# Patient Record
Sex: Female | Born: 1950
Health system: Southern US, Community
[De-identification: ages and names within clinical notes are randomized; demographics above are authoritative.]

## PROBLEM LIST (undated history)

## (undated) DIAGNOSIS — Z789 Other specified health status: Secondary | ICD-10-CM

## (undated) DIAGNOSIS — I639 Cerebral infarction, unspecified: Secondary | ICD-10-CM

## (undated) DIAGNOSIS — Z87442 Personal history of urinary calculi: Secondary | ICD-10-CM

## (undated) DIAGNOSIS — J342 Deviated nasal septum: Secondary | ICD-10-CM

## (undated) DIAGNOSIS — D649 Anemia, unspecified: Secondary | ICD-10-CM

## (undated) DIAGNOSIS — N200 Calculus of kidney: Secondary | ICD-10-CM

## (undated) DIAGNOSIS — E119 Type 2 diabetes mellitus without complications: Secondary | ICD-10-CM

## (undated) DIAGNOSIS — R040 Epistaxis: Secondary | ICD-10-CM

## (undated) HISTORY — DX: Cerebral infarction, unspecified: I63.9

## (undated) HISTORY — PX: OTHER SURGICAL HISTORY: SHX169

## (undated) HISTORY — PX: OVARIAN CYST SURGERY: SHX726

## (undated) HISTORY — DX: Deviated nasal septum: J34.2

## (undated) HISTORY — DX: Type 2 diabetes mellitus without complications: E11.9

---

## 2003-12-26 ENCOUNTER — Emergency Department (HOSPITAL_COMMUNITY): Admission: EM | Admit: 2003-12-26 | Discharge: 2003-12-27 | Payer: Self-pay | Admitting: Emergency Medicine

## 2003-12-28 ENCOUNTER — Other Ambulatory Visit: Admission: RE | Admit: 2003-12-28 | Discharge: 2003-12-28 | Payer: Self-pay | Admitting: Obstetrics and Gynecology

## 2004-02-08 ENCOUNTER — Encounter (INDEPENDENT_AMBULATORY_CARE_PROVIDER_SITE_OTHER): Payer: Self-pay | Admitting: Specialist

## 2004-02-08 ENCOUNTER — Inpatient Hospital Stay (HOSPITAL_COMMUNITY): Admission: RE | Admit: 2004-02-08 | Discharge: 2004-02-10 | Payer: Self-pay | Admitting: Obstetrics and Gynecology

## 2008-04-23 HISTORY — PX: ANKLE SURGERY: SHX546

## 2008-08-26 ENCOUNTER — Ambulatory Visit (HOSPITAL_COMMUNITY): Admission: RE | Admit: 2008-08-26 | Discharge: 2008-08-26 | Payer: Self-pay | Admitting: Internal Medicine

## 2008-11-18 ENCOUNTER — Inpatient Hospital Stay (HOSPITAL_COMMUNITY): Admission: EM | Admit: 2008-11-18 | Discharge: 2008-11-21 | Payer: Self-pay | Admitting: Emergency Medicine

## 2010-01-17 ENCOUNTER — Emergency Department (HOSPITAL_COMMUNITY): Admission: EM | Admit: 2010-01-17 | Discharge: 2010-01-17 | Payer: Self-pay | Admitting: Emergency Medicine

## 2010-07-30 LAB — CBC
HCT: 38.5 % (ref 36.0–46.0)
Hemoglobin: 13.1 g/dL (ref 12.0–15.0)
MCHC: 34 g/dL (ref 30.0–36.0)
MCV: 87.2 fL (ref 78.0–100.0)
Platelets: 229 10*3/uL (ref 150–400)
RBC: 4.41 MIL/uL (ref 3.87–5.11)
RDW: 15.3 % (ref 11.5–15.5)
WBC: 10 10*3/uL (ref 4.0–10.5)

## 2010-07-30 LAB — URINALYSIS, ROUTINE W REFLEX MICROSCOPIC
Bilirubin Urine: NEGATIVE
Glucose, UA: NEGATIVE mg/dL
Hgb urine dipstick: NEGATIVE
Ketones, ur: NEGATIVE mg/dL
Nitrite: NEGATIVE
Protein, ur: NEGATIVE mg/dL
Specific Gravity, Urine: 1.018 (ref 1.005–1.030)
Urobilinogen, UA: 0.2 mg/dL (ref 0.0–1.0)
pH: 6 (ref 5.0–8.0)

## 2010-07-30 LAB — BASIC METABOLIC PANEL
BUN: 12 mg/dL (ref 6–23)
CO2: 23 mEq/L (ref 19–32)
Calcium: 9.3 mg/dL (ref 8.4–10.5)
Chloride: 108 mEq/L (ref 96–112)
Creatinine, Ser: 0.69 mg/dL (ref 0.4–1.2)
GFR calc Af Amer: >60 mL/min (ref >60)
GFR calc non Af Amer: >60 mL/min (ref >60)
Glucose, Bld: 164 mg/dL — ABNORMAL HIGH (ref 70–99)
Potassium: 4 mEq/L (ref 3.5–5.1)
Sodium: 138 mEq/L (ref 135–145)

## 2010-07-30 LAB — DIFFERENTIAL
Basophils Absolute: 0.1 10*3/uL (ref 0.0–0.1)
Basophils Relative: 1 % (ref 0–1)
Eosinophils Absolute: 0.2 10*3/uL (ref 0.0–0.7)
Eosinophils Relative: 2 % (ref 0–5)
Lymphocytes Relative: 15 % (ref 12–46)
Lymphs Abs: 1.5 10*3/uL (ref 0.7–4.0)
Monocytes Absolute: 0.8 10*3/uL (ref 0.1–1.0)
Monocytes Relative: 8 % (ref 3–12)
Neutro Abs: 7.4 10*3/uL (ref 1.7–7.7)
Neutrophils Relative %: 75 % (ref 43–77)

## 2010-07-30 LAB — URINE CULTURE: Colony Count: 50000

## 2010-07-30 LAB — APTT: aPTT: 28 seconds (ref 24–37)

## 2010-07-30 LAB — PROTIME-INR
INR: 0.9 (ref 0.00–1.49)
Prothrombin Time: 12.7 seconds (ref 11.6–15.2)

## 2010-09-05 NOTE — H&P (Signed)
NAMEEZELLA, KELL             ACCOUNT NO.:  [id number]   MEDICAL RECORD NO.:  [id number]          PATIENT TYPE:  INP   LOCATION:  5010                         FACILITY:  [facility]   PHYSICIAN:  Dahlia Bailiff, MD    DATE OF BIRTH:  Sep 11, 1950   DATE OF ADMISSION:  11/18/2008  DATE OF DISCHARGE:                              HISTORY & PHYSICAL   ADMITTING DIAGNOSIS:  Left ankle fracture-dislocation, trimalleolar.   HISTORY:  This is a very pleasant 60 year old woman who was in her usual  state of good-to-excellent health with previous medical history of COPD.  She was at home when she got knocked over by her dog and twisted her  ankle.  She noted immediate pain, deformity, and inability to ambulate.  She was brought to the emergency room and x-rays were done.   X-rays demonstrated trimalleolar ankle fracture dislocation.  Under  local sedation, the ER staff performed a reduction.  Per their report,  there was no evidence of any open wound.   At this point in time, an ortho consultation was requested.   Past medical history includes COPD.  She sleeps with home O2.   She has had previous left wrist surgery.   She denies drug abuse.  She quit smoking within the last 2 months.  She  socially uses alcohol.   ALLERGIES:  She is allergic to PENICILLIN.   Only medication is home oxygen.   CLINICAL EXAM:  GENERAL:  The patient is alert and orientated, in bed  with a left lower extremity splint on.  EXTREMITIES:  She is moving her toes.  Capillary refill less than 2  seconds.  Sensation to light touch is intact.  The proximal calf is soft  and nontender.  She has no knee pain.  Proximal compartment is soft and  nontender.  She has no right lower extremity symptomatology.  LUNGS:  No shortness of breath or chest pain.  ABDOMEN:  Soft, nontender.  NEUROLOGIC:  Cranial nerves II through XII were tested, they are intact.   Post-reduction x-rays demonstrated satisfactory reduction of  the ankle.  There is a small residual posterior fragment as well as the medial and  lateral malleolar fragments.   PLAN:  At this point in time, I have reviewed the x-rays with the  patient and her family and discussed treatment options.  After  discussing both surgical and nonsurgical treatment plans, she has  elected to proceed with surgery.  Risks as I have explained them to her  and her husband and daughter include infection, bleeding, nerve damage,  death, stroke, paralysis, failure to heal, need for further surgery,  ongoing or worse pain, loss of fixation.   Plan is to admit her today for elevation and ice and take her to the  operating room tomorrow for an open reduction and internal fixation.      Dahlia Bailiff, MD  Electronically Signed     DDB/MEDQ  D:  11/18/2008  T:  11/19/2008  Job:  [id number]

## 2010-09-05 NOTE — Op Note (Signed)
Tamara Thompson, Tamara Thompson             ACCOUNT NO.:  [id number]   MEDICAL RECORD NO.:  [id number]          PATIENT TYPE:  INP   LOCATION:  5010                         FACILITY:  [facility]   PHYSICIAN:  Dahlia Bailiff, MD    DATE OF BIRTH:  04/14/1951   DATE OF PROCEDURE:  DATE OF DISCHARGE:                               OPERATIVE REPORT   PREOPERATIVE DIAGNOSIS:  Left trimalleolar ankle fracture and  dislocation.   POSTOPERATIVE DIAGNOSIS:  Left trimalleolar ankle fracture and  dislocation.   OPERATIVE PROCEDURE:  Open reduction and internal fixation of left  ankle.   COMPLICATIONS:  None.   FIRST ASSISTANT:  Karen Kays, PA   HISTORY:  This is a very pleasant 60 year old woman who was knocked down  yesterday evening by her dog and twisted her ankle.  X-rays confirmed a  trimalleolar ankle fracture and dislocation.  It was reduced, splinted,  and I admitted for definitive fracture fixation.  All appropriate risks,  benefits, and alternatives were discussed and consent was obtained.   OPERATIVE NOTE:  The patient was brought to the operating room, placed  supine on the operating table.  After successful induction of general  anesthesia and endotracheal intubation, TEDs and SCDs were applied and  the tourniquet was placed on the proximal left thigh.  Appropriate  preoperative time-out was done confirming patient, procedure, and  extremity that was fractured.  Once done, the left lower extremity was  prepped and draped in a standard fashion.  The leg was exsanguinated and  the tourniquet was inflated.  Total tourniquet time was 1 hour and 48  minutes.   A lateral incision was made along the inferior aspect of the fibula.  The patient was quite thin and I was concerned about an incision  directly lateral and irritation that having the plate irritate the  subcutaneous tissue.  I sharply dissected until I was able to palpate  the posterior margin of the fibula.  I then exposed the  fracture site  and removed all the debris from it.  I excised the enfolded periosteum.  At this point, I could easily reduce the fracture.  There was  significant comminution in bony loss, but I could reduce the two major  fragments.  I then put a posterior-to-anterior lag screw.  This is a 20-  mm length screw.  This is a 3.5 drill on the near cortex and 2.5 drill  on the far cortex.  Once I had the lag screw crossed, I was able to  remove the reduction forceps and maintain an anatomical reduction.  I  then obtained the DePuy small frag locking plate, contoured it, and  secured it to the lateral aspect of the fibula with unicortical screws  distally and four proximal locking screws.  At this point once the  lateral fixation was compete, I irrigated the wound copiously with  normal saline and took intraoperative AP, lateral, and mortise views.  I  had satisfactory reduction of not only the lateral side with hardware  placement, but the posterior fragment also reduced.  I then irrigated  this wound copiously  with normal saline, then closed in a layered  fashion with 0 Vicryl suture, 2-0 Vicryl suture, and a 3-0 Prolene  vertical running mattress.  With the lateral side addressed, I then  turned my attention to the medial side.  A reverse hockey stick incision  was made and sharp dissection was carried out down to the bone.  I  sharply dissected, so I could see the axilla, the anterior margin of the  joint space.  I then again removed significant comminuted portions of  bone and excised the enfolded deltoid ligament and periosteum.  At this  point, I could reduce the fracture fragment of the medial malleolus and  placed two guide pins across it.  I had satisfactory position in AP,  lateral, and mortise views, and so I drilled and placed two screws, more  anterior, it was 46 mm in length and in the posterior, it was 50 mm in  length.  I had excellent fixation.  Clinically, direct evaluation I  had  bone-to-bone apposition.   There were areas of lucency due to bone loss in comminution.  At this  point, I took final AP, lateral, and mortise views and I had  satisfactory reduction and hardware placement in all planes.   I then closed with interrupted 2-0 Vicryl sutures and interrupted simple  Prolene sutures 2-0.  At this point, I had an excellent closure and then  cleaned the skin.  I then placed a bulky dry dressing with a posterior  splint with side struts.  The patient was extubated and transferred to  the PACU without incident.  At the end of the case, all needles and  sponge counts were correct.  The patient tolerated the procedure well.      Dahlia Bailiff, MD  Electronically Signed     DDB/MEDQ  D:  11/19/2008  T:  11/20/2008  Job:  [id number]
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
## 2010-09-08 NOTE — Op Note (Signed)
[name], Tamara Thompson             ACCOUNT NO.:  [id number]   MEDICAL RECORD NO.:  [id number]          PATIENT TYPE:  INP   LOCATION:  5409                         FACILITY:  [facility]   PHYSICIAN:  Clarene Duke, M.D.DATE OF BIRTH:  [birth year]   DATE OF PROCEDURE:  02/08/2004  DATE OF DISCHARGE:                                 OPERATIVE REPORT   PREOPERATIVE DIAGNOSIS:  Pelvic mass.   POSTOPERATIVE DIAGNOSIS:  Pelvic mass which is a right ovarian mature  teratoma.   PROCEDURE:  Exploratory laparotomy and right salpingo-oophorectomy.   SURGEON:  Cheri Fowler, MD   ASSISTANT:  Newton Pigg, MD   ANESTHESIA:  General endotracheal tube.   ESTIMATED BLOOD LOSS:  50 mL.   FINDINGS:  She had a 10 cm right ovarian mass which was a benign mature  teratoma by frozen section.  She had some small, whitish nodules on her left  ovary which were also removed.  The remainder of her anatomy was normal with  normal uterus and upper abdomen.   PROCEDURE IN DETAIL:  The patient was taken to the operating room and placed  in the dorsal supine position.  General anesthesia was induced, and she was  placed in mobile stirrups.  Abdomen, perineum, and vagina were prepped and  draped in the usual sterile fashion and a Foley catheter inserted.  Abdomen  was then entered via a vertical skin incision from just above the pubic  symphysis to about two-thirds of the way up to the umbilicus.  This was  carried down through the fascia which was incised.  Peritoneum was entered  bluntly and extended superiorly and inferiorly.  Washings were obtained.  Palpation of the upper abdomen was normal.  Palpation of the pelvis just  revealed this 10 cm mass.  Very delicately the mass was delivered through  the incision.  This did require extending the incision approximately 2-3 cm.  Once the mass was elevated, it was found to be the right ovary.  A Zepplin  clamp was used to isolate the ovary and the distal right  fallopian tube  which had some whitish nodules on it.  This was then removed sharply and  sent for frozen section.  The pedicle was doubly ligated with #1 chromic  with adequate hemostasis.  Inspection revealed two whitish nodules on the  left ovary which were removed with electrocautery and will be sent as  permanent specimens.  The remainder of her pelvic anatomy was normal.  The  pedicle was again inspected and found to be hemostatic.  The fascia and  peritoneum were then closed in bulk with running 0 PDS, starting at both  ends and meeting in the middle.  Subcutaneous tissue was made hemostatic  with electrocautery and closed with running 2-0 plain gut suture.  Skin was  then closed with staples.  While closing the skin, the frozen section came  back consistent with a benign ovarian mature teratoma.  Sterile dressing was  then  placed.  The patient tolerated the procedure well, was extubated in the  operating room and taken to the recovery room in  stable condition.  Counts  were correct x 2.  She had PAS throughout the procedure and received Mefoxin  1 g prior to the procedure.     Todd   TDM/MEDQ  D:  02/08/2004  T:  02/08/2004  Job:  [id number]

## 2010-09-08 NOTE — Discharge Summary (Signed)
NAMETREASA, Thompson             ACCOUNT NO.:  [id number]   MEDICAL RECORD NO.:  [id number]          PATIENT TYPE:  INP   LOCATION:  2376                         [facility]ACILITY:  [facility]   PHYSICIAN:  Clarene Duke, M.D.DATE OF BIRTH:  09/27/1950   DATE OF ADMISSION:  02/08/2004  DATE OF DISCHARGE:  02/10/2004                                 DISCHARGE SUMMARY   ADMISSION DIAGNOSIS:  Pelvic mass.   DISCHARGE DIAGNOSIS:  Right ovarian mature teratoma.   PROCEDURE:  On February 08, 2004, she had an exploratory laparotomy with  right salpingo-oophorectomy.   HISTORY AND PHYSICAL:  Please see chart for full history and physical, but  briefly this is a 59 year old white female, para 2-0-0-2, who is referred  from the emergency department after presenting for abdominal pain and had a  CT scan that revealed a 10 x 8 cm right ovarian mass.  Physical exam  revealed a benign abdomen, with a well-healed transverse scar from a  previous cesarean section.  On pelvic exam, she had an 8-10 cm right adnexal  mass.   HOSPITAL COURSE:  The patient was admitted on the day of surgery and  underwent exploratory laparotomy with right salpingo-oophorectomy under  general anesthesia.  Estimated blood loss was 50 cc.  She did have a 10 cm  right ovarian mass that by frozen section was a benign mature teratoma.  Postoperatively, she had no significant complications.  Preoperative  hemoglobin 15.1, postoperative 12.2.  On postoperative day two, she was felt  to be stable enough for discharge home.  Her incision was healing well.   DISCHARGE INSTRUCTIONS:  Regular diet.  No strenuous activity.   FOLLOWUP:  Is in four days for staple removal.   MEDICATIONS:  1.  Percocet, #40, 1-2 p.o. q.4-6 h. p.r.n. pain.  2.  Over-the-counter ibuprofen as needed.     Todd   TDM/MEDQ  D:  02/10/2004  T:  02/10/2004  Job:  [id number]

## 2012-02-28 ENCOUNTER — Emergency Department (HOSPITAL_COMMUNITY)
Admission: EM | Admit: 2012-02-28 | Discharge: 2012-02-28 | Disposition: A | Discharge status: Home / Self Care | Payer: Self-pay | Attending: Emergency Medicine | Admitting: Emergency Medicine

## 2012-02-28 ENCOUNTER — Encounter (HOSPITAL_COMMUNITY): Payer: Self-pay

## 2012-02-28 DIAGNOSIS — Z87891 Personal history of nicotine dependence: Secondary | ICD-10-CM | POA: Insufficient documentation

## 2012-02-28 DIAGNOSIS — R040 Epistaxis: Secondary | ICD-10-CM | POA: Insufficient documentation

## 2012-02-28 HISTORY — DX: Epistaxis: R04.0

## 2012-02-28 MED ORDER — OXYMETAZOLINE HCL 0.05 % NA SOLN
NASAL | Status: AC
  Administered 2012-02-28: 1 via NASAL
  Filled 2012-02-28: qty 15

## 2012-02-28 MED ORDER — OXYMETAZOLINE HCL 0.05 % NA SOLN
1.0000 | Freq: Once | NASAL | Status: AC
  Administered 2012-02-28: 1 via NASAL

## 2012-02-28 NOTE — ED Notes (Signed)
Pt states nose bleed x90 minutes. At this time pt advised to hold pressure on nose. No active bleeding noted on my initial assessment. Pt states nose had been continuously bleeding up until I came into room. No other complaints, denies any blood thinning or antiplatelet medications.

## 2012-02-28 NOTE — ED Notes (Signed)
Nose started bleeding over an hour ago per pt. Bleeding from the right side of nose. History of nosebleeds, not on blood thinners per pt.

## 2012-02-28 NOTE — ED Provider Notes (Signed)
History  This chart was scribed for Veryl Speak, MD by Mikel Cella. This patient was seen in room APA19/APA19 and the patient's care was started at 2007.  CSN: [id number]  Arrival date & time 02/28/12  1955   First MD Initiated Contact with Patient 02/28/12 2007      Chief Complaint  Patient presents with  . Epistaxis     The history is provided by the patient. No language interpreter was used.    Tamara Thompson is a 61 y.o. female who presents to the Emergency Department complaining of epistaxis from her right nostral. She states that the bleeding began an hour and a half ago when she was watching TV and it stopped 15 minutes before she arrived at the ED. She denies any injury or trauma to the area. She denies taking blood thinners. She has a history of epistaxis, but states that none of her previous episodes have been this severe. She is a former smoker but denies alcohol use.    Past Medical History  Diagnosis Date  . Bleeding from the nose     Past Surgical History  Procedure Date  . Ovarian cyst surgery   . Ankle surgery     No family history on file.  History  Substance Use Topics  . Smoking status: Former Research scientist (life sciences)  . Smokeless tobacco: Not on file  . Alcohol Use: No   No OB history available.   Review of Systems  All other systems reviewed and are negative.  A complete 10 system review of systems was obtained and all systems are negative except as noted in the HPI and PMH.    Allergies  Penicillins  Home Medications  No current outpatient prescriptions on file.  Triage Vitals: BP 152/98  Pulse 131  Ht 5' 5"  (1.651 m)  Wt 175 lb (79.379 kg)  BMI 29.12 kg/m2  SpO2 100%  Physical Exam  Nursing note and vitals reviewed. Constitutional: She is oriented to person, place, and time. She appears well-developed and well-nourished. No distress.  HENT:  Head: Normocephalic and atraumatic.       Dried blood in right naris, no active bleed. Septum  midline, mucosa normal  Eyes: EOM are normal. Pupils are equal, round, and reactive to light.  Neck: Normal range of motion. Neck supple. No tracheal deviation present.  Cardiovascular: Normal rate.   Pulmonary/Chest: Effort normal. No respiratory distress.  Abdominal: Soft. She exhibits no distension.  Musculoskeletal: Normal range of motion. She exhibits no edema.  Neurological: She is alert and oriented to person, place, and time.  Skin: Skin is warm and dry.  Psychiatric: She has a normal mood and affect. Her behavior is normal.    ED Course  Procedures (including critical care time)  DIAGNOSTIC STUDIES: Oxygen Saturation is 100% on room air, normal by my interpretation.    COORDINATION OF CARE:  8:19 PM: Discussed treatment plan which includes a vasoconstrictor with pt at bedside and pt agreed to plan.    Labs Reviewed - No data to display No results found.   No diagnosis found.    MDM  Patient given oxymetazoline nasal spray and no further bleeding has occurred.  Will discharge to home, to return prn if worsens.      I personally performed the services described in this documentation, which was scribed in my presence. The recorded information has been reviewed and is accurate.     Veryl Speak, MD 02/28/12 2105

## 2013-04-12 ENCOUNTER — Encounter (HOSPITAL_COMMUNITY): Payer: Self-pay | Admitting: Emergency Medicine

## 2013-04-12 ENCOUNTER — Emergency Department (HOSPITAL_COMMUNITY)
Admission: EM | Admit: 2013-04-12 | Discharge: 2013-04-13 | Disposition: A | Discharge status: Home / Self Care | Payer: BC Managed Care – PPO | Attending: Emergency Medicine | Admitting: Emergency Medicine

## 2013-04-12 ENCOUNTER — Emergency Department (HOSPITAL_COMMUNITY): Payer: BC Managed Care – PPO

## 2013-04-12 DIAGNOSIS — Z87891 Personal history of nicotine dependence: Secondary | ICD-10-CM | POA: Insufficient documentation

## 2013-04-12 DIAGNOSIS — Z880 Allergy status to penicillin: Secondary | ICD-10-CM | POA: Insufficient documentation

## 2013-04-12 DIAGNOSIS — J209 Acute bronchitis, unspecified: Secondary | ICD-10-CM | POA: Insufficient documentation

## 2013-04-12 DIAGNOSIS — R0789 Other chest pain: Secondary | ICD-10-CM | POA: Insufficient documentation

## 2013-04-12 DIAGNOSIS — J0100 Acute maxillary sinusitis, unspecified: Secondary | ICD-10-CM

## 2013-04-12 DIAGNOSIS — J40 Bronchitis, not specified as acute or chronic: Secondary | ICD-10-CM

## 2013-04-12 MED ORDER — HYDROCOD POLST-CHLORPHEN POLST 10-8 MG/5ML PO LQCR
5.0000 mL | Freq: Once | ORAL | Status: AC
  Administered 2013-04-12: 5 mL via ORAL
  Filled 2013-04-12: qty 5

## 2013-04-12 MED ORDER — ALBUTEROL SULFATE (5 MG/ML) 0.5% IN NEBU
5.0000 mg | INHALATION_SOLUTION | Freq: Once | RESPIRATORY_TRACT | Status: AC
  Administered 2013-04-12: 5 mg via RESPIRATORY_TRACT
  Filled 2013-04-12: qty 1

## 2013-04-12 MED ORDER — IPRATROPIUM BROMIDE 0.02 % IN SOLN
0.5000 mg | Freq: Once | RESPIRATORY_TRACT | Status: AC
  Administered 2013-04-12: 0.5 mg via RESPIRATORY_TRACT
  Filled 2013-04-12: qty 2.5

## 2013-04-12 NOTE — ED Notes (Signed)
Pt c/o lungs hurting, cough, congestion, and fever. Pt states she theses symptoms started last Tuesday and she got better and then today she started feeling bad again.

## 2013-04-13 MED ORDER — HYDROCOD POLST-CHLORPHEN POLST 10-8 MG/5ML PO LQCR: 5.0000 mL | Freq: Two times a day (BID) | ORAL | Status: DC

## 2013-04-13 MED ORDER — AZITHROMYCIN 250 MG PO TABS
500.0000 mg | ORAL_TABLET | Freq: Once | ORAL | Status: AC
  Administered 2013-04-13: 500 mg via ORAL
  Filled 2013-04-13: qty 2

## 2013-04-13 MED ORDER — AZITHROMYCIN 250 MG PO TABS: 250.0000 mg | ORAL_TABLET | Freq: Every day | ORAL | Status: DC

## 2013-04-13 MED ORDER — ALBUTEROL SULFATE (2.5 MG/3ML) 0.083% IN NEBU: 2.5000 mg | INHALATION_SOLUTION | RESPIRATORY_TRACT | Status: DC | PRN

## 2013-04-13 NOTE — ED Notes (Signed)
Family at bedside. Patient states that her chest pain is minimal with the help of breathing treatment and cough syrup.

## 2013-04-14 NOTE — ED Provider Notes (Signed)
CSN: [id number]     Arrival date & time 04/12/13  2111 History   First MD Initiated Contact with Patient 04/12/13 2238     Chief Complaint  Patient presents with  . URI   (Consider location/radiation/quality/duration/timing/severity/associated sxs/prior Treatment) HPI Comments: Tamara Thompson is a 62 y.o. Female presenting with a 5 day history of uri type symptoms which includes nasal congestion with thick copious post nasal drip, facial pain, sore throat, low grade fever and nonproductive cough which triggers a deep burning sensation in her chest, although she describes chest tightness at baseline.  She also experiences wheezing, worsened at night when trying to sleep in addition to shortness of breath which is worse with exertion.  Symptoms due to not include  nausea, vomiting or diarrhea.  The patient has taken multiple otc cough and cold remedies without improvement.  The history is provided by the patient.    Past Medical History  Diagnosis Date  . Bleeding from the nose    Past Surgical History  Procedure Laterality Date  . Ovarian cyst surgery    . Ankle surgery     History reviewed. No pertinent family history. History  Substance Use Topics  . Smoking status: Former Research scientist (life sciences)  . Smokeless tobacco: Not on file  . Alcohol Use: No   OB History   Grav Para Term Preterm Abortions TAB SAB Ect Mult Living                 Review of Systems  Constitutional: Positive for fever and chills.  HENT: Positive for congestion and sore throat. Negative for ear pain, postnasal drip, sinus pressure, trouble swallowing and voice change.   Eyes: Negative for discharge.  Respiratory: Positive for cough, chest tightness, shortness of breath and wheezing. Negative for stridor.   Cardiovascular: Positive for chest pain.       Burning pain with cough episodes  Gastrointestinal: Negative for abdominal pain.  Genitourinary: Negative.     Allergies  Penicillins  Home Medications   Current  Outpatient Rx  Name  Route  Sig  Dispense  Refill  . albuterol (PROVENTIL) (2.5 MG/3ML) 0.083% nebulizer solution   Nebulization   Take 3 mLs (2.5 mg total) by nebulization every 4 (four) hours as needed for wheezing or shortness of breath.   75 mL   0   . azithromycin (ZITHROMAX Z-PAK) 250 MG tablet   Oral   Take 1 tablet (250 mg total) by mouth daily.   4 tablet   0   . chlorpheniramine-HYDROcodone (TUSSIONEX PENNKINETIC ER) 10-8 MG/5ML LQCR   Oral   Take 5 mLs by mouth 2 (two) times daily.   75 mL   0    BP 113/73  Pulse 99  Temp(Src) 100 F (37.8 C) (Oral)  Resp 20  Ht 5' 5"  (1.651 m)  Wt 162 lb (73.483 kg)  BMI 26.96 kg/m2  SpO2 97% Physical Exam  Nursing note and vitals reviewed. Constitutional: She appears well-developed and well-nourished.  HENT:  Head: Normocephalic and atraumatic.  Right Ear: Tympanic membrane normal.  Left Ear: Tympanic membrane normal.  Nose: Mucosal edema present. Right sinus exhibits maxillary sinus tenderness. Left sinus exhibits maxillary sinus tenderness.  Mouth/Throat: Uvula is midline and oropharynx is clear and moist.  Eyes: Conjunctivae are normal.  Neck: Normal range of motion.  Cardiovascular: Normal rate, regular rhythm, normal heart sounds and intact distal pulses.   Pulmonary/Chest: Effort normal. She has decreased breath sounds. She has wheezes. She has no rhonchi.  She has no rales.  Decreased breath sounds throughout all lung fields. Faint expiratory wheeze at right base.  Abdominal: Soft. Bowel sounds are normal. There is no tenderness.  Musculoskeletal: Normal range of motion.  Neurological: She is alert.  Skin: Skin is warm and dry.  Psychiatric: She has a normal mood and affect.    ED Course  Procedures (including critical care time) Labs Review Labs Reviewed - No data to display Imaging Review Dg Chest 2 View  04/12/2013   CLINICAL DATA:  Cough, fever, chest pain.  EXAM: CHEST - 2 VIEW  COMPARISON:  11/18/2008  and earlier studies  FINDINGS: Lungs are clear. Heart size and mediastinal contours are within normal limits. Tortuous thoracic aorta. No pneumothorax. No effusion. Visualized skeletal structures are unremarkable.  IMPRESSION: No acute cardiopulmonary disease.   Electronically Signed   By: Arne Cleveland M.D.   On: 04/12/2013 21:42    EKG Interpretation   None       MDM   1. Bronchitis   2. Sinusitis, acute maxillary    Pt was given albuterol and atrovent neb while here with improvement in aeration and symptoms after tx. No further wheezing heard on re-exam.  Also given tussionex which helped to slow the cough.  Pt was prescribed additional tussionex, albuterol for home use (has a neb at home, husband with copd, encouraged pt to take the tubing used here for home use).  Also prescribed zithromax - suspect sinusitis with nasal sx and maxillary tenderness.   Referrals given for establishing pcp, Dr. Luan Pulling recommended to pt.  In interim, encouraged rest, increased fluid intake.  Return for any worsened sx.    Evalee Jefferson, PA-C 04/14/13 1301

## 2013-04-15 NOTE — ED Provider Notes (Signed)
Medical screening examination/treatment/procedure(s) were performed by non-physician practitioner and as supervising physician I was immediately available for consultation/collaboration.     Veryl Speak, MD 04/15/13 [phone]

## 2013-11-12 ENCOUNTER — Other Ambulatory Visit (HOSPITAL_COMMUNITY): Payer: Self-pay | Admitting: Internal Medicine

## 2013-11-12 DIAGNOSIS — Z0000 Encounter for general adult medical examination without abnormal findings: Secondary | ICD-10-CM

## 2013-11-16 ENCOUNTER — Ambulatory Visit (HOSPITAL_COMMUNITY)
Admission: RE | Admit: 2013-11-16 | Discharge: 2013-11-16 | Disposition: A | Discharge status: Home / Self Care | Payer: BC Managed Care – PPO | Source: Ambulatory Visit | Attending: Internal Medicine | Admitting: Internal Medicine

## 2013-11-16 DIAGNOSIS — Z1231 Encounter for screening mammogram for malignant neoplasm of breast: Secondary | ICD-10-CM | POA: Insufficient documentation

## 2013-11-16 DIAGNOSIS — Z0000 Encounter for general adult medical examination without abnormal findings: Secondary | ICD-10-CM

## 2013-11-18 ENCOUNTER — Telehealth: Payer: Self-pay

## 2013-11-18 NOTE — Telephone Encounter (Signed)
Please return patient's call at [phone]. She received a triage letter from [facility]

## 2013-11-19 ENCOUNTER — Other Ambulatory Visit: Payer: Self-pay

## 2013-11-19 DIAGNOSIS — Z1211 Encounter for screening for malignant neoplasm of colon: Secondary | ICD-10-CM

## 2013-11-19 NOTE — Telephone Encounter (Signed)
Pt called stating she has been trying to call Doris to schedule her colonoscopy I made pt aware that Tamela Oddi is taking patients back and I can send her a message to have her return her call. Please advise [phone]

## 2013-11-20 ENCOUNTER — Other Ambulatory Visit: Payer: Self-pay | Admitting: Internal Medicine

## 2013-11-20 DIAGNOSIS — R928 Other abnormal and inconclusive findings on diagnostic imaging of breast: Secondary | ICD-10-CM

## 2013-11-23 NOTE — Telephone Encounter (Signed)
Gastroenterology Pre-Procedure Review  Request Date: 11/18/2013 Requesting Physician: Dr. Gerarda Fraction  PATIENT REVIEW QUESTIONS: The patient responded to the following health history questions as indicated:    1. Diabetes Melitis: no 2. Joint replacements in the past 12 months: no 3. Major health problems in the past 3 months: no 4. Has an artificial valve or MVP: no 5. Has a defibrillator: no 6. Has been advised in past to take antibiotics in advance of a procedure like teeth cleaning: no    MEDICATIONS & ALLERGIES:    Patient reports the following regarding taking any blood thinners:   Plavix? no Aspirin? no Coumadin? no  Patient confirms/reports the following medications:  Current Outpatient Prescriptions  Medication Sig Dispense Refill  . acetaminophen (TYLENOL) 500 MG tablet Take 500 mg by mouth every 6 (six) hours as needed. Pt said she uses Tylenol very rarely       No current facility-administered medications for this visit.    Patient confirms/reports the following allergies:  Allergies  Allergen Reactions  . Penicillins     Unknown Reaction    No orders of the defined types were placed in this encounter.    AUTHORIZATION INFORMATION Primary Insurance:   ID #:   Group #:  Pre-Cert / Auth required:  Pre-Cert / Auth #:   Secondary Insurance:   ID #:   Group #:  Pre-Cert / Auth required:  Pre-Cert / Auth #:   SCHEDULE INFORMATION: Procedure has been scheduled as follows:  Date: 12/14/2013                    Time:  10:30 AM Location: [facility] Short Stay  This Gastroenterology Pre-Precedure Review Form is being routed to the following provider(s): Barney Drain, MD

## 2013-11-26 ENCOUNTER — Encounter (HOSPITAL_COMMUNITY): Payer: Self-pay

## 2013-11-27 NOTE — Telephone Encounter (Signed)
PREPOPIK-DRINK WATER TO KEEP URINE LIGHT YELLOW.  PT SHOULD DROP OFF RX 3 DAYS PRIOR TO PROCEDURE.

## 2013-11-30 MED ORDER — PEG 3350-KCL-NA BICARB-NACL 420 G PO SOLR: 4000.0000 mL | ORAL | Status: DC

## 2013-11-30 NOTE — Telephone Encounter (Signed)
Rx sent to the pharmacy for Trilyte. Prepopik not covered by insurance. Instructions mailed to pt.

## 2013-11-30 NOTE — Addendum Note (Signed)
Addended by: Everardo All on: 11/30/2013 10:27 AM   Modules accepted: Orders

## 2013-12-03 ENCOUNTER — Ambulatory Visit (INDEPENDENT_AMBULATORY_CARE_PROVIDER_SITE_OTHER): Payer: BC Managed Care – PPO | Admitting: Otolaryngology

## 2013-12-03 DIAGNOSIS — J342 Deviated nasal septum: Secondary | ICD-10-CM

## 2013-12-03 DIAGNOSIS — J343 Hypertrophy of nasal turbinates: Secondary | ICD-10-CM

## 2013-12-03 DIAGNOSIS — J310 Chronic rhinitis: Secondary | ICD-10-CM

## 2013-12-08 ENCOUNTER — Other Ambulatory Visit: Payer: Self-pay | Admitting: Internal Medicine

## 2013-12-08 ENCOUNTER — Ambulatory Visit (HOSPITAL_COMMUNITY)
Admission: RE | Admit: 2013-12-08 | Discharge: 2013-12-08 | Disposition: A | Discharge status: Home / Self Care | Payer: BC Managed Care – PPO | Source: Ambulatory Visit | Attending: Internal Medicine | Admitting: Internal Medicine

## 2013-12-08 DIAGNOSIS — R928 Other abnormal and inconclusive findings on diagnostic imaging of breast: Secondary | ICD-10-CM

## 2013-12-08 DIAGNOSIS — N63 Unspecified lump in unspecified breast: Secondary | ICD-10-CM | POA: Diagnosis not present

## 2013-12-08 DIAGNOSIS — N6489 Other specified disorders of breast: Secondary | ICD-10-CM | POA: Insufficient documentation

## 2013-12-14 ENCOUNTER — Ambulatory Visit (HOSPITAL_COMMUNITY)
Admission: RE | Admit: 2013-12-14 | Discharge: 2013-12-14 | Disposition: A | Discharge status: Home / Self Care | Payer: BC Managed Care – PPO | Source: Ambulatory Visit | Attending: Gastroenterology | Admitting: Gastroenterology

## 2013-12-14 ENCOUNTER — Encounter (HOSPITAL_COMMUNITY)
Admission: RE | Disposition: A | Discharge status: Home / Self Care | Payer: Self-pay | Source: Ambulatory Visit | Attending: Gastroenterology

## 2013-12-14 ENCOUNTER — Encounter (HOSPITAL_COMMUNITY): Payer: Self-pay | Admitting: *Deleted

## 2013-12-14 DIAGNOSIS — Z1211 Encounter for screening for malignant neoplasm of colon: Secondary | ICD-10-CM | POA: Diagnosis not present

## 2013-12-14 DIAGNOSIS — K621 Rectal polyp: Secondary | ICD-10-CM | POA: Diagnosis not present

## 2013-12-14 DIAGNOSIS — D126 Benign neoplasm of colon, unspecified: Secondary | ICD-10-CM | POA: Insufficient documentation

## 2013-12-14 DIAGNOSIS — K5730 Diverticulosis of large intestine without perforation or abscess without bleeding: Secondary | ICD-10-CM | POA: Diagnosis not present

## 2013-12-14 DIAGNOSIS — K620 Anal polyp: Secondary | ICD-10-CM | POA: Insufficient documentation

## 2013-12-14 DIAGNOSIS — Z87891 Personal history of nicotine dependence: Secondary | ICD-10-CM | POA: Insufficient documentation

## 2013-12-14 DIAGNOSIS — Q438 Other specified congenital malformations of intestine: Secondary | ICD-10-CM | POA: Insufficient documentation

## 2013-12-14 HISTORY — PX: COLONOSCOPY: SHX5424

## 2013-12-14 HISTORY — DX: Other specified health status: Z78.9

## 2013-12-14 SURGERY — COLONOSCOPY
Anesthesia: Moderate Sedation

## 2013-12-14 MED ORDER — SODIUM CHLORIDE 0.9 % IV SOLN
INTRAVENOUS | Status: DC
  Administered 2013-12-14: 10:00:00 via INTRAVENOUS

## 2013-12-14 MED ORDER — MIDAZOLAM HCL 5 MG/5ML IJ SOLN
INTRAMUSCULAR | Status: AC
  Filled 2013-12-14: qty 10

## 2013-12-14 MED ORDER — MEPERIDINE HCL 100 MG/ML IJ SOLN
INTRAMUSCULAR | Status: DC | PRN
  Administered 2013-12-14 (×2): 50 mg via INTRAVENOUS
  Administered 2013-12-14: 25 mg via INTRAVENOUS

## 2013-12-14 MED ORDER — STERILE WATER FOR IRRIGATION IR SOLN
Status: DC | PRN
  Administered 2013-12-14: 10:00:00

## 2013-12-14 MED ORDER — MEPERIDINE HCL 100 MG/ML IJ SOLN
INTRAMUSCULAR | Status: AC
  Filled 2013-12-14: qty 2

## 2013-12-14 MED ORDER — MIDAZOLAM HCL 5 MG/5ML IJ SOLN
INTRAMUSCULAR | Status: DC | PRN
  Administered 2013-12-14 (×2): 2 mg via INTRAVENOUS
  Administered 2013-12-14: 1 mg via INTRAVENOUS

## 2013-12-14 NOTE — Discharge Instructions (Signed)
You had 10 small polyp removed. You have internal hemorrhoids and diverticulosis IN YOUR RIGHT AND LEFT COLON. YOU HAVE A FLOPPY LEFT COLON.   No MRI for 30 days.  FOLLOW A HIGH FIBER DIET. AVOID ITEMS THAT CAUSE BLOATING. SEE INFO BELOW.  YOUR BIOPSY RESULTS SHOULD BE BACK IN 7 DAYS.  Next colonoscopy in 1-3 years WITH AN OVERTUBE.   Colonoscopy Care After Read the instructions outlined below and refer to this sheet in the next week. These discharge instructions provide you with general information on caring for yourself after you leave the hospital. While your treatment has been planned according to the most current medical practices available, unavoidable complications occasionally occur. If you have any problems or questions after discharge, call DR. Drina Jobst, [phone].  ACTIVITY  You may resume your regular activity, but move at a slower pace for the next 24 hours.   Take frequent rest periods for the next 24 hours.   Walking will help get rid of the air and reduce the bloated feeling in your belly (abdomen).   No driving for 24 hours (because of the medicine (anesthesia) used during the test).   You may shower.   Do not sign any important legal documents or operate any machinery for 24 hours (because of the anesthesia used during the test).    NUTRITION  Drink plenty of fluids.   You may resume your normal diet as instructed by your doctor.   Begin with a light meal and progress to your normal diet. Heavy or fried foods are harder to digest and may make you feel sick to your stomach (nauseated).   Avoid alcoholic beverages for 24 hours or as instructed.    MEDICATIONS  You may resume your normal medications.   WHAT YOU CAN EXPECT TODAY  Some feelings of bloating in the abdomen.   Passage of more gas than usual.   Spotting of blood in your stool or on the toilet paper  .  IF YOU HAD POLYPS REMOVED DURING THE COLONOSCOPY:  Eat a soft diet IF YOU HAVE  NAUSEA, BLOATING, ABDOMINAL PAIN, OR VOMITING.    FINDING OUT THE RESULTS OF YOUR TEST Not all test results are available during your visit. DR. Oneida Alar WILL CALL YOU WITHIN 7 DAYS OF YOUR PROCEDUE WITH YOUR RESULTS. Do not assume everything is normal if you have not heard from DR. Mazey Mantell IN ONE WEEK, CALL HER OFFICE AT [phone].  SEEK IMMEDIATE MEDICAL ATTENTION AND CALL THE OFFICE: [phone] IF:  You have more than a spotting of blood in your stool.   Your belly is swollen (abdominal distention).   You are nauseated or vomiting.   You have a temperature over 101F.   You have abdominal pain or discomfort that is severe or gets worse throughout the day.  Polyps, Colon  A polyp is extra tissue that grows inside your body. Colon polyps grow in the large intestine. The large intestine, also called the colon, is part of your digestive system. It is a long, hollow tube at the end of your digestive tract where your body makes and stores stool. Most polyps are not dangerous. They are benign. This means they are not cancerous. But over time, some types of polyps can turn into cancer. Polyps that are smaller than a pea are usually not harmful. But larger polyps could someday become or may already be cancerous. To be safe, doctors remove all polyps and test them.   WHO GETS POLYPS? Anyone can get  polyps, but certain people are more likely than others. You may have a greater chance of getting polyps if:  You are over 50.   You have had polyps before.   Someone in your family has had polyps.   Someone in your family has had cancer of the large intestine.   Find out if someone in your family has had polyps. You may also be more likely to get polyps if you:   Eat a lot of fatty foods   Smoke   Drink alcohol   Do not exercise  Eat too much   TREATMENT  The caregiver will remove the polyp during sigmoidoscopy or colonoscopy.  PREVENTION There is not one sure way to prevent polyps.  You might be able to lower your risk of getting them if you:  Eat more fruits and vegetables and less fatty food.   Do not smoke.   Avoid alcohol.   Exercise every day.   Lose weight if you are overweight.   Eating more calcium and folate can also lower your risk of getting polyps. Some foods that are rich in calcium are milk, cheese, and broccoli. Some foods that are rich in folate are chickpeas, kidney beans, and spinach.   High-Fiber Diet A high-fiber diet changes your normal diet to include more whole grains, legumes, fruits, and vegetables. Changes in the diet involve replacing refined carbohydrates with unrefined foods. The calorie level of the diet is essentially unchanged. The Dietary Reference Intake (recommended amount) for adult males is 38 grams per day. For adult females, it is 25 grams per day. Pregnant and lactating women should consume 28 grams of fiber per day. Fiber is the intact part of a plant that is not broken down during digestion. Functional fiber is fiber that has been isolated from the plant to provide a beneficial effect in the body. PURPOSE  Increase stool bulk.   Ease and regulate bowel movements.   Lower cholesterol.  INDICATIONS THAT YOU NEED MORE FIBER  Constipation and hemorrhoids.   Uncomplicated diverticulosis (intestine condition) and irritable bowel syndrome.   Weight management.   As a protective measure against hardening of the arteries (atherosclerosis), diabetes, and cancer.   GUIDELINES FOR INCREASING FIBER IN THE DIET  Start adding fiber to the diet slowly. A gradual increase of about 5 more grams (2 slices of whole-wheat bread, 2 servings of most fruits or vegetables, or 1 bowl of high-fiber cereal) per day is best. Too rapid an increase in fiber may result in constipation, flatulence, and bloating.   Drink enough water and fluids to keep your urine clear or pale yellow. Water, juice, or caffeine-free drinks are recommended. Not  drinking enough fluid may cause constipation.   Eat a variety of high-fiber foods rather than one type of fiber.   Try to increase your intake of fiber through using high-fiber foods rather than fiber pills or supplements that contain small amounts of fiber.   The goal is to change the types of food eaten. Do not supplement your present diet with high-fiber foods, but replace foods in your present diet.  INCLUDE A VARIETY OF FIBER SOURCES  Replace refined and processed grains with whole grains, canned fruits with fresh fruits, and incorporate other fiber sources. White rice, white breads, and most bakery goods contain little or no fiber.   Brown whole-grain rice, buckwheat oats, and many fruits and vegetables are all good sources of fiber. These include: broccoli, Brussels sprouts, cabbage, cauliflower, beets, sweet potatoes,  white potatoes (skin on), carrots, tomatoes, eggplant, squash, berries, fresh fruits, and dried fruits.   Cereals appear to be the richest source of fiber. Cereal fiber is found in whole grains and bran. Bran is the fiber-rich outer coat of cereal grain, which is largely removed in refining. In whole-grain cereals, the bran remains. In breakfast cereals, the largest amount of fiber is found in those with "bran" in their names. The fiber content is sometimes indicated on the label.   You may need to include additional fruits and vegetables each day.   In baking, for 1 cup white flour, you may use the following substitutions:   1 cup whole-wheat flour minus 2 tablespoons.   1/2 cup white flour plus 1/2 cup whole-wheat flour.   Diverticulosis Diverticulosis is a common condition that develops when small pouches (diverticula) form in the wall of the colon. The risk of diverticulosis increases with age. It happens more often in people who eat a low-fiber diet. Most individuals with diverticulosis have no symptoms. Those individuals with symptoms usually experience belly  (abdominal) pain, constipation, or loose stools (diarrhea).  HOME CARE INSTRUCTIONS  Increase the amount of fiber in your diet as directed by your caregiver or dietician. This may reduce symptoms of diverticulosis.   Drink at least 6 to 8 glasses of water each day to prevent constipation.   Try not to strain when you have a bowel movement.   Avoiding nuts and seeds to prevent complications is still an uncertain benefit.       FOODS HAVING HIGH FIBER CONTENT INCLUDE:  Fruits. Apple, peach, pear, tangerine, raisins, prunes.   Vegetables. Brussels sprouts, asparagus, broccoli, cabbage, carrot, cauliflower, romaine lettuce, spinach, summer squash, tomato, winter squash, zucchini.   Starchy Vegetables. Baked beans, kidney beans, lima beans, split peas, lentils, potatoes (with skin).   Grains. Whole wheat bread, brown rice, bran flake cereal, plain oatmeal, white rice, shredded wheat, bran muffins.    SEEK IMMEDIATE MEDICAL CARE IF:  You develop increasing pain or severe bloating.   You have an oral temperature above 101F.   You develop vomiting or bowel movements that are bloody or black.   Hemorrhoids Hemorrhoids are dilated (enlarged) veins around the rectum. Sometimes clots will form in the veins. This makes them swollen and painful. These are called thrombosed hemorrhoids. Causes of hemorrhoids include:  Constipation.   Straining to have a bowel movement.   HEAVY LIFTING HOME CARE INSTRUCTIONS  Eat a well balanced diet and drink 6 to 8 glasses of water every day to avoid constipation. You may also use a bulk laxative.   Avoid straining to have bowel movements.   Keep anal area dry and clean.   Do not use a donut shaped pillow or sit on the toilet for long periods. This increases blood pooling and pain.   Move your bowels when your body has the urge; this will require less straining and will decrease pain and pressure.

## 2013-12-14 NOTE — Op Note (Signed)
[facility] [address] [place], [address]   COLONOSCOPY PROCEDURE REPORT  PATIENT: Tamara Thompson, Tamara Thompson  MR#: [id number] BIRTHDATE: 1950/12/21 , 67  yrs. old GENDER: Female ENDOSCOPIST: Barney Drain, MD REFERRED IB:BCWUG Gerarda Fraction, M.D. PROCEDURE DATE:  12/14/2013 PROCEDURE:   Colonoscopy/ENDOCUFF with snare and cold biopsy polypectomy, RESOLUTION CLIPx1 INDICATIONS:Average risk patient for colon cancer. MEDICATIONS: Promethazine (Phenergan) 12.43m IV, Demerol 125 mg IV, and Versed 5 mg IV  DESCRIPTION OF PROCEDURE:    Physical exam was performed.  Informed consent was obtained from the patient after explaining the benefits, risks, and alternatives to procedure.  The patient was connected to monitor and placed in left lateral position. Continuous oxygen was provided by nasal cannula and IV medicine administered through an indwelling cannula.  After administration of sedation and rectal exam, the patients rectum was intubated and the [id number]  colonoscope was advanced under direct visualization to the ileum.  The scope was removed slowly by carefully examining the color, texture, anatomy, and integrity mucosa on the way out.  The patient was recovered in endoscopy and discharged home in satisfactory condition.    COLON FINDINGS: The mucosa appeared normal in the terminal ileum.  , Two sessile polyps measuring 8-10 mm in size were found in the ascending colon(10MM) and sigmoid colon.  A polypectomy was performed using snare cautery. CLIP PLACED IN ASCENDING COLON TO PREVENT POSTPOLYPECTOMY BLEEDING. Eight sessile polyps measuring 3-4 mm in size were found in the descending colon(1), sigmoid colon(3), and rectum(5).  A polypectomy was performed with cold forceps.  , There was moderate diverticulosis noted throughout the entire examined colon with associated tortuosity and muscular hypertrophy.  , and The colon was redundant.  Manual  abdominal counter-pressure was used to reach the cecum.  The patient was moved on to their back to reach the cecum.  PREP QUALITY: good.  CECAL W/D TIME: 27 minutes     COMPLICATIONS: None  ENDOSCOPIC IMPRESSION: 1.   Normal mucosa in the terminal ileum 2.   10 COLON POLYPS REMOVED 3.   Moderate diverticulosis throughout the entire examined colon 4.   The LEFT colon IS redundant   RECOMMENDATIONS: No MRI for 30 days. FOLLOW A HIGH FIBER DIET.  AVOID ITEMS THAT CAUSE BLOATING. BIOPSY RESULTS SHOULD BE BACK IN 7 DAYS.  Next colonoscopy in 1-3 years WITH AN OVERTUBE.     _______________________________ eLorrin MaisBarney Drain MD 12/14/2013 12:49 PM

## 2013-12-14 NOTE — H&P (Signed)
  Primary Care Physician:  Glo Herring., MD Primary Gastroenterologist:  Dr. Oneida Alar  Pre-Procedure History & Physical: HPI:  Tamara Thompson is a 63 y.o. female here for [facility].  Past Medical History  Diagnosis Date  . Bleeding from the nose   . Medical history non-contributory     Past Surgical History  Procedure Laterality Date  . Ovarian cyst surgery    . Ankle surgery      Prior to Admission medications   Medication Sig Start Date End Date Taking? Authorizing Provider  polyethylene glycol-electrolytes (TRILYTE) 420 G solution Take 4,000 mLs by mouth as directed. 11/30/13  Yes Danie Binder, MD    Allergies as of 11/19/2013 - Review Complete 11/19/2013  Allergen Reaction Noted  . Penicillins  02/28/2012    Family History  Problem Relation Age of Onset  . Colon cancer Neg Hx     History   Social History  . Marital Status: Married    Spouse Name: N/A    Number of Children: N/A  . Years of Education: N/A   Occupational History  . Not on file.   Social History Main Topics  . Smoking status: Former Smoker -- 1.25 packs/day for 50 years  . Smokeless tobacco: Not on file  . Alcohol Use: No  . Drug Use: No  . Sexual Activity: Not on file   Other Topics Concern  . Not on file   Social History Narrative  . No narrative on file    Review of Systems: See HPI, otherwise negative ROS   Physical Exam: There were no vitals taken for this visit. General:   Alert,  pleasant and cooperative in NAD Head:  Normocephalic and atraumatic. Neck:  Supple; Lungs:  Clear throughout to auscultation.    Heart:  Regular rate and rhythm. Abdomen:  Soft, nontender and nondistended. Normal bowel sounds, without guarding, and without rebound.   Neurologic:  Alert and  oriented x4;  grossly normal neurologically.  Impression/Plan:     SCREENING  Plan:  1. TCS TODAY

## 2013-12-17 ENCOUNTER — Encounter (HOSPITAL_COMMUNITY): Payer: Self-pay | Admitting: Gastroenterology

## 2013-12-23 ENCOUNTER — Telehealth: Payer: Self-pay | Admitting: Gastroenterology

## 2013-12-23 NOTE — Telephone Encounter (Signed)
Please call pt. She had a SERRATED ADENOMA removed from her colon.    No MRI UNTIL AFTER SEP 24.  FOLLOW A HIGH FIBER DIET. AVOID ITEMS THAT CAUSE BLOATING.   Next colonoscopy in 3 years WITH AN OVERTUBE. YOUR SISTERS, BROTHERS, CHILDREN, AND PARENTS NEED TO HAVE A COLONOSCOPY STARTING AT THE AGE OF 40.

## 2013-12-24 NOTE — Telephone Encounter (Signed)
Reminder in epic °

## 2013-12-24 NOTE — Telephone Encounter (Signed)
Pt is aware of results. 

## 2013-12-29 ENCOUNTER — Encounter: Payer: Self-pay | Admitting: *Deleted

## 2013-12-30 ENCOUNTER — Encounter: Payer: Self-pay | Admitting: Obstetrics & Gynecology

## 2013-12-30 ENCOUNTER — Ambulatory Visit (INDEPENDENT_AMBULATORY_CARE_PROVIDER_SITE_OTHER): Payer: BC Managed Care – PPO | Admitting: Obstetrics & Gynecology

## 2013-12-30 VITALS — BP 120/80 | Ht 65.0 in | Wt 163.0 lb

## 2013-12-30 DIAGNOSIS — Z975 Presence of (intrauterine) contraceptive device: Secondary | ICD-10-CM

## 2013-12-30 DIAGNOSIS — Z30432 Encounter for removal of intrauterine contraceptive device: Secondary | ICD-10-CM

## 2013-12-30 NOTE — Progress Notes (Signed)
Patient ID: Tamara Thompson, female   DOB: [birth year], 63 y.o.   MRN: [id number] Pt presents as a referral for cervical polyp  On exam has an IUD string present Pt thought she had it removed many many years ago She said it has been in 40 years  IUD removed without difficulty  Follow up prn or 1 year

## 2013-12-31 ENCOUNTER — Ambulatory Visit (INDEPENDENT_AMBULATORY_CARE_PROVIDER_SITE_OTHER): Payer: BC Managed Care – PPO | Admitting: Otolaryngology

## 2013-12-31 DIAGNOSIS — J342 Deviated nasal septum: Secondary | ICD-10-CM

## 2013-12-31 DIAGNOSIS — J330 Polyp of nasal cavity: Secondary | ICD-10-CM

## 2013-12-31 DIAGNOSIS — J343 Hypertrophy of nasal turbinates: Secondary | ICD-10-CM

## 2014-01-01 ENCOUNTER — Other Ambulatory Visit (INDEPENDENT_AMBULATORY_CARE_PROVIDER_SITE_OTHER): Payer: Self-pay | Admitting: Otolaryngology

## 2014-01-01 DIAGNOSIS — J320 Chronic maxillary sinusitis: Secondary | ICD-10-CM

## 2014-01-07 ENCOUNTER — Ambulatory Visit (HOSPITAL_COMMUNITY)
Admission: RE | Admit: 2014-01-07 | Discharge: 2014-01-07 | Disposition: A | Discharge status: Home / Self Care | Payer: BC Managed Care – PPO | Source: Ambulatory Visit | Attending: Otolaryngology | Admitting: Otolaryngology

## 2014-01-07 DIAGNOSIS — J320 Chronic maxillary sinusitis: Secondary | ICD-10-CM | POA: Diagnosis present

## 2014-01-14 ENCOUNTER — Ambulatory Visit (INDEPENDENT_AMBULATORY_CARE_PROVIDER_SITE_OTHER): Payer: BC Managed Care – PPO | Admitting: Otolaryngology

## 2014-01-14 DIAGNOSIS — J343 Hypertrophy of nasal turbinates: Secondary | ICD-10-CM

## 2014-01-14 DIAGNOSIS — J330 Polyp of nasal cavity: Secondary | ICD-10-CM

## 2014-01-14 DIAGNOSIS — J342 Deviated nasal septum: Secondary | ICD-10-CM

## 2014-01-19 ENCOUNTER — Other Ambulatory Visit: Payer: Self-pay | Admitting: Otolaryngology

## 2014-01-22 ENCOUNTER — Other Ambulatory Visit: Payer: Self-pay | Admitting: Urology

## 2014-01-22 ENCOUNTER — Ambulatory Visit (INDEPENDENT_AMBULATORY_CARE_PROVIDER_SITE_OTHER): Payer: BC Managed Care – PPO | Admitting: Urology

## 2014-01-22 DIAGNOSIS — N200 Calculus of kidney: Secondary | ICD-10-CM

## 2014-01-22 DIAGNOSIS — N2889 Other specified disorders of kidney and ureter: Secondary | ICD-10-CM

## 2014-01-22 DIAGNOSIS — R350 Frequency of micturition: Secondary | ICD-10-CM

## 2014-01-27 ENCOUNTER — Encounter (HOSPITAL_BASED_OUTPATIENT_CLINIC_OR_DEPARTMENT_OTHER): Payer: Self-pay | Admitting: *Deleted

## 2014-02-02 ENCOUNTER — Encounter (HOSPITAL_BASED_OUTPATIENT_CLINIC_OR_DEPARTMENT_OTHER): Payer: BC Managed Care – PPO | Admitting: Anesthesiology

## 2014-02-02 ENCOUNTER — Encounter (HOSPITAL_BASED_OUTPATIENT_CLINIC_OR_DEPARTMENT_OTHER): Payer: Self-pay | Admitting: *Deleted

## 2014-02-02 ENCOUNTER — Ambulatory Visit (HOSPITAL_BASED_OUTPATIENT_CLINIC_OR_DEPARTMENT_OTHER)
Admission: RE | Admit: 2014-02-02 | Discharge: 2014-02-02 | Disposition: A | Discharge status: Home / Self Care | Payer: BC Managed Care – PPO | Source: Ambulatory Visit | Attending: Otolaryngology | Admitting: Otolaryngology

## 2014-02-02 ENCOUNTER — Encounter (HOSPITAL_BASED_OUTPATIENT_CLINIC_OR_DEPARTMENT_OTHER)
Admission: RE | Disposition: A | Discharge status: Home / Self Care | Payer: Self-pay | Source: Ambulatory Visit | Attending: Otolaryngology

## 2014-02-02 ENCOUNTER — Ambulatory Visit (HOSPITAL_BASED_OUTPATIENT_CLINIC_OR_DEPARTMENT_OTHER): Payer: BC Managed Care – PPO | Admitting: Anesthesiology

## 2014-02-02 DIAGNOSIS — J339 Nasal polyp, unspecified: Secondary | ICD-10-CM | POA: Insufficient documentation

## 2014-02-02 DIAGNOSIS — Z880 Allergy status to penicillin: Secondary | ICD-10-CM | POA: Insufficient documentation

## 2014-02-02 DIAGNOSIS — J342 Deviated nasal septum: Secondary | ICD-10-CM | POA: Diagnosis not present

## 2014-02-02 DIAGNOSIS — J343 Hypertrophy of nasal turbinates: Secondary | ICD-10-CM | POA: Diagnosis not present

## 2014-02-02 DIAGNOSIS — Z9889 Other specified postprocedural states: Secondary | ICD-10-CM

## 2014-02-02 DIAGNOSIS — J3489 Other specified disorders of nose and nasal sinuses: Secondary | ICD-10-CM | POA: Diagnosis not present

## 2014-02-02 HISTORY — DX: Anemia, unspecified: D64.9

## 2014-02-02 HISTORY — PX: NASAL SEPTOPLASTY W/ TURBINOPLASTY: SHX2070

## 2014-02-02 HISTORY — PX: POLYPECTOMY: SHX149

## 2014-02-02 LAB — POCT HEMOGLOBIN-HEMACUE: Hemoglobin: 13.8 g/dL (ref 12.0–15.0)

## 2014-02-02 SURGERY — SEPTOPLASTY, NOSE, WITH NASAL TURBINATE REDUCTION
Anesthesia: General | Laterality: Right

## 2014-02-02 MED ORDER — SUCCINYLCHOLINE CHLORIDE 20 MG/ML IJ SOLN
INTRAMUSCULAR | Status: DC | PRN
  Administered 2014-02-02: 100 mg via INTRAVENOUS

## 2014-02-02 MED ORDER — MUPIROCIN 2 % EX OINT
TOPICAL_OINTMENT | CUTANEOUS | Status: AC
  Filled 2014-02-02: qty 22

## 2014-02-02 MED ORDER — OXYMETAZOLINE HCL 0.05 % NA SOLN
NASAL | Status: DC | PRN
  Administered 2014-02-02: 1 via NASAL

## 2014-02-02 MED ORDER — FENTANYL CITRATE 0.05 MG/ML IJ SOLN: 50.0000 ug | INTRAMUSCULAR | Status: DC | PRN

## 2014-02-02 MED ORDER — MIDAZOLAM HCL 2 MG/2ML IJ SOLN
INTRAMUSCULAR | Status: AC
  Filled 2014-02-02: qty 2

## 2014-02-02 MED ORDER — LIDOCAINE-EPINEPHRINE 1 %-1:100000 IJ SOLN
INTRAMUSCULAR | Status: DC | PRN
  Administered 2014-02-02: 3 mL

## 2014-02-02 MED ORDER — ONDANSETRON HCL 4 MG/2ML IJ SOLN
INTRAMUSCULAR | Status: DC | PRN
  Administered 2014-02-02: 4 mg via INTRAVENOUS

## 2014-02-02 MED ORDER — MIDAZOLAM HCL 2 MG/2ML IJ SOLN: 1.0000 mg | INTRAMUSCULAR | Status: DC | PRN

## 2014-02-02 MED ORDER — OXYCODONE HCL 5 MG PO TABS: 5.0000 mg | ORAL_TABLET | Freq: Once | ORAL | Status: DC | PRN

## 2014-02-02 MED ORDER — ONDANSETRON HCL 4 MG/2ML IJ SOLN
INTRAMUSCULAR | Status: DC | PRN
Start: 2014-02-02 — End: 2014-02-02

## 2014-02-02 MED ORDER — PROPOFOL 10 MG/ML IV BOLUS
INTRAVENOUS | Status: DC | PRN
  Administered 2014-02-02: 200 mg via INTRAVENOUS

## 2014-02-02 MED ORDER — OXYCODONE HCL 5 MG/5ML PO SOLN: 5.0000 mg | Freq: Once | ORAL | Status: DC | PRN

## 2014-02-02 MED ORDER — HYDROMORPHONE HCL 1 MG/ML IJ SOLN
0.2500 mg | INTRAMUSCULAR | Status: DC | PRN
  Administered 2014-02-02: 0.5 mg via INTRAVENOUS
  Administered 2014-02-02 (×2): 0.25 mg via INTRAVENOUS

## 2014-02-02 MED ORDER — LACTATED RINGERS IV SOLN
INTRAVENOUS | Status: DC
  Administered 2014-02-02 (×2): via INTRAVENOUS

## 2014-02-02 MED ORDER — HYDROCODONE-ACETAMINOPHEN 5-325 MG PO TABS: 1.0000 | ORAL_TABLET | ORAL | Status: DC | PRN

## 2014-02-02 MED ORDER — FENTANYL CITRATE 0.05 MG/ML IJ SOLN
INTRAMUSCULAR | Status: DC | PRN
  Administered 2014-02-02 (×2): 50 ug via INTRAVENOUS
  Administered 2014-02-02: 100 ug via INTRAVENOUS

## 2014-02-02 MED ORDER — EPHEDRINE SULFATE 50 MG/ML IJ SOLN
INTRAMUSCULAR | Status: DC | PRN
  Administered 2014-02-02 (×3): 10 mg via INTRAVENOUS

## 2014-02-02 MED ORDER — MUPIROCIN 2 % EX OINT
TOPICAL_OINTMENT | CUTANEOUS | Status: DC | PRN
  Administered 2014-02-02: 1 via NASAL

## 2014-02-02 MED ORDER — DEXAMETHASONE SODIUM PHOSPHATE 4 MG/ML IJ SOLN
INTRAMUSCULAR | Status: DC | PRN
  Administered 2014-02-02: 10 mg via INTRAVENOUS

## 2014-02-02 MED ORDER — LIDOCAINE HCL (CARDIAC) 10 MG/ML IV SOLN
INTRAVENOUS | Status: DC | PRN
  Administered 2014-02-02: 60 mg via INTRAVENOUS

## 2014-02-02 MED ORDER — CLINDAMYCIN HCL 300 MG PO CAPS: 300.0000 mg | ORAL_CAPSULE | Freq: Three times a day (TID) | ORAL | Status: DC

## 2014-02-02 MED ORDER — FENTANYL CITRATE 0.05 MG/ML IJ SOLN
INTRAMUSCULAR | Status: AC
  Filled 2014-02-02: qty 6

## 2014-02-02 MED ORDER — MIDAZOLAM HCL 5 MG/5ML IJ SOLN
INTRAMUSCULAR | Status: DC | PRN
  Administered 2014-02-02: 2 mg via INTRAVENOUS

## 2014-02-02 MED ORDER — OXYMETAZOLINE HCL 0.05 % NA SOLN
NASAL | Status: AC
  Filled 2014-02-02: qty 15

## 2014-02-02 MED ORDER — ONDANSETRON HCL 4 MG/2ML IJ SOLN: 4.0000 mg | Freq: Four times a day (QID) | INTRAMUSCULAR | Status: DC | PRN

## 2014-02-02 MED ORDER — HYDROMORPHONE HCL 1 MG/ML IJ SOLN
INTRAMUSCULAR | Status: AC
  Filled 2014-02-02: qty 1

## 2014-02-02 MED ORDER — LIDOCAINE-EPINEPHRINE 1 %-1:100000 IJ SOLN
INTRAMUSCULAR | Status: AC
  Filled 2014-02-02: qty 1

## 2014-02-02 SURGICAL SUPPLY — 44 items
ATTRACTOMAT 16X20 MAGNETIC DRP (DRAPES) IMPLANT
BLADE SURG 15 STRL LF DISP TIS (BLADE) IMPLANT
BLADE SURG 15 STRL SS (BLADE)
CANISTER SUCT 1200ML W/VALVE (MISCELLANEOUS) ×4 IMPLANT
CATH SINUS GUIDE F-70 (CATHETERS) IMPLANT
COAGULATOR SUCT 6 FR SWTCH (ELECTROSURGICAL)
COAGULATOR SUCT 8FR VV (MISCELLANEOUS) ×4 IMPLANT
COAGULATOR SUCT SWTCH 10FR 6 (ELECTROSURGICAL) IMPLANT
DECANTER SPIKE VIAL GLASS SM (MISCELLANEOUS) IMPLANT
DRSG NASOPORE 8CM (GAUZE/BANDAGES/DRESSINGS) IMPLANT
DRSG TELFA 3X8 NADH (GAUZE/BANDAGES/DRESSINGS) IMPLANT
ELECT REM PT RETURN 9FT ADLT (ELECTROSURGICAL) ×4
ELECTRODE REM PT RTRN 9FT ADLT (ELECTROSURGICAL) ×2 IMPLANT
GLOVE BIO SURGEON STRL SZ7.5 (GLOVE) ×4 IMPLANT
GLOVE BIOGEL PI IND STRL 7.0 (GLOVE) IMPLANT
GLOVE BIOGEL PI INDICATOR 7.0 (GLOVE) ×2
GLOVE ECLIPSE 6.5 STRL STRAW (GLOVE) ×2 IMPLANT
GOWN STRL REUS W/ TWL LRG LVL3 (GOWN DISPOSABLE) ×4 IMPLANT
GOWN STRL REUS W/TWL LRG LVL3 (GOWN DISPOSABLE) ×8
MARKER SKIN DUAL TIP RULER LAB (MISCELLANEOUS) IMPLANT
NDL HYPO 25X1 1.5 SAFETY (NEEDLE) ×2 IMPLANT
NEEDLE HYPO 25X1 1.5 SAFETY (NEEDLE) ×4 IMPLANT
NS IRRIG 1000ML POUR BTL (IV SOLUTION) ×4 IMPLANT
PACK BASIN DAY SURGERY FS (CUSTOM PROCEDURE TRAY) ×4 IMPLANT
PACK ENT DAY SURGERY (CUSTOM PROCEDURE TRAY) ×4 IMPLANT
PAD DRESSING TELFA 3X8 NADH (GAUZE/BANDAGES/DRESSINGS) IMPLANT
SCRUB TECHNI CARE SURGICAL (MISCELLANEOUS) IMPLANT
SET EXT MALE ROTATING LL 32IN (MISCELLANEOUS) IMPLANT
SET IV EXT TUBING FEMALE 31 (MISCELLANEOUS) IMPLANT
SLEEVE SCD COMPRESS KNEE MED (MISCELLANEOUS) ×2 IMPLANT
SOLUTION BUTLER CLEAR DIP (MISCELLANEOUS) ×6 IMPLANT
SPLINT NASAL AIRWAY SILICONE (MISCELLANEOUS) ×2 IMPLANT
SPONGE GAUZE 2X2 8PLY STER LF (GAUZE/BANDAGES/DRESSINGS) ×1
SPONGE GAUZE 2X2 8PLY STRL LF (GAUZE/BANDAGES/DRESSINGS) ×3 IMPLANT
SPONGE NEURO XRAY DETECT 1X3 (DISPOSABLE) ×4 IMPLANT
SUT CHROMIC 4 0 P 3 18 (SUTURE) ×4 IMPLANT
SUT PLAIN 4 0 ~~LOC~~ 1 (SUTURE) ×4 IMPLANT
SUT PROLENE 3 0 PS 2 (SUTURE) ×4 IMPLANT
SUT VIC AB 4-0 P-3 18XBRD (SUTURE) IMPLANT
SUT VIC AB 4-0 P3 18 (SUTURE)
TOWEL OR 17X24 6PK STRL BLUE (TOWEL DISPOSABLE) ×4 IMPLANT
TUBE SALEM SUMP 12R W/ARV (TUBING) IMPLANT
TUBE SALEM SUMP 16 FR W/ARV (TUBING) ×4 IMPLANT
YANKAUER SUCT BULB TIP NO VENT (SUCTIONS) ×4 IMPLANT

## 2014-02-02 NOTE — Anesthesia Postprocedure Evaluation (Signed)
Anesthesia Post Note  Patient: Tamara Thompson  Procedure(s) Performed: Procedure(s) (LRB): RIGHT NASAL SEPTOPLASTY WITH TURBINATE REDUCTION (Bilateral) RIGHT POLYPECTOMY NASAL (Right)  Anesthesia type: General  Patient location: PACU  Post pain: Pain level controlled and Adequate analgesia  Post assessment: Post-op Vital signs reviewed, Patient's Cardiovascular Status Stable, Respiratory Function Stable, Patent Airway and Pain level controlled  Last Vitals:  Filed Vitals:   02/02/14 1145  BP: 131/66  Pulse: 118  Temp:   Resp: 18    Post vital signs: Reviewed and stable  Level of consciousness: awake, alert  and oriented  Complications: No apparent anesthesia complications

## 2014-02-02 NOTE — Op Note (Signed)
DATE OF PROCEDURE: 02/02/2014  OPERATIVE REPORT   SURGEON: Leta Baptist, MD   PREOPERATIVE DIAGNOSES:  1. Severe nasal septal deviation.  2. Bilateral inferior turbinate hypertrophy.  3. Chronic nasal obstruction. 4. Right nasal polyp.  POSTOPERATIVE DIAGNOSES:  1. Severe nasal septal deviation.  2. Bilateral inferior turbinate hypertrophy.  3. Chronic nasal obstruction. 4. Right nasal polyp.  PROCEDURE PERFORMED:  1. Septoplasty.  2. Bilateral partial inferior turbinate resection.  3. Right nasal polypectomy.  ANESTHESIA: General endotracheal tube anesthesia.   COMPLICATIONS: None.   ESTIMATED BLOOD LOSS: Less than 100 mL.   INDICATION FOR PROCEDURE: Tamara Thompson is a 63 y.o. female with a history of chronic nasal obstruction. The patient was  treated with antihistamine, decongestant, steroid nasal spray, and systemic steroids. However, the patient continues to be symptomatic. On examination, the patient was noted to have bilateral severe inferior turbinate hypertrophy and significant nasal septal deviation, causing significant nasal obstruction. A right nasal polyp was also noted. Based on the above findings, the decision was made for the patient to undergo the above-stated procedure. The risks, benefits, alternatives, and details of the procedure were discussed with the patient. Questions were invited and answered. Informed consent was obtained.   DESCRIPTION OF PROCEDURE: The patient was taken to the operating room and placed supine on the operating table. General endotracheal tube anesthesia was administered by the anesthesiologist. The patient was positioned, and prepped and draped in the standard fashion for nasal surgery. Pledgets soaked with Afrin were placed in both nasal cavities for decongestion. The pledgets were subsequently removed. The above mentioned severe septal deviation was again noted. 1% lidocaine with 1:100,000 epinephrine was injected onto the nasal septum  bilaterally. A hemitransfixion incision was made on the left side. The mucosal flap was carefully elevated on the left side. A cartilaginous incision was made 1 cm superior to the caudal margin of the nasal septum. Mucosal flap was also elevated on the right side in the similar fashion. It should be noted that due to the severe septal deviation, the deviated portion of the cartilaginous and bony septum had to be removed in piecemeal fashion. Once the deviated portions were removed, a straight midline septum was achieved. The septum was then quilted with 4-0 plain gut sutures.  A right nasal polyp was noted The polyp was noted to obstruct the middle meatus. Under the guidance of a rigid endoscope, the polyp was removed with a Blakesley forceps. The hemitransfixion incision was closed with interrupted 4-0 chromic sutures. Doyle splints were applied.   Prior to the [facility] splint application, the inferior one half of both hypertrophied inferior turbinate was crossclamped with a Kelly clamp. The inferior one half of each inferior turbinate was then resected with a pair of cross cutting scissors. Hemostasis was achieved with a suction cautery device.   The care of the patient was turned over to the anesthesiologist. The patient was awakened from anesthesia without difficulty. The patient was extubated and transferred to the recovery room in good condition.   OPERATIVE FINDINGS: Severe nasal septal deviation and bilateral inferior turbinate hypertrophy. Right nasal polyp.  SPECIMEN: Right nasal polyp.  FOLLOWUP CARE: The patient be discharged home once she is awake and alert. The patient will be placed on vicodin1-2 tablets p.o. q.6 hours p.r.n. pain, and clindamycin 360m po TID for 5 days. The patient will follow up in my office in approximately 1 week for splint removal.   Tamara Thompson WRaynelle Bring MD

## 2014-02-02 NOTE — H&P (Signed)
Cc: Chronic nasal obstruction, nasal polyposis  HPI: The patient is a 63 year old female who returns today for her follow-up evaluation.  The patient was last seen 2 weeks ago. At that time, she was noted to have right nasal polyp with left septal deviation and turbinate hypertrophy.  The patient also has a history of frequent recurrent epistaxis.  The patient was treated with nasal saline irrigation and nasal gel. She subsequently underwent paranasal sinus CT scan. The CT shows polypoid tissue within the right middle meatus.  The polyp does not involve the paranasal sinuses.  The patient returns today complaining of persistent nasal obstruction.  She has become a habitual mouth breather.  She continues to have frequent minor epistaxis.  She denies any facial pain, fever or visual change.   Exam: The nasal cavities were decongested and anesthetised with a combination of oxymetazoline and 4% lidocaine solution.  The flexible scope was inserted into the right nasal cavity.  Endoscopy of the inferior and middle meatus was performed.  Edematous mucosa was noted.  Polyps are noted to obstruct the right middle meatus.  Olfactory cleft was clear.  Nasopharynx was clear.  Turbinates were hypertrophied but without mass.  Incomplete response to decongestion.  NSD noted. The procedure was repeated on the contralateral side with similar findings.  The patient tolerated the procedure well.  Instructions were given to avoid eating or drinking for 2 hours.    Assessment: 1.  Right nasal polyps are noted to obstruct the right middle meatus.   2.  No sinus involvement is noted on the CT scan.   3.  Nasal septal deviation and bilateral inferior turbinate are obstructing more than 90% of her nasal passageway bilaterally.   Plan: 1.  The nasal endoscopy findings and the CT images are reviewed with the patient.  2.  In light of the above findings, the patient will likely benefit from undergoing endoscopic polypectomy,  septoplasty, and bilateral turbinate reduction to improve her nasal passageway.   3.  The risks, benefits, alternatives and details of the procedures are reviewed with the patient.   4.  Questions are invited and answered.  5.  We will schedule the procedures in accordance with the patient's schedule.

## 2014-02-02 NOTE — Anesthesia Procedure Notes (Signed)
Procedure Name: Intubation Date/Time: 02/02/2014 10:27 AM Performed by: Lyndee Leo Pre-anesthesia Checklist: Patient identified, Emergency Drugs available, Suction available and Patient being monitored Patient Re-evaluated:Patient Re-evaluated prior to inductionOxygen Delivery Method: Circle System Utilized Preoxygenation: Pre-oxygenation with 100% oxygen Intubation Type: IV induction Ventilation: Mask ventilation without difficulty Laryngoscope Size: Mac and 3 Grade View: Grade I Tube type: Oral Rae Tube size: 7.0 mm Number of attempts: 1 Airway Equipment and Method: stylet and oral airway Placement Confirmation: ETT inserted through vocal cords under direct vision,  positive ETCO2 and breath sounds checked- equal and bilateral Secured at: 20 cm Tube secured with: Tape Dental Injury: Teeth and Oropharynx as per pre-operative assessment

## 2014-02-02 NOTE — Anesthesia Preprocedure Evaluation (Signed)
Anesthesia Evaluation  Patient identified by MRN, date of birth, ID band Patient awake    Reviewed: Allergy & Precautions, H&P , NPO status , Patient's Chart, lab work & pertinent test results  Airway Mallampati: II  Neck ROM: full    Dental   Pulmonary former smoker,          Cardiovascular negative cardio ROS      Neuro/Psych    GI/Hepatic   Endo/Other    Renal/GU      Musculoskeletal   Abdominal   Peds  Hematology   Anesthesia Other Findings   Reproductive/Obstetrics                           Anesthesia Physical Anesthesia Plan  ASA: II  Anesthesia Plan: General   Post-op Pain Management:    Induction: Intravenous  Airway Management Planned: Oral ETT  Additional Equipment:   Intra-op Plan:   Post-operative Plan: Extubation in OR  Informed Consent: I have reviewed the patients History and Physical, chart, labs and discussed the procedure including the risks, benefits and alternatives for the proposed anesthesia with the patient or authorized representative who has indicated his/her understanding and acceptance.     Plan Discussed with: CRNA, Anesthesiologist and Surgeon  Anesthesia Plan Comments:         Anesthesia Quick Evaluation

## 2014-02-02 NOTE — Discharge Instructions (Addendum)
POSTOPERATIVE INSTRUCTIONS FOR PATIENTS HAVING NASAL OR SINUS OPERATIONS ACTIVITY: Restrict activity at home for the first two days, resting as much as possible. Light activity is best. You may usually return to work within a week. You should refrain from nose blowing, strenuous activity, or heavy lifting greater than 20lbs for a total of three weeks after your operation.  If sneezing cannot be avoided, sneeze with your mouth open. DISCOMFORT: You may experience a dull headache and pressure along with nasal congestion and discharge. These symptoms may be worse during the first week after the operation but may last as long as two to four weeks.  Please take Tylenol or the pain medication that has been prescribed for you. Do not take aspirin or aspirin containing medications since they may cause bleeding.  You may experience symptoms of post nasal drainage, nasal congestion, headaches and fatigue for two or three months after your operation.  BLEEDING: You may have some blood tinged nasal drainage for approximately two weeks after the operation.  The discharge will be worse for the first week.  Please call our office at [phone] or go to the nearest hospital emergency room if you experience any of the following: heavy, bright red blood from your nose or mouth that lasts longer than ten minutes or coughing up or vomiting bright red blood or blood clots. GENERAL CONSIDERATIONS: 1. A gauze dressing will be placed on your upper lip to absorb any drainage after the operation. You may need to change this several times a day.  If you do not have very much drainage, you may remove the dressing.  Remember that you may gently wipe your nose with a tissue and sniff in, but DO NOT blow your nose. 2. Please keep all of your postoperative appointments.  Your final results after the operation will depend on proper follow-up.  The initial visit is usually four to seven days after the operation.  During this visit, the  remaining nasal packing and internal septal splints will be removed.  Your nasal and sinus cavities will be cleaned.  During the second visit, your nasal and sinus cavities will be cleaned again. Have someone drive you to your first two postoperative appointments. We suggest that you take your prescribed pain medication about  hour prior to each of these two appointments.  3. How you care for your nose after the operation will influence the results that you obtain.  You should follow all directions, take your medication as prescribed, and call our office [phone] with any problems or questions. 4. You may be more comfortable sleeping with your head elevated on two pillows. 5. Do not take any medications that we have not prescribed or recommended. WARNING SIGNS: if any of the following should occur, please call our office: 1. Bright red bleeding which lasts more than 10 minutes. 2. Persistent fever greater than 102F. 3. Persistent vomiting. 4. Severe and constant pain that is not relieved by prescribed pain medication. 5. Trauma to the nose. 6. Rash or unusual side effects from any medicines.    Post Anesthesia Home Care Instructions  Activity: Get plenty of rest for the remainder of the day. A responsible adult should stay with you for 24 hours following the procedure.  For the next 24 hours, DO NOT: -Drive a car -Paediatric nurse -Drink alcoholic beverages -Take any medication unless instructed by your physician -Make any legal decisions or sign important papers.  Meals: Start with liquid foods such as gelatin or soup. Progress  to regular foods as tolerated. Avoid greasy, spicy, heavy foods. If nausea and/or vomiting occur, drink only clear liquids until the nausea and/or vomiting subsides. Call your physician if vomiting continues.  Special Instructions/Symptoms: Your throat may feel dry or sore from the anesthesia or the breathing tube placed in your throat during surgery. If  this causes discomfort, gargle with warm salt water. The discomfort should disappear within 24 hours.

## 2014-02-02 NOTE — Transfer of Care (Signed)
Immediate Anesthesia Transfer of Care Note  Patient: Tamara Thompson  Procedure(s) Performed: Procedure(s): RIGHT NASAL SEPTOPLASTY WITH TURBINATE REDUCTION (Bilateral) RIGHT POLYPECTOMY NASAL (Right)  Patient Location: PACU  Anesthesia Type:General  Level of Consciousness: awake, sedated and patient cooperative  Airway & Oxygen Therapy: Patient Spontanous Breathing and aerosol face mask  Post-op Assessment: Report given to PACU RN and Post -op Vital signs reviewed and stable  Post vital signs: Reviewed and stable  Complications: No apparent anesthesia complications

## 2014-02-03 ENCOUNTER — Encounter (HOSPITAL_BASED_OUTPATIENT_CLINIC_OR_DEPARTMENT_OTHER): Payer: Self-pay | Admitting: Otolaryngology

## 2014-02-15 ENCOUNTER — Ambulatory Visit (HOSPITAL_COMMUNITY)
Admission: RE | Admit: 2014-02-15 | Discharge: 2014-02-15 | Disposition: A | Discharge status: Home / Self Care | Payer: BC Managed Care – PPO | Source: Ambulatory Visit | Attending: Urology | Admitting: Urology

## 2014-02-15 DIAGNOSIS — K760 Fatty (change of) liver, not elsewhere classified: Secondary | ICD-10-CM | POA: Insufficient documentation

## 2014-02-15 DIAGNOSIS — N200 Calculus of kidney: Secondary | ICD-10-CM | POA: Insufficient documentation

## 2014-02-15 LAB — POCT I-STAT CREATININE: Creatinine, Ser: 0.8 mg/dL (ref 0.50–1.10)

## 2014-02-15 MED ORDER — SODIUM CHLORIDE 0.9 % IV SOLN
INTRAVENOUS | Status: AC
  Filled 2014-02-15: qty 250

## 2014-02-15 MED ORDER — IOHEXOL 300 MG/ML  SOLN
125.0000 mL | Freq: Once | INTRAMUSCULAR | Status: AC | PRN
  Administered 2014-02-15: 125 mL via INTRAVENOUS

## 2014-02-18 ENCOUNTER — Ambulatory Visit (INDEPENDENT_AMBULATORY_CARE_PROVIDER_SITE_OTHER): Payer: BC Managed Care – PPO | Admitting: Otolaryngology

## 2014-02-19 ENCOUNTER — Ambulatory Visit (INDEPENDENT_AMBULATORY_CARE_PROVIDER_SITE_OTHER): Payer: BC Managed Care – PPO | Admitting: Urology

## 2014-02-19 DIAGNOSIS — N200 Calculus of kidney: Secondary | ICD-10-CM

## 2014-02-19 DIAGNOSIS — N281 Cyst of kidney, acquired: Secondary | ICD-10-CM

## 2014-09-14 ENCOUNTER — Other Ambulatory Visit (HOSPITAL_COMMUNITY): Payer: Self-pay | Admitting: Internal Medicine

## 2014-09-14 DIAGNOSIS — Z09 Encounter for follow-up examination after completed treatment for conditions other than malignant neoplasm: Secondary | ICD-10-CM

## 2014-10-05 ENCOUNTER — Encounter (HOSPITAL_COMMUNITY): Payer: Self-pay

## 2014-10-22 DIAGNOSIS — R040 Epistaxis: Secondary | ICD-10-CM | POA: Diagnosis not present

## 2014-11-12 DIAGNOSIS — R040 Epistaxis: Secondary | ICD-10-CM | POA: Diagnosis not present

## 2014-11-23 ENCOUNTER — Other Ambulatory Visit (HOSPITAL_COMMUNITY): Payer: Self-pay | Admitting: Internal Medicine

## 2014-11-23 ENCOUNTER — Ambulatory Visit (HOSPITAL_COMMUNITY)
Admission: RE | Admit: 2014-11-23 | Discharge: 2014-11-23 | Disposition: A | Discharge status: Home / Self Care | Payer: Medicare Other | Source: Ambulatory Visit | Attending: Internal Medicine | Admitting: Internal Medicine

## 2014-11-23 DIAGNOSIS — Z09 Encounter for follow-up examination after completed treatment for conditions other than malignant neoplasm: Secondary | ICD-10-CM

## 2014-11-23 DIAGNOSIS — R928 Other abnormal and inconclusive findings on diagnostic imaging of breast: Secondary | ICD-10-CM | POA: Diagnosis not present

## 2014-11-23 DIAGNOSIS — N6320 Unspecified lump in the left breast, unspecified quadrant: Secondary | ICD-10-CM

## 2014-11-23 DIAGNOSIS — N63 Unspecified lump in breast: Secondary | ICD-10-CM | POA: Diagnosis not present

## 2014-11-23 DIAGNOSIS — N6310 Unspecified lump in the right breast, unspecified quadrant: Secondary | ICD-10-CM

## 2014-12-08 DIAGNOSIS — R040 Epistaxis: Secondary | ICD-10-CM | POA: Diagnosis not present

## 2015-01-06 ENCOUNTER — Ambulatory Visit (INDEPENDENT_AMBULATORY_CARE_PROVIDER_SITE_OTHER): Payer: Medicare Other | Admitting: Otolaryngology

## 2015-01-06 DIAGNOSIS — R040 Epistaxis: Secondary | ICD-10-CM | POA: Diagnosis not present

## 2015-01-13 ENCOUNTER — Ambulatory Visit (INDEPENDENT_AMBULATORY_CARE_PROVIDER_SITE_OTHER): Payer: Medicare Other | Admitting: Otolaryngology

## 2015-01-13 DIAGNOSIS — R040 Epistaxis: Secondary | ICD-10-CM

## 2015-01-20 ENCOUNTER — Ambulatory Visit (INDEPENDENT_AMBULATORY_CARE_PROVIDER_SITE_OTHER): Payer: Medicare Other | Admitting: Otolaryngology

## 2015-01-20 DIAGNOSIS — R040 Epistaxis: Secondary | ICD-10-CM

## 2015-01-21 ENCOUNTER — Other Ambulatory Visit: Payer: Self-pay | Admitting: Otolaryngology

## 2015-01-26 ENCOUNTER — Other Ambulatory Visit: Payer: Self-pay | Admitting: Urology

## 2015-01-26 DIAGNOSIS — N281 Cyst of kidney, acquired: Secondary | ICD-10-CM

## 2015-01-28 ENCOUNTER — Encounter (HOSPITAL_BASED_OUTPATIENT_CLINIC_OR_DEPARTMENT_OTHER): Payer: Self-pay | Admitting: *Deleted

## 2015-02-07 ENCOUNTER — Ambulatory Visit (HOSPITAL_BASED_OUTPATIENT_CLINIC_OR_DEPARTMENT_OTHER): Payer: Medicare Other | Admitting: Anesthesiology

## 2015-02-07 ENCOUNTER — Ambulatory Visit (HOSPITAL_BASED_OUTPATIENT_CLINIC_OR_DEPARTMENT_OTHER)
Admission: RE | Admit: 2015-02-07 | Discharge: 2015-02-07 | Disposition: A | Discharge status: Home / Self Care | Payer: Medicare Other | Source: Ambulatory Visit | Attending: Otolaryngology | Admitting: Otolaryngology

## 2015-02-07 ENCOUNTER — Encounter (HOSPITAL_BASED_OUTPATIENT_CLINIC_OR_DEPARTMENT_OTHER)
Admission: RE | Disposition: A | Discharge status: Home / Self Care | Payer: Self-pay | Source: Ambulatory Visit | Attending: Otolaryngology

## 2015-02-07 ENCOUNTER — Encounter (HOSPITAL_BASED_OUTPATIENT_CLINIC_OR_DEPARTMENT_OTHER): Payer: Self-pay | Admitting: *Deleted

## 2015-02-07 DIAGNOSIS — Z880 Allergy status to penicillin: Secondary | ICD-10-CM | POA: Diagnosis not present

## 2015-02-07 DIAGNOSIS — R040 Epistaxis: Secondary | ICD-10-CM | POA: Diagnosis not present

## 2015-02-07 DIAGNOSIS — Z885 Allergy status to narcotic agent status: Secondary | ICD-10-CM | POA: Diagnosis not present

## 2015-02-07 DIAGNOSIS — Z87891 Personal history of nicotine dependence: Secondary | ICD-10-CM | POA: Insufficient documentation

## 2015-02-07 DIAGNOSIS — J338 Other polyp of sinus: Secondary | ICD-10-CM | POA: Diagnosis not present

## 2015-02-07 DIAGNOSIS — D649 Anemia, unspecified: Secondary | ICD-10-CM | POA: Diagnosis not present

## 2015-02-07 HISTORY — PX: NASAL ENDOSCOPY WITH EPISTAXIS CONTROL: SHX5664

## 2015-02-07 HISTORY — DX: Calculus of kidney: N20.0

## 2015-02-07 SURGERY — CONTROL OF EPISTAXIS, ENDOSCOPIC
Anesthesia: General | Site: Nose | Laterality: Bilateral

## 2015-02-07 MED ORDER — BACITRACIN ZINC 500 UNIT/GM EX OINT
TOPICAL_OINTMENT | CUTANEOUS | Status: DC | PRN
  Administered 2015-02-07: 1 via TOPICAL

## 2015-02-07 MED ORDER — MIDAZOLAM HCL 2 MG/2ML IJ SOLN
1.0000 mg | INTRAMUSCULAR | Status: DC | PRN
  Administered 2015-02-07: 2 mg via INTRAVENOUS

## 2015-02-07 MED ORDER — FENTANYL CITRATE (PF) 100 MCG/2ML IJ SOLN
INTRAMUSCULAR | Status: AC
  Filled 2015-02-07: qty 4

## 2015-02-07 MED ORDER — LACTATED RINGERS IV SOLN
INTRAVENOUS | Status: DC
Start: 2015-02-07 — End: 2015-02-07
  Administered 2015-02-07: 09:00:00 via INTRAVENOUS

## 2015-02-07 MED ORDER — ONDANSETRON HCL 4 MG/2ML IJ SOLN
INTRAMUSCULAR | Status: AC
  Filled 2015-02-07: qty 2

## 2015-02-07 MED ORDER — SCOPOLAMINE 1 MG/3DAYS TD PT72: 1.0000 | MEDICATED_PATCH | Freq: Once | TRANSDERMAL | Status: DC | PRN

## 2015-02-07 MED ORDER — PROPOFOL 10 MG/ML IV BOLUS
INTRAVENOUS | Status: AC
  Filled 2015-02-07: qty 20

## 2015-02-07 MED ORDER — LIDOCAINE HCL (CARDIAC) 20 MG/ML IV SOLN
INTRAVENOUS | Status: AC
  Filled 2015-02-07: qty 5

## 2015-02-07 MED ORDER — BACITRACIN ZINC 500 UNIT/GM EX OINT
TOPICAL_OINTMENT | CUTANEOUS | Status: AC
  Filled 2015-02-07: qty 28.35

## 2015-02-07 MED ORDER — PROPOFOL 10 MG/ML IV BOLUS
INTRAVENOUS | Status: DC | PRN
  Administered 2015-02-07: 200 mg via INTRAVENOUS

## 2015-02-07 MED ORDER — PHENYLEPHRINE 40 MCG/ML (10ML) SYRINGE FOR IV PUSH (FOR BLOOD PRESSURE SUPPORT)
PREFILLED_SYRINGE | INTRAVENOUS | Status: AC
  Filled 2015-02-07: qty 10

## 2015-02-07 MED ORDER — SILVER NITRATE-POT NITRATE 75-25 % EX MISC
CUTANEOUS | Status: AC
  Filled 2015-02-07: qty 1

## 2015-02-07 MED ORDER — PHENYLEPHRINE HCL 10 MG/ML IJ SOLN
INTRAMUSCULAR | Status: DC | PRN
  Administered 2015-02-07 (×2): 40 ug via INTRAVENOUS

## 2015-02-07 MED ORDER — BUPIVACAINE HCL (PF) 0.25 % IJ SOLN
INTRAMUSCULAR | Status: AC
  Filled 2015-02-07: qty 30

## 2015-02-07 MED ORDER — MIDAZOLAM HCL 2 MG/2ML IJ SOLN
INTRAMUSCULAR | Status: AC
  Filled 2015-02-07: qty 4

## 2015-02-07 MED ORDER — LIDOCAINE HCL (CARDIAC) 20 MG/ML IV SOLN
INTRAVENOUS | Status: DC | PRN
  Administered 2015-02-07: 50 mg via INTRAVENOUS

## 2015-02-07 MED ORDER — DEXAMETHASONE SODIUM PHOSPHATE 4 MG/ML IJ SOLN
INTRAMUSCULAR | Status: DC | PRN
  Administered 2015-02-07: 10 mg via INTRAVENOUS

## 2015-02-07 MED ORDER — DEXAMETHASONE SODIUM PHOSPHATE 10 MG/ML IJ SOLN
INTRAMUSCULAR | Status: AC
  Filled 2015-02-07: qty 1

## 2015-02-07 MED ORDER — FENTANYL CITRATE (PF) 100 MCG/2ML IJ SOLN
50.0000 ug | INTRAMUSCULAR | Status: DC | PRN
  Administered 2015-02-07: 50 ug via INTRAVENOUS

## 2015-02-07 MED ORDER — GLYCOPYRROLATE 0.2 MG/ML IJ SOLN: 0.2000 mg | Freq: Once | INTRAMUSCULAR | Status: DC | PRN

## 2015-02-07 MED ORDER — OXYMETAZOLINE HCL 0.05 % NA SOLN
NASAL | Status: DC | PRN
  Administered 2015-02-07: 1

## 2015-02-07 MED ORDER — OXYMETAZOLINE HCL 0.05 % NA SOLN
NASAL | Status: AC
  Filled 2015-02-07: qty 15

## 2015-02-07 SURGICAL SUPPLY — 23 items
APPLICATOR COTTON TIP 6IN STRL (MISCELLANEOUS) ×6 IMPLANT
CANISTER SUCT 1200ML W/VALVE (MISCELLANEOUS) ×3 IMPLANT
COAGULATOR SUCT 8FR VV (MISCELLANEOUS) ×3 IMPLANT
CONT SPEC 4OZ CLIKSEAL STRL BL (MISCELLANEOUS) ×6 IMPLANT
DECANTER SPIKE VIAL GLASS SM (MISCELLANEOUS) IMPLANT
DEPRESSOR TONGUE BLADE STERILE (MISCELLANEOUS) IMPLANT
DRSG TELFA 3X8 NADH (GAUZE/BANDAGES/DRESSINGS) IMPLANT
ELECT REM PT RETURN 9FT ADLT (ELECTROSURGICAL) ×3
ELECT REM PT RETURN 9FT PED (ELECTROSURGICAL)
ELECTRODE REM PT RETRN 9FT PED (ELECTROSURGICAL) IMPLANT
ELECTRODE REM PT RTRN 9FT ADLT (ELECTROSURGICAL) IMPLANT
GAUZE SPONGE 4X4 12PLY STRL (GAUZE/BANDAGES/DRESSINGS) ×2 IMPLANT
GLOVE BIO SURGEON STRL SZ7.5 (GLOVE) ×3 IMPLANT
MARKER SKIN DUAL TIP RULER LAB (MISCELLANEOUS) IMPLANT
PAD DRESSING TELFA 3X8 NADH (GAUZE/BANDAGES/DRESSINGS) IMPLANT
SHEET MEDIUM DRAPE 40X70 STRL (DRAPES) ×3 IMPLANT
SOLUTION BUTLER CLEAR DIP (MISCELLANEOUS) ×3 IMPLANT
SPONGE GAUZE 2X2 8PLY STER LF (GAUZE/BANDAGES/DRESSINGS) ×1
SPONGE GAUZE 2X2 8PLY STRL LF (GAUZE/BANDAGES/DRESSINGS) ×1 IMPLANT
SPONGE NEURO XRAY DETECT 1X3 (DISPOSABLE) ×3 IMPLANT
TOWEL OR 17X24 6PK STRL BLUE (TOWEL DISPOSABLE) ×3 IMPLANT
TUBE CONNECTING 20'X1/4 (TUBING) ×1
TUBE CONNECTING 20X1/4 (TUBING) ×2 IMPLANT

## 2015-02-07 NOTE — Discharge Instructions (Addendum)
The patient may resume all her previous activities and diet. She will follow-up in my [place] office as scheduled.  Post Anesthesia Home Care Instructions  Activity: Get plenty of rest for the remainder of the day. A responsible adult should stay with you for 24 hours following the procedure.  For the next 24 hours, DO NOT: -Drive a car -Paediatric nurse -Drink alcoholic beverages -Take any medication unless instructed by your physician -Make any legal decisions or sign important papers.  Meals: Start with liquid foods such as gelatin or soup. Progress to regular foods as tolerated. Avoid greasy, spicy, heavy foods. If nausea and/or vomiting occur, drink only clear liquids until the nausea and/or vomiting subsides. Call your physician if vomiting continues.  Special Instructions/Symptoms: Your throat may feel dry or sore from the anesthesia or the breathing tube placed in your throat during surgery. If this causes discomfort, gargle with warm salt water. The discomfort should disappear within 24 hours.  If you had a scopolamine patch placed behind your ear for the management of post- operative nausea and/or vomiting:  1. The medication in the patch is effective for 72 hours, after which it should be removed.  Wrap patch in a tissue and discard in the trash. Wash hands thoroughly with soap and water. 2. You may remove the patch earlier than 72 hours if you experience unpleasant side effects which may include dry mouth, dizziness or visual disturbances. 3. Avoid touching the patch. Wash your hands with soap and water after contact with the patch.

## 2015-02-07 NOTE — Transfer of Care (Signed)
Immediate Anesthesia Transfer of Care Note  Patient: Tamara Thompson  Procedure(s) Performed: Procedure(s): BILATERAL NASAL ENDOSCOPIC CAUTERIZATION (Bilateral)  Patient Location: PACU  Anesthesia Type:General  Level of Consciousness: awake and alert   Airway & Oxygen Therapy: Patient Spontanous Breathing and Patient connected to face mask oxygen  Post-op Assessment: Report given to RN and Post -op Vital signs reviewed and stable  Post vital signs: Reviewed and stable  Last Vitals:  Filed Vitals:   02/07/15 0815  BP: 138/78  Pulse: 78  Temp: 36.8 C  Resp: 18    Complications: No apparent anesthesia complications

## 2015-02-07 NOTE — Anesthesia Postprocedure Evaluation (Signed)
  Anesthesia Post-op Note  Patient: Tamara Thompson  Procedure(s) Performed: Procedure(s): BILATERAL NASAL ENDOSCOPIC CAUTERIZATION (Bilateral)  Patient Location: PACU  Anesthesia Type:General  Level of Consciousness: awake and alert   Airway and Oxygen Therapy: Patient Spontanous Breathing  Post-op Pain: none  Post-op Assessment: Post-op Vital signs reviewed              Post-op Vital Signs: Reviewed  Last Vitals:  Filed Vitals:   02/07/15 1030  BP:   Pulse: 71  Temp:   Resp: 12    Complications: No apparent anesthesia complications

## 2015-02-07 NOTE — Op Note (Signed)
DATE OF PROCEDURE: 02/07/2015  OPERATIVE REPORT   SURGEON: Leta Baptist, MD  PREOPERATIVE DIAGNOSIS: Bilateral recurrent epistaxis  POSTOPERATIVE DIAGNOSIS: Bilateral recurrent epistaxis  PROCEDURES PERFORMED: Endoscopic control of bilateral recurrent epistaxis   ANESTHESIA: General laryngeal mask anesthesia.  COMPLICATIONS: None.  ESTIMATED BLOOD LOSS: Minimal.  INDICATION FOR PROCEDURE: The patient is a 64 year old female who returns today reporting more bilateral epistaxis.   The patient was last seen 1 month ago.  At that time, she was noted to have hypervascular areas and polypoid tissue on her bilateral nasal septum. She underwent endoscopic cauterization of her left bilateral nasal septum.  According to the patient, she has been experiencing more recurrent epistaxis bilaterally. She was noted to have more hypervascular areas on her nasal septum. The decision was therefore made for her to undergo repeat electrocautery of her nasal septum under general anesthesia in order to achieve longer term control of her recurrent bleeding.  DESCRIPTION OF PROCEDURE: The patient was taken to the operating room and placed supine on the operating table. General laryngeal mask anesthesia was induced by the anesthesiologist.Both nasal cavities are sprayed with topical xylocaine and neo-synephrine.  After adequate anesthesia is achieved, the right nasal cavity is examined with a 0 rigid endoscope.  A suction catheter is inserted in parallel with the 0 endoscope, and it is used to suction the blood clots from the right nasal cavity.  Hypervascular and polypoid tissue are noted at the superior nasal septum.  A electrocautery is inserted in parallel with the 0 endoscope.  It is used to repeatedly cauterized the hypervascular area.  The same procedure is repeated on the left side without exception.  The patient tolerated the procedure well. The care of the patient was turned over to the  anesthesiologist. The patient was awakened from anesthesia without difficulty. She was extubated and transferred to the recovery room in good condition.  OPERATIVE FINDINGS:Hypervascular areas are noted on her nasal septum bilaterally.  SPECIMEN: None.  FOLLOWUP CARE: The patient will be discharged home once she is awake and alert. She will follow up in my office in 1 week.

## 2015-02-07 NOTE — Anesthesia Procedure Notes (Signed)
Procedure Name: LMA Insertion Date/Time: 02/07/2015 9:53 AM Performed by: Lieutenant Diego Pre-anesthesia Checklist: Patient identified, Emergency Drugs available, Suction available and Patient being monitored Patient Re-evaluated:Patient Re-evaluated prior to inductionOxygen Delivery Method: Circle System Utilized Preoxygenation: Pre-oxygenation with 100% oxygen Intubation Type: IV induction Ventilation: Mask ventilation without difficulty LMA: LMA inserted LMA Size: 4.0 Number of attempts: 1 Airway Equipment and Method: Bite block Placement Confirmation: positive ETCO2 and breath sounds checked- equal and bilateral Tube secured with: Tape Dental Injury: Teeth and Oropharynx as per pre-operative assessment

## 2015-02-07 NOTE — H&P (Signed)
Cc: Bilateral recurrent epistaxis  HPI: The patient is a 64 year old female who returns today reporting more bilateral epistaxis.   The patient was last seen 1 week ago.  At that time, she was noted to have hypervascular areas and polypoid tissue within her nasal cavity and superior left nasal septum. She underwent endoscopic cauterization of her left superior nasal septum.  According to the patient, she has been experiencing more recurrent epistaxis bilaterally. The severity has increased over the past 2 days.    She denies any recent facial trauma.    Exam: Both nasal cavities are sprayed with topical xylocaine and neo-synephrine.  After adequate anesthesia is achieved, the right nasal cavity is examined with a 0 rigid endoscope.  A suction catheter is inserted in parallel with the 0 endoscope, and it is used to suction the blood clots from the right nasal cavity.  Hypervascular and polypoid tissue are noted at the superior nasal septum.  A silver nitrate stick is inserted in parallel with the 0 endoscope.  It is used to repeatedly cauterized the hypervascular area.  The same procedure is repeated on the left side without exception.  The patient tolerated the procedure well.  Assessment:  1.  Hypervascular areas and polypoid tissue are noted on the patient's nasal septum bilaterally.  The severity has again increased. 2.  Active bleeding is noted bilaterally when the hypervascular areas are touched.    Plan: 1.  Endoscopic cauterization of bilateral nasal septum.  The granulation tissue is extensively cauterized.  2. The patient is instructed to continue the use of nasal saline gel to prevent nasal dryness.   3. The patient will likely need to undergo repeat electrocautery of her nasal septum under general anesthesia in order to achieve longer term control of her recurrent bleeding.

## 2015-02-07 NOTE — Anesthesia Preprocedure Evaluation (Addendum)
Anesthesia Evaluation  Patient identified by MRN, date of birth, ID band Patient awake    Reviewed: Allergy & Precautions, NPO status , Patient's Chart, lab work & pertinent test results  Airway Mallampati: II  TM Distance: >3 FB Neck ROM: Full    Dental   Pulmonary former smoker,    breath sounds clear to auscultation       Cardiovascular negative cardio ROS   Rhythm:Regular Rate:Normal     Neuro/Psych negative neurological ROS     GI/Hepatic negative GI ROS, Neg liver ROS,   Endo/Other  negative endocrine ROS  Renal/GU negative Renal ROS     Musculoskeletal   Abdominal   Peds  Hematology  (+) anemia ,   Anesthesia Other Findings   Reproductive/Obstetrics                            Anesthesia Physical Anesthesia Plan  ASA: II  Anesthesia Plan: General   Post-op Pain Management:    Induction: Intravenous  Airway Management Planned: Oral ETT  Additional Equipment:   Intra-op Plan:   Post-operative Plan: Extubation in OR  Informed Consent: I have reviewed the patients History and Physical, chart, labs and discussed the procedure including the risks, benefits and alternatives for the proposed anesthesia with the patient or authorized representative who has indicated his/her understanding and acceptance.   Dental advisory given  Plan Discussed with: CRNA  Anesthesia Plan Comments:         Anesthesia Quick Evaluation

## 2015-02-08 ENCOUNTER — Encounter (HOSPITAL_BASED_OUTPATIENT_CLINIC_OR_DEPARTMENT_OTHER): Payer: Self-pay | Admitting: Otolaryngology

## 2015-02-10 ENCOUNTER — Encounter: Payer: Self-pay | Admitting: Obstetrics and Gynecology

## 2015-02-10 ENCOUNTER — Other Ambulatory Visit (HOSPITAL_COMMUNITY)
Admission: RE | Admit: 2015-02-10 | Discharge: 2015-02-10 | Disposition: A | Discharge status: Home / Self Care | Payer: Medicare Other | Source: Ambulatory Visit | Attending: Obstetrics and Gynecology | Admitting: Obstetrics and Gynecology

## 2015-02-10 ENCOUNTER — Ambulatory Visit (INDEPENDENT_AMBULATORY_CARE_PROVIDER_SITE_OTHER): Payer: Medicare Other | Admitting: Obstetrics and Gynecology

## 2015-02-10 VITALS — BP 140/92 | Ht 65.0 in | Wt 174.0 lb

## 2015-02-10 DIAGNOSIS — Z1151 Encounter for screening for human papillomavirus (HPV): Secondary | ICD-10-CM | POA: Insufficient documentation

## 2015-02-10 DIAGNOSIS — Z124 Encounter for screening for malignant neoplasm of cervix: Secondary | ICD-10-CM

## 2015-02-10 DIAGNOSIS — Z01411 Encounter for gynecological examination (general) (routine) with abnormal findings: Secondary | ICD-10-CM | POA: Diagnosis not present

## 2015-02-10 NOTE — Progress Notes (Signed)
[facility] Visit  Patient name: Tamara Thompson MRN [id number]  Date of birth: 18-Mar-1951  CC & HPI:  Tamara Thompson is a 64 y.o. female presenting today for a pap smear. She has no complaints at this time.   ROS:  A complete 10 system review of systems was obtained and all systems are negative except as noted in the HPI and PMH.    Pertinent History Reviewed:   Reviewed: Significant for Cesarean section and ovarian cyst surgery Medical         Past Medical History  Diagnosis Date  . Bleeding from the nose   . Medical history non-contributory   . Deviated septum   . Anemia   . Kidney stones                               Surgical Hx:    Past Surgical History  Procedure Laterality Date  . Ovarian cyst surgery    . Ankle surgery  2010    left  . Cesarean section    . Colonoscopy N/A 12/14/2013    Procedure: COLONOSCOPY;  Surgeon: Danie Binder, MD;  [facility];  Service: Endoscopy;  Laterality: N/A;  10:30 AM  . Nasal septoplasty w/ turbinoplasty Bilateral 02/02/2014    Procedure: RIGHT NASAL SEPTOPLASTY WITH TURBINATE REDUCTION;  Surgeon: Ascencion Dike, MD;  Location: [place];  Service: ENT;  Laterality: Bilateral;  . Polypectomy Right 02/02/2014    Procedure: RIGHT POLYPECTOMY NASAL;  Surgeon: Ascencion Dike, MD;  Location: [place];  Service: ENT;  Laterality: Right;  . Surgery of jaw and cheek fracture    . Nasal endoscopy with epistaxis control Bilateral 02/07/2015    Procedure: BILATERAL NASAL ENDOSCOPIC CAUTERIZATION;  Surgeon: Leta Baptist, MD;  Location: [place];  Service: ENT;  Laterality: Bilateral;   Medications: Reviewed & Updated - see associated section                       Current outpatient prescriptions:  .  ferrous sulfate 325 (65 FE) MG tablet, Take 325 mg by mouth 3 (three) times daily with meals., Disp: , Rfl:  .  sodium chloride (OCEAN) 0.65 % SOLN nasal spray, Place 1 spray into both  nostrils as needed for congestion., Disp: , Rfl:    Social History: Reviewed -  reports that she quit smoking about 5 years ago. Her smoking use included Cigarettes. She has a 62.5 pack-year smoking history. She has never used smokeless tobacco.  Objective Findings:  Vitals: Blood pressure 140/92, height 5' 5"  (1.651 m), weight 174 lb (78.926 kg).  Physical Examination: General appearance - alert, well appearing, and in no distress, oriented to person, place, and time and overweight Mental status - alert, oriented to person, place, and time, normal mood, behavior, speech, dress, motor activity, and thought processes, affect appropriate to mood Pelvic - normal external genitalia VULVA: normal appearing vulva with no masses, tenderness or lesions,  VAGINA: atrophic vaginal tissues with normal color and discharge, no lesions,  CERVIX: normal appearing cervix without discharge or lesions,  UTERUS: uterus is small in size, shape, consistency and nontender,  ADNEXA: normal adnexa in size, nontender and no masses,    Assessment & Plan:   A:  1. Pap smear collected.   P:  1. Return in 3 years for pap smear.   By  signing my name below, I, Stephania Fragmin, attest that this documentation has been prepared under the direction and in the presence of Jonnie Kind, MD. Electronically Signed: Stephania Fragmin, ED Scribe. 02/10/2015. 3:56 PM.   I personally performed the services described in this documentation, which was SCRIBED in my presence. The recorded information has been reviewed and considered accurate. It has been edited as necessary during review. Jonnie Kind, MD   (scribe attestation statement)

## 2015-02-10 NOTE — Progress Notes (Signed)
Patient ID: Tamara Thompson, female   DOB: 1950-11-01, 64 y.o.   MRN: [id number] Pt here today for a "check up". Pt needs a PAP ONLY! Pt to have physical at [facility].

## 2015-02-11 ENCOUNTER — Ambulatory Visit (HOSPITAL_COMMUNITY)
Admission: RE | Admit: 2015-02-11 | Discharge: 2015-02-11 | Disposition: A | Discharge status: Home / Self Care | Payer: Medicare Other | Source: Ambulatory Visit | Attending: Urology | Admitting: Urology

## 2015-02-11 DIAGNOSIS — N200 Calculus of kidney: Secondary | ICD-10-CM | POA: Diagnosis not present

## 2015-02-11 DIAGNOSIS — N281 Cyst of kidney, acquired: Secondary | ICD-10-CM | POA: Insufficient documentation

## 2015-02-14 LAB — CYTOLOGY - PAP

## 2015-02-17 ENCOUNTER — Ambulatory Visit (INDEPENDENT_AMBULATORY_CARE_PROVIDER_SITE_OTHER): Payer: Medicare Other | Admitting: Otolaryngology

## 2015-02-17 DIAGNOSIS — R040 Epistaxis: Secondary | ICD-10-CM

## 2015-02-18 ENCOUNTER — Ambulatory Visit (INDEPENDENT_AMBULATORY_CARE_PROVIDER_SITE_OTHER): Payer: Medicare Other | Admitting: Urology

## 2015-02-18 DIAGNOSIS — N281 Cyst of kidney, acquired: Secondary | ICD-10-CM

## 2015-02-18 DIAGNOSIS — N200 Calculus of kidney: Secondary | ICD-10-CM

## 2015-03-01 DIAGNOSIS — E663 Overweight: Secondary | ICD-10-CM | POA: Diagnosis not present

## 2015-03-01 DIAGNOSIS — Z23 Encounter for immunization: Secondary | ICD-10-CM | POA: Diagnosis not present

## 2015-03-01 DIAGNOSIS — Z79899 Other long term (current) drug therapy: Secondary | ICD-10-CM | POA: Diagnosis not present

## 2015-03-01 DIAGNOSIS — J309 Allergic rhinitis, unspecified: Secondary | ICD-10-CM | POA: Diagnosis not present

## 2015-03-01 DIAGNOSIS — E611 Iron deficiency: Secondary | ICD-10-CM | POA: Diagnosis not present

## 2015-03-01 DIAGNOSIS — R5383 Other fatigue: Secondary | ICD-10-CM | POA: Diagnosis not present

## 2015-03-01 DIAGNOSIS — Z1389 Encounter for screening for other disorder: Secondary | ICD-10-CM | POA: Diagnosis not present

## 2015-03-01 DIAGNOSIS — E559 Vitamin D deficiency, unspecified: Secondary | ICD-10-CM | POA: Diagnosis not present

## 2015-03-01 DIAGNOSIS — Z6828 Body mass index (BMI) 28.0-28.9, adult: Secondary | ICD-10-CM | POA: Diagnosis not present

## 2015-03-01 DIAGNOSIS — D509 Iron deficiency anemia, unspecified: Secondary | ICD-10-CM | POA: Diagnosis not present

## 2015-03-03 ENCOUNTER — Ambulatory Visit (INDEPENDENT_AMBULATORY_CARE_PROVIDER_SITE_OTHER): Payer: Medicare Other | Admitting: Otolaryngology

## 2015-03-03 DIAGNOSIS — R040 Epistaxis: Secondary | ICD-10-CM | POA: Diagnosis not present

## 2015-03-31 ENCOUNTER — Ambulatory Visit (INDEPENDENT_AMBULATORY_CARE_PROVIDER_SITE_OTHER): Payer: Medicare Other | Admitting: Otolaryngology

## 2015-03-31 DIAGNOSIS — R040 Epistaxis: Secondary | ICD-10-CM | POA: Diagnosis not present

## 2015-04-11 DIAGNOSIS — R740 Nonspecific elevation of levels of transaminase and lactic acid dehydrogenase [LDH]: Secondary | ICD-10-CM | POA: Diagnosis not present

## 2015-04-11 DIAGNOSIS — K7689 Other specified diseases of liver: Secondary | ICD-10-CM | POA: Diagnosis not present

## 2015-05-26 ENCOUNTER — Ambulatory Visit (INDEPENDENT_AMBULATORY_CARE_PROVIDER_SITE_OTHER): Payer: Medicare Other | Admitting: Otolaryngology

## 2015-05-26 DIAGNOSIS — R040 Epistaxis: Secondary | ICD-10-CM

## 2015-07-28 ENCOUNTER — Ambulatory Visit (INDEPENDENT_AMBULATORY_CARE_PROVIDER_SITE_OTHER): Payer: Medicare Other | Admitting: Otolaryngology

## 2015-07-28 DIAGNOSIS — J343 Hypertrophy of nasal turbinates: Secondary | ICD-10-CM

## 2015-07-28 DIAGNOSIS — J310 Chronic rhinitis: Secondary | ICD-10-CM

## 2015-07-28 DIAGNOSIS — R040 Epistaxis: Secondary | ICD-10-CM | POA: Diagnosis not present

## 2015-10-07 IMAGING — US US BREAST LTD UNI RIGHT INC AXILLA
1 series · 4 of 4 positions shown · non-contrast
Comparison: Screening mammogram from 11/16/2013.

CLINICAL DATA: Screening recall for multiple bilateral breast
asymmetry/possible masses (5 in the right breast and 2 in the left
breast).

EXAM:
DIGITAL DIAGNOSTIC  BILATERAL MAMMOGRAM WITH CAD
ULTRASOUND BILATERAL BREAST

[Series 1: us breast ltd uni right inc axilla · 0.05mm/px · 4 of 4 slices shown]
[im 1/4]
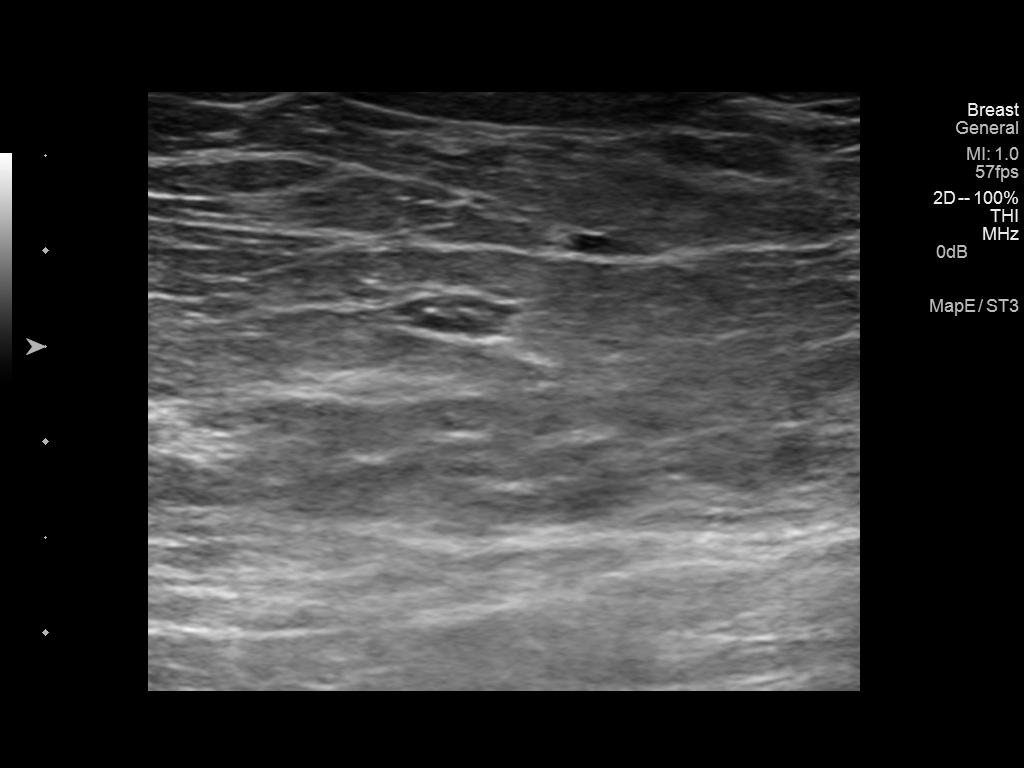
[im 2/4]
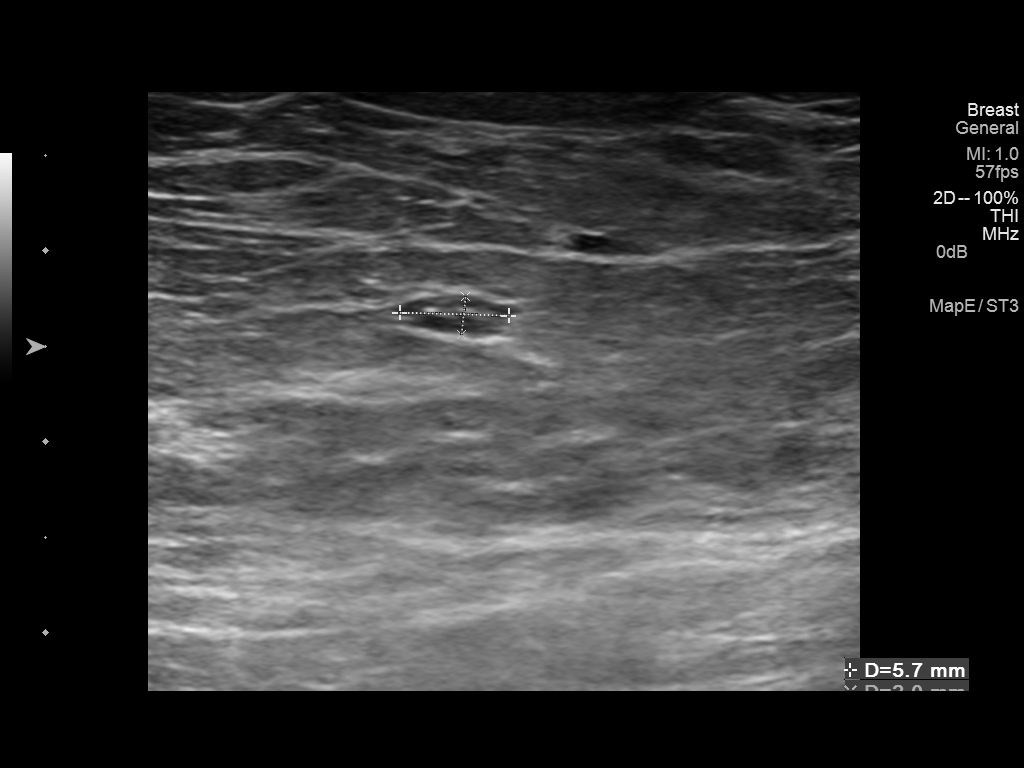
[im 3/4]
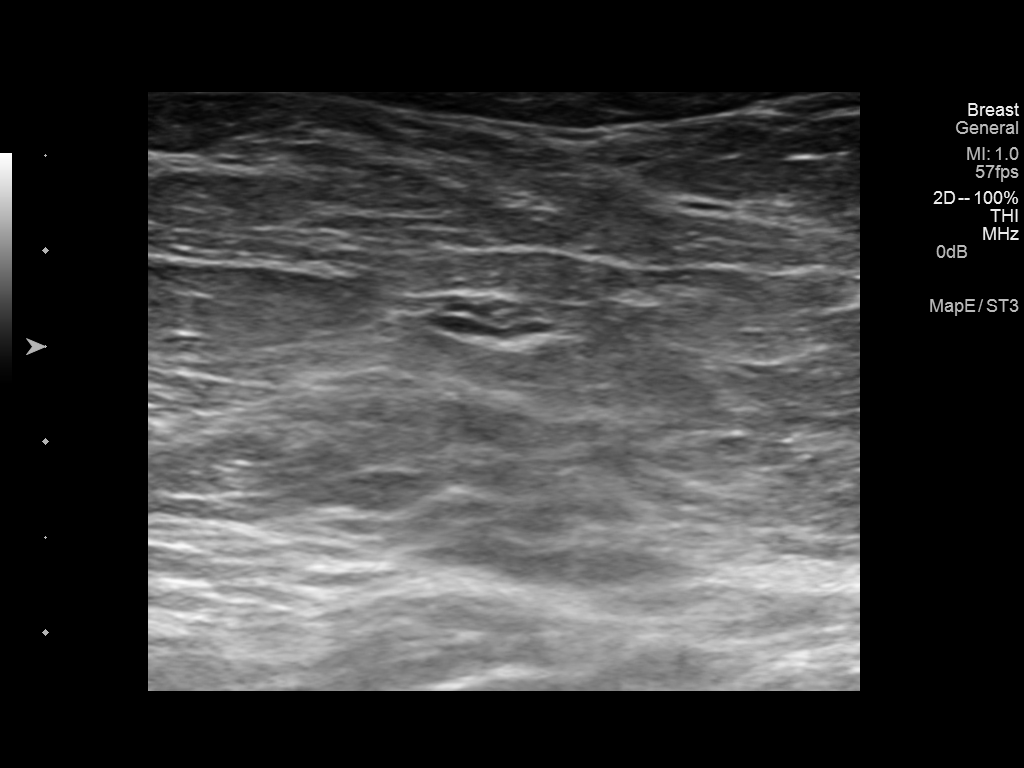
[im 4/4]
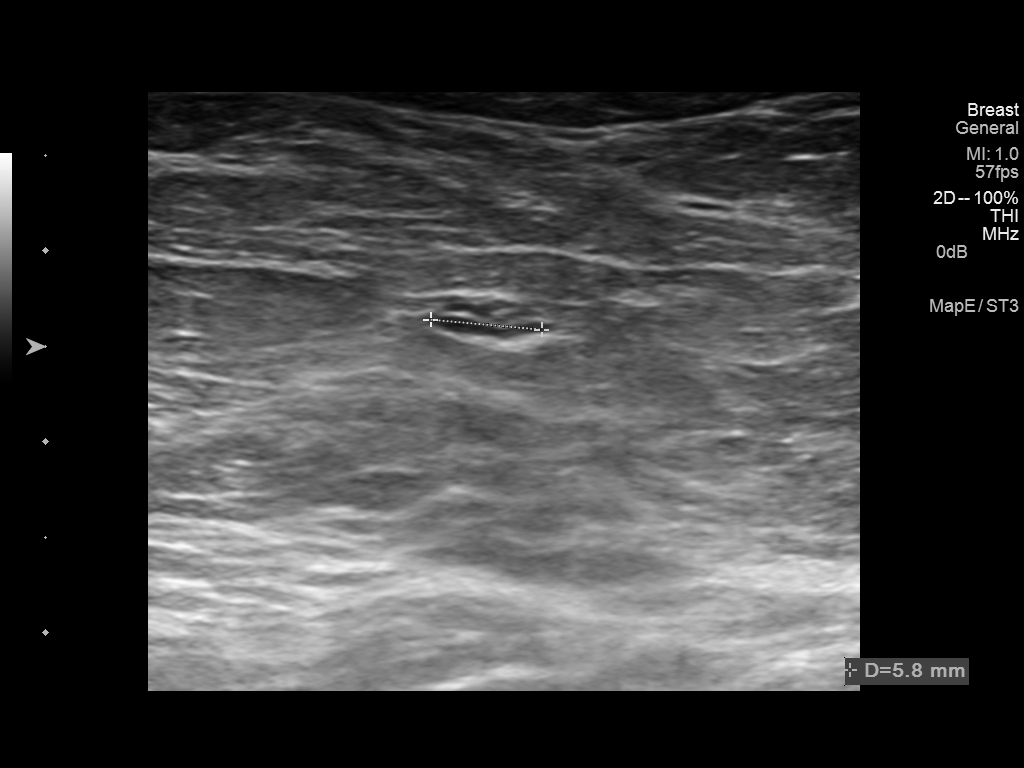

[4 of 4 positions shown; findings below may reference images not displayed]

ACR Breast Density Category b: There are scattered areas of
fibroglandular density.
FINDINGS: There is a small mass in the outer right breast confirmed on the
additional spot compression views measuring approximately 7 mm.
There are persistent asymmetry/possible masses in the central to
inner right breast, both of which measures 5 mm approximately 5 mm.

There is a persistent asymmetry/possible mass in the medial left
breast which appears to contain some internal fat suggestive of an
intramammary lymph node and measures approximately 9 mm.

Mammographic images were processed with CAD.

Physical examination of the outer and inner right breast does not
reveal any palpable masses. Physical examination of the inner left
breast does not reveal any palpable masses.

Targeted ultrasound of the right breast was performed demonstrating
a small internal mammary lymph node at 9 o'clock 4 cm from the
nipple measuring 0.6 x 0.2 x 0.6 cm. This likely corresponds with 1
of the small masses seen in the outer right breast on mammography.
Outer and central right breast was scanned with no additional
findings seen.

Targeted ultrasound of the left breast was performed demonstrating
an oval circumscribed hypoechoic mass at 7 o'clock 6 cm from the
nipple measuring 0.4 x 0.2 x 0.3 cm. This may but not definitely
correspond with 1 of the masses seen in the left breast on
mammography.
IMPRESSION: Probably benign bilateral breast masses/asymmetries, most of which
are seen on mammography only.

RECOMMENDATION:
A six-month followup diagnostic mammography of the bilateral breasts
to demonstrate stability of the multiple bilateral breast
masses/asymmetries.

I have discussed the findings and recommendations with the patient.
Results were also provided in writing at the conclusion of the
visit. If applicable, a reminder letter will be sent to the patient
regarding the next appointment.

BI-RADS CATEGORY  3: Probably benign.

## 2015-10-17 DIAGNOSIS — S93402A Sprain of unspecified ligament of left ankle, initial encounter: Secondary | ICD-10-CM | POA: Diagnosis not present

## 2015-10-19 DIAGNOSIS — S93492A Sprain of other ligament of left ankle, initial encounter: Secondary | ICD-10-CM | POA: Diagnosis not present

## 2015-10-19 DIAGNOSIS — Z9689 Presence of other specified functional implants: Secondary | ICD-10-CM | POA: Diagnosis not present

## 2015-11-03 ENCOUNTER — Ambulatory Visit (INDEPENDENT_AMBULATORY_CARE_PROVIDER_SITE_OTHER): Payer: Medicare Other | Admitting: Otolaryngology

## 2015-11-03 DIAGNOSIS — R040 Epistaxis: Secondary | ICD-10-CM

## 2015-11-03 DIAGNOSIS — J310 Chronic rhinitis: Secondary | ICD-10-CM

## 2015-11-16 ENCOUNTER — Other Ambulatory Visit (HOSPITAL_COMMUNITY): Payer: Self-pay | Admitting: Internal Medicine

## 2015-11-16 DIAGNOSIS — Z1231 Encounter for screening mammogram for malignant neoplasm of breast: Secondary | ICD-10-CM

## 2015-12-01 ENCOUNTER — Ambulatory Visit (HOSPITAL_COMMUNITY)
Admission: RE | Admit: 2015-12-01 | Discharge: 2015-12-01 | Disposition: A | Discharge status: Home / Self Care | Payer: Medicare Other | Source: Ambulatory Visit | Attending: Internal Medicine | Admitting: Internal Medicine

## 2015-12-01 DIAGNOSIS — Z1231 Encounter for screening mammogram for malignant neoplasm of breast: Secondary | ICD-10-CM

## 2016-02-08 ENCOUNTER — Ambulatory Visit (HOSPITAL_COMMUNITY)
Admission: RE | Admit: 2016-02-08 | Discharge: 2016-02-08 | Disposition: A | Discharge status: Home / Self Care | Payer: Medicare Other | Source: Ambulatory Visit | Attending: Urology | Admitting: Urology

## 2016-02-08 ENCOUNTER — Other Ambulatory Visit: Payer: Self-pay | Admitting: Urology

## 2016-02-08 DIAGNOSIS — N200 Calculus of kidney: Secondary | ICD-10-CM | POA: Diagnosis not present

## 2016-02-08 DIAGNOSIS — Z23 Encounter for immunization: Secondary | ICD-10-CM | POA: Diagnosis not present

## 2016-02-08 DIAGNOSIS — N2889 Other specified disorders of kidney and ureter: Secondary | ICD-10-CM | POA: Diagnosis not present

## 2016-02-21 DIAGNOSIS — N200 Calculus of kidney: Secondary | ICD-10-CM | POA: Diagnosis not present

## 2016-02-28 DIAGNOSIS — E785 Hyperlipidemia, unspecified: Secondary | ICD-10-CM | POA: Diagnosis not present

## 2016-02-28 DIAGNOSIS — Z0000 Encounter for general adult medical examination without abnormal findings: Secondary | ICD-10-CM | POA: Diagnosis not present

## 2016-02-28 DIAGNOSIS — R5383 Other fatigue: Secondary | ICD-10-CM | POA: Diagnosis not present

## 2016-02-28 DIAGNOSIS — Z6830 Body mass index (BMI) 30.0-30.9, adult: Secondary | ICD-10-CM | POA: Diagnosis not present

## 2016-02-28 DIAGNOSIS — E669 Obesity, unspecified: Secondary | ICD-10-CM | POA: Diagnosis not present

## 2016-02-28 DIAGNOSIS — E559 Vitamin D deficiency, unspecified: Secondary | ICD-10-CM | POA: Diagnosis not present

## 2016-02-28 DIAGNOSIS — M545 Low back pain: Secondary | ICD-10-CM | POA: Diagnosis not present

## 2016-02-28 DIAGNOSIS — Z1389 Encounter for screening for other disorder: Secondary | ICD-10-CM | POA: Diagnosis not present

## 2016-02-28 DIAGNOSIS — E6609 Other obesity due to excess calories: Secondary | ICD-10-CM | POA: Diagnosis not present

## 2016-03-02 ENCOUNTER — Other Ambulatory Visit (HOSPITAL_COMMUNITY): Payer: Self-pay | Admitting: Internal Medicine

## 2016-03-02 DIAGNOSIS — R945 Abnormal results of liver function studies: Secondary | ICD-10-CM

## 2016-03-05 DIAGNOSIS — R945 Abnormal results of liver function studies: Secondary | ICD-10-CM | POA: Diagnosis not present

## 2016-03-05 DIAGNOSIS — D649 Anemia, unspecified: Secondary | ICD-10-CM | POA: Diagnosis not present

## 2016-03-05 DIAGNOSIS — E748 Other specified disorders of carbohydrate metabolism: Secondary | ICD-10-CM | POA: Diagnosis not present

## 2016-03-06 ENCOUNTER — Encounter: Payer: Self-pay | Admitting: Cardiovascular Disease

## 2016-03-06 ENCOUNTER — Ambulatory Visit (INDEPENDENT_AMBULATORY_CARE_PROVIDER_SITE_OTHER): Payer: Medicare Other | Admitting: Cardiovascular Disease

## 2016-03-06 VITALS — BP 138/78 | HR 89 | Ht 65.0 in | Wt 182.0 lb

## 2016-03-06 DIAGNOSIS — R011 Cardiac murmur, unspecified: Secondary | ICD-10-CM | POA: Diagnosis not present

## 2016-03-06 NOTE — Progress Notes (Signed)
Cardiology Office Note   Date:  03/06/2016   ID:  Tamara Thompson, DOB 1950/08/15, MRN [id number]  PCP:  Glo Herring., MD  Cardiologist:   Jenkins Rouge, MD   No chief complaint on file.     History of Present Illness: Tamara Thompson is a 65 y.o. female who presents for evaluation of murmur. She indicates Dr Gerarda Fraction Mentioned this 2 years ago but not last year. Last visit in setting of anemia noted again. No cardiac symptoms No dyspnea, palpitations, chest pain or syncope. 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  Active walking no limitations. Had a colonoscopy 2.5 years ago ok. Has guaic cards now and may Have f/u colonoscopy early next year Back on iron  No history of connective tissue dx No history of rheumatic heart dx No history of Fen/Phen diet pill use    03/01/15:  TC 300 HDL 49  LDL 210 Triglycerides 204  02/28/16   TC 223  HDL 49  LDL 145 Triglycerides 143  Anemic Hct 29.3  CR .74 K 4.5  SGT 51 SGPT 99  TSH .36     Past Medical History:  Diagnosis Date  . Anemia   . Bleeding from the nose   . Deviated septum   . Kidney stones   . Medical history non-contributory     Past Surgical History:  Procedure Laterality Date  . ANKLE SURGERY  2010   left  . CESAREAN SECTION    . COLONOSCOPY N/A 12/14/2013   Procedure: COLONOSCOPY;  Surgeon: Danie Binder, MD;  [facility];  Service: Endoscopy;  Laterality: N/A;  10:30 AM  . NASAL ENDOSCOPY WITH EPISTAXIS CONTROL Bilateral 02/07/2015   Procedure: BILATERAL NASAL ENDOSCOPIC CAUTERIZATION;  Surgeon: Leta Baptist, MD;  Location: [place];  Service: ENT;  Laterality: Bilateral;  . NASAL SEPTOPLASTY W/ TURBINOPLASTY Bilateral 02/02/2014   Procedure: RIGHT NASAL SEPTOPLASTY WITH TURBINATE REDUCTION;  Surgeon: Ascencion Dike, MD;  Location: [place];  Service: ENT;  Laterality: Bilateral;  . OVARIAN CYST SURGERY    . POLYPECTOMY Right 02/02/2014   Procedure: RIGHT POLYPECTOMY NASAL;  Surgeon: Ascencion Dike, MD;  Location: [place];  Service: ENT;  Laterality: Right;  . Surgery of jaw and cheek fracture       Current Outpatient Prescriptions  Medication Sig Dispense Refill  . cholecalciferol (VITAMIN D) 400 units TABS tablet Take 2,000 Units by mouth.    . co-enzyme Q-10 30 MG capsule Take 100 mg by mouth daily.    . Garlic 2585 MG CAPS Take 1,200 mg by mouth.     No current facility-administered medications for this visit.     Allergies:   Penicillins and Percocet [oxycodone-acetaminophen]    Social History:  The patient  reports that she quit smoking about 6 years ago. Her smoking use included Cigarettes. She has a 62.50 pack-year smoking history. She has never used smokeless tobacco. She reports that she drinks alcohol. She reports that she does not use drugs.   Family History:  The patient's family history includes Cancer in her father; Heart disease in her mother; Stroke in her father.    ROS:  Please see the history of present illness.   Otherwise, review of systems are positive for none.   All other systems are reviewed and negative.    PHYSICAL EXAM: VS:  BP 138/78   Pulse 89   Ht 5' 5"  (1.651  m)   Wt 82.6 kg (182 lb)   SpO2 94%   BMI 30.29 kg/m  , BMI Body mass index is 30.29 kg/m. Affect appropriate Pale german female  HEENT: normal Neck supple with no adenopathy JVP normal no bruits no thyromegaly Lungs clear with no wheezing and good diaphragmatic motion Heart:  S1/S2 SEM  murmur, no rub, gallop or click PMI normal Abdomen: benighn, BS positve, no tenderness, no AAA no bruit.  No HSM or HJR Distal pulses intact with no bruits No edema Neuro non-focal Skin warm and dry No muscular weakness    EKG:  None    Recent Labs: No results found for requested labs within last 8760 hours.    Lipid Panel No results found for: CHOL, TRIG, HDL, CHOLHDL, VLDL, LDLCALC, LDLDIRECT    Wt  Readings from Last 3 Encounters:  03/06/16 82.6 kg (182 lb)  02/10/15 78.9 kg (174 lb)  02/07/15 79.4 kg (175 lb)      Other studies Reviewed: Additional studies/ records that were reviewed today include: Notes Dr Stormy Fabian .    ASSESSMENT AND PLAN:  1.  Murmur: benign sounding AV sclerosis f/u echo no need for SBE 2. Anemia: continue iron guaic cards f/u colonoscopy per primary   Current medicines are reviewed at length with the patient today.  The patient does not have concerns regarding medicines.  The following changes have been made:  no change  Labs/ tests ordered today include: Echo  No orders of the defined types were placed in this encounter.    Disposition:   FU with me in a year      Signed, Jenkins Rouge, MD  03/06/2016 1:11 PM    [facility] [place], [place], [place]  [address] Phone: [phone]; Fax: [phone]

## 2016-03-06 NOTE — Patient Instructions (Signed)
Medication Instructions:  Your physician recommends that you continue on your current medications as directed. Please refer to the Current Medication list given to you today.   Labwork: NONE  Testing/Procedures: Your physician has requested that you have an echocardiogram. Echocardiography is a painless test that uses sound waves to create images of your heart. It provides your doctor with information about the size and shape of your heart and how well your heart's chambers and valves are working. This procedure takes approximately one hour. There are no restrictions for this procedure.    Follow-Up: Your physician wants you to follow-up in: 1 YEAR.  You will receive a reminder letter in the mail two months in advance. If you don't receive a letter, please call our office to schedule the follow-up appointment.   Any Other Special Instructions Will Be Listed Below (If Applicable).     If you need a refill on your cardiac medications before your next appointment, please call your pharmacy.

## 2016-03-07 ENCOUNTER — Ambulatory Visit (HOSPITAL_COMMUNITY)
Admission: RE | Admit: 2016-03-07 | Discharge: 2016-03-07 | Disposition: A | Discharge status: Home / Self Care | Payer: Medicare Other | Source: Ambulatory Visit | Attending: Internal Medicine | Admitting: Internal Medicine

## 2016-03-07 DIAGNOSIS — K810 Acute cholecystitis: Secondary | ICD-10-CM | POA: Insufficient documentation

## 2016-03-07 DIAGNOSIS — N200 Calculus of kidney: Secondary | ICD-10-CM | POA: Insufficient documentation

## 2016-03-07 DIAGNOSIS — I77811 Abdominal aortic ectasia: Secondary | ICD-10-CM | POA: Diagnosis not present

## 2016-03-07 DIAGNOSIS — Z1211 Encounter for screening for malignant neoplasm of colon: Secondary | ICD-10-CM | POA: Diagnosis not present

## 2016-03-07 DIAGNOSIS — N281 Cyst of kidney, acquired: Secondary | ICD-10-CM | POA: Diagnosis not present

## 2016-03-07 DIAGNOSIS — K760 Fatty (change of) liver, not elsewhere classified: Secondary | ICD-10-CM | POA: Diagnosis not present

## 2016-03-07 DIAGNOSIS — R945 Abnormal results of liver function studies: Secondary | ICD-10-CM | POA: Diagnosis not present

## 2016-03-20 DIAGNOSIS — G8929 Other chronic pain: Secondary | ICD-10-CM | POA: Diagnosis not present

## 2016-03-20 DIAGNOSIS — M545 Low back pain: Secondary | ICD-10-CM | POA: Diagnosis not present

## 2016-03-23 ENCOUNTER — Other Ambulatory Visit: Payer: Self-pay | Admitting: Orthopedic Surgery

## 2016-03-23 DIAGNOSIS — M5442 Lumbago with sciatica, left side: Principal | ICD-10-CM

## 2016-03-23 DIAGNOSIS — M5441 Lumbago with sciatica, right side: Secondary | ICD-10-CM

## 2016-03-26 ENCOUNTER — Ambulatory Visit (HOSPITAL_COMMUNITY)
Admission: RE | Admit: 2016-03-26 | Discharge: 2016-03-26 | Disposition: A | Discharge status: Home / Self Care | Payer: Medicare Other | Source: Ambulatory Visit | Attending: Cardiovascular Disease | Admitting: Cardiovascular Disease

## 2016-03-26 ENCOUNTER — Other Ambulatory Visit: Payer: Self-pay | Admitting: Orthopedic Surgery

## 2016-03-26 DIAGNOSIS — D649 Anemia, unspecified: Secondary | ICD-10-CM | POA: Insufficient documentation

## 2016-03-26 DIAGNOSIS — R011 Cardiac murmur, unspecified: Secondary | ICD-10-CM | POA: Insufficient documentation

## 2016-03-26 DIAGNOSIS — M545 Low back pain, unspecified: Secondary | ICD-10-CM

## 2016-03-26 DIAGNOSIS — G8929 Other chronic pain: Secondary | ICD-10-CM

## 2016-03-26 DIAGNOSIS — Z87891 Personal history of nicotine dependence: Secondary | ICD-10-CM | POA: Insufficient documentation

## 2016-03-26 DIAGNOSIS — I071 Rheumatic tricuspid insufficiency: Secondary | ICD-10-CM | POA: Diagnosis not present

## 2016-03-26 NOTE — Progress Notes (Signed)
*  PRELIMINARY RESULTS* Echocardiogram 2D Echocardiogram has been performed.  Leavy Cella 03/26/2016, 2:13 PM

## 2016-03-27 ENCOUNTER — Ambulatory Visit (HOSPITAL_COMMUNITY)
Admission: RE | Admit: 2016-03-27 | Discharge: 2016-03-27 | Disposition: A | Discharge status: Home / Self Care | Payer: Medicare Other | Source: Ambulatory Visit | Attending: Orthopedic Surgery | Admitting: Orthopedic Surgery

## 2016-03-27 DIAGNOSIS — G8929 Other chronic pain: Secondary | ICD-10-CM | POA: Diagnosis not present

## 2016-03-27 DIAGNOSIS — M4856XA Collapsed vertebra, not elsewhere classified, lumbar region, initial encounter for fracture: Secondary | ICD-10-CM | POA: Diagnosis not present

## 2016-03-27 DIAGNOSIS — M545 Low back pain: Secondary | ICD-10-CM | POA: Insufficient documentation

## 2016-04-02 DIAGNOSIS — M545 Low back pain: Secondary | ICD-10-CM | POA: Diagnosis not present

## 2016-04-02 DIAGNOSIS — G8929 Other chronic pain: Secondary | ICD-10-CM | POA: Diagnosis not present

## 2016-04-30 ENCOUNTER — Ambulatory Visit (INDEPENDENT_AMBULATORY_CARE_PROVIDER_SITE_OTHER): Payer: Medicare Other | Admitting: Otolaryngology

## 2016-04-30 DIAGNOSIS — R040 Epistaxis: Secondary | ICD-10-CM | POA: Diagnosis not present

## 2016-05-07 ENCOUNTER — Ambulatory Visit (HOSPITAL_COMMUNITY): Payer: Medicare Other | Admitting: Physical Therapy

## 2016-05-15 ENCOUNTER — Ambulatory Visit (HOSPITAL_COMMUNITY): Payer: Medicare Other | Admitting: Physical Therapy

## 2016-05-24 ENCOUNTER — Ambulatory Visit (HOSPITAL_COMMUNITY): Payer: Medicare Other | Attending: Orthopedic Surgery | Admitting: Physical Therapy

## 2016-05-24 DIAGNOSIS — G8929 Other chronic pain: Secondary | ICD-10-CM | POA: Insufficient documentation

## 2016-05-24 DIAGNOSIS — R29898 Other symptoms and signs involving the musculoskeletal system: Secondary | ICD-10-CM | POA: Insufficient documentation

## 2016-05-24 DIAGNOSIS — R293 Abnormal posture: Secondary | ICD-10-CM | POA: Insufficient documentation

## 2016-05-24 DIAGNOSIS — M545 Low back pain: Secondary | ICD-10-CM | POA: Diagnosis not present

## 2016-05-24 DIAGNOSIS — M6281 Muscle weakness (generalized): Secondary | ICD-10-CM | POA: Insufficient documentation

## 2016-05-24 NOTE — Therapy (Signed)
[place], [place], [address] Phone: [phone]   Fax:  [phone]  Physical Therapy Evaluation  Patient Details  Name: Tamara Thompson MRN: [id number] Date of Birth: 04-Jul-1950 Referring Provider: Melina Schools   Encounter Date: 05/24/2016      PT End of Session - 05/24/16 1628    Visit Number 1   Number of Visits 13   Date for PT Re-Evaluation 06/14/16   Authorization Type Medicare part A and B    Authorization Time Period 05/24/16 to 07/05/16   Authorization - Visit Number 1   Authorization - Number of Visits 10   PT Start Time 7564   PT Stop Time 1515   PT Time Calculation (min) 42 min   Activity Tolerance Patient tolerated treatment well   Behavior During Therapy [facility] for tasks assessed/performed      Past Medical History:  Diagnosis Date  . Anemia   . Bleeding from the nose   . Deviated septum   . Kidney stones   . Medical history non-contributory     Past Surgical History:  Procedure Laterality Date  . ANKLE SURGERY  2010   left  . CESAREAN SECTION    . COLONOSCOPY N/A 12/14/2013   Procedure: COLONOSCOPY;  Surgeon: Danie Binder, MD;  [facility];  Service: Endoscopy;  Laterality: N/A;  10:30 AM  . NASAL ENDOSCOPY WITH EPISTAXIS CONTROL Bilateral 02/07/2015   Procedure: BILATERAL NASAL ENDOSCOPIC CAUTERIZATION;  Surgeon: Leta Baptist, MD;  Location: [place];  Service: ENT;  Laterality: Bilateral;  . NASAL SEPTOPLASTY W/ TURBINOPLASTY Bilateral 02/02/2014   Procedure: RIGHT NASAL SEPTOPLASTY WITH TURBINATE REDUCTION;  Surgeon: Ascencion Dike, MD;  Location: [place];  Service: ENT;  Laterality: Bilateral;  . OVARIAN CYST SURGERY    . POLYPECTOMY Right 02/02/2014   Procedure: RIGHT POLYPECTOMY NASAL;  Surgeon: Ascencion Dike, MD;  Location: [place];  Service: ENT;  Laterality: Right;  . Surgery of jaw and cheek fracture      There were no  vitals filed for this visit.       Subjective Assessment - 05/24/16 1436    Subjective Patient states that her back is really bad in the morning and the evening, doing tasks like vacuuming throughout the day will also make it worse. She has been told she has DDD, her MD has told her to try PT before surgery. Vacuuming/sweeping/bending over is the hardest thing for her to do right now. She needs to rest a little thoughout her day to manage the pain. No numbness or tingling but sometimes her pain will radiate to her R hip, she has not noticed any specific activities that cause this radiation. No falls or close calls recently.    Pertinent History history of L ankle fracture repair surgery 2010, no other significant PMH/PSH    How long can you sit comfortably? 2/1- she is most comfortable in sitting when sitting with one leg crossed over the arm of the chair, fairly unlimited in general    How long can you stand comfortably? 2/1- most of the time she is fine    How long can you walk comfortably? 2/1- can get through most of [employer] before pain starts    Patient Stated Goals alleviate pain, get HEP    Currently in Pain? Yes  7-8 at worst    Pain Score 3    Pain Location Back  Pain Orientation Lower   Pain Descriptors / Indicators Aching;Discomfort   Pain Type Chronic pain   Pain Radiating Towards down R LE to hip    Pain Onset More than a month ago   Pain Frequency Constant   Aggravating Factors  bending over, vacuuming, sweeping, making bed    Pain Relieving Factors walking around in the morning, sitting with good posture    Effect of Pain on Daily Activities she can do everything she needs if she can go at her own pace             [facility] PT Assessment - 05/24/16 0001      Assessment   Medical Diagnosis DDD    Referring Provider Melina Schools    Onset Date/Surgical Date --  chronic    Next MD Visit Dr. Rolena Infante not scheduled/if needed    Prior Therapy none      Balance Screen    Has the patient fallen in the past 6 months No   Has the patient had a decrease in activity level because of a fear of falling?  No   Is the patient reluctant to leave their home because of a fear of falling?  No     Prior Function   Level of Independence Independent;Independent with basic ADLs;Independent with gait;Independent with transfers   Vocation Retired     AROM   Lumbar Flexion WFL, no symptom exacerbation    Lumbar Extension WFL, increasing pain   Lumbar - Right Side Bend fingertips just above knee jointline , painful    Lumbar - Left Side Bend fingertips just above knee jointline, painful      Strength   Right Hip Flexion 4+/5   Right Hip Extension 4/5   Right Hip ABduction 4/5   Left Hip Flexion 4+/5   Left Hip Extension 4/5   Left Hip ABduction 4+/5   Right Knee Flexion 5/5   Right Knee Extension 5/5   Left Knee Flexion 4+/5   Left Knee Extension 5/5   Right Ankle Dorsiflexion 5/5   Left Ankle Dorsiflexion 5/5     Palpation   Spinal mobility severe hypomobility noted with lumbar and sacral PAs   Palpation comment mild irritability noted paraspnial muscles and R gltues/piriformis      Ambulation/Gait   Gait Comments proximal weakness, lumbar stiffness     6 minute walk test results    Aerobic Endurance Distance Walked 645   Endurance additional comments 3MWT      High Level Balance   High Level Balance Comments TUG 7.93                           PT Education - 05/24/16 1622    Education provided Yes   Education Details prognosis, HEP, POC; general explanation of lumbar spine terms including DDD and "degeneration", importance of HEP compliance    Person(s) Educated Patient;Child(ren)   Methods Explanation;Demonstration;Other (comment)   Comprehension Verbalized understanding;Returned demonstration;Need further instruction          PT Short Term Goals - 05/24/16 1632      PT SHORT TERM GOAL #1   Title Patient to be able to  maintain correct posture at least 70% of the time in order to improve mechanics and reduce pain    Time 3   Period Weeks   Status New     PT SHORT TERM GOAL #2   Title Patient to experience pain no  more than 4/10 at worst in order to improve QOL and functional task performance skills    Time 3   Period Weeks   Status New     PT SHORT TERM GOAL #3   Title Patient to be able to perform all functional mobility, including sit to supine/supine to sit, with correct mechanics in order to protect lumbar spine and prevent pain exacerbation    Time 3   Period Weeks   Status New     PT SHORT TERM GOAL #4   Title Patient to correctly and consistently perform targeted HEP, to be updated as appropriate    Time 1   Period Weeks   Status New           PT Long Term Goals - 05/24/16 1633      PT LONG TERM GOAL #1   Title Patient to demonstrate functional strength as being 5/5 in all tested muscle groups in order to improve mechanics and tolerance to extended activities    Time 6   Period Weeks   Status New     PT LONG TERM GOAL #2   Title Patient to experience pain no more than 2/10 at worst in order to improve QOL and tolerance to extended functional tasks    Time 6   Period Weeks   Status New     PT LONG TERM GOAL #3   Title Patient to demonstrate correct functional lifting mechanics (floor to waist height and turning with load), as well as good mechanics for tasks she performs around her home in order to prevent re-exacerbation of pain    Time 6   Period Weeks   Status New     PT LONG TERM GOAL #4   Title Patient to demonstrate improvement in mobilty of spine as evidenced by only 40% limitation in lumbar and sacral PA end feels in order to assist in pain reduction and improved overall mechancis    Time 6   Period Weeks   Status New               Plan - 05/24/16 1629    Clinical Impression Statement Patient arrives with on-going low back pain, which she reports is  worse in the morning and evening, also seems to worsen as she performs tasks such as sweeping, vacuuming, making the bed, etc in which she is in a bent position. The pain does not stop her from doing anything as long as she can go at her own pace, but it is impairing her QOL. Examination reveals postural deficits and poor core strength, mild gait impairment, functional weakness, and quite severe lumbar and sacral hypomobility with PA testing. Patient was educated regarding general spinal terminology- IE, terminology regarding DDD and "Degeneration" in the spine. Assigned appropriate HEP which patient was able to perform well today; advised that if she has radiating pains to reduce extent of motion, or stop if it continues to worsen, at home. Recommend skilled PT services in order to address functional limitations and assist in reaching optimal level of function.    Rehab Potential Good   Clinical Impairments Affecting Rehab Potential (+) motivated to participate in PLOF, relatively high PLOF; (-) sedentary lifestyle, chronicity of pain    PT Frequency 2x / week  if she is doing well at re-assess, we may drop to 1x/week. TBD.    PT Duration 6 weeks   PT Treatment/Interventions ADLs/Self Care Home Management;Biofeedback;Cryotherapy;Moist Heat;DME Instruction;Gait training;Stair training;Functional mobility training;Therapeutic activities;Therapeutic exercise;Balance  training;Neuromuscular re-education;Patient/family education;Manual techniques;Dry needling;Energy conservation;Taping   PT Next Visit Plan review HEP and goals; caution with extension due to 35m anterior listhesis at L5-S1.lumbar/hip flexibility but reduce range of exercise if radiating symptoms occur, functional strength, postural and core training.    PT Home Exercise Plan Eval: SKTC, lumbar rotations, 3D hip excursions    Consulted and Agree with Plan of Care Patient      Patient will benefit from skilled therapeutic intervention in order  to improve the following deficits and impairments:  Improper body mechanics, Pain, Increased muscle spasms, Postural dysfunction, Decreased activity tolerance, Decreased strength, Hypomobility, Difficulty walking, Impaired flexibility  Visit Diagnosis: Chronic midline low back pain without sciatica - Plan: PT plan of care cert/re-cert  Abnormal posture - Plan: PT plan of care cert/re-cert  Muscle weakness (generalized) - Plan: PT plan of care cert/re-cert  Other symptoms and signs involving the musculoskeletal system - Plan: PT plan of care cert/re-cert      G-Codes - 061/44/311637    Functional Assessment Tool Used Based on skilled clinical assessment of posture, pain patterns, joint mobility, functional task tolerance    Functional Limitation Mobility: Walking and moving around   Mobility: Walking and Moving Around Current Status ((V4008 At least 40 percent but less than 60 percent impaired, limited or restricted   Mobility: Walking and Moving Around Goal Status ((Q7619 At least 20 percent but less than 40 percent impaired, limited or restricted       Problem List There are no active problems to display for this patient.   KDeniece ReePT, DPT [phone] [place] [address]Phone: [phone]  Fax:  [phone] Name: Tamara SchwartzmanMRN: [id number] of Birth: [birth year]

## 2016-05-24 NOTE — Patient Instructions (Signed)
     SINGLE KNEE TO CHEST STRETCH - SKTC  While lying on your back, use your hands and gently draw up a knee towards your chest.   Keep your other knee straight and lying on the ground.  Hold for 10 seconds, then relax.  Repeat 5 times each side, twice a day.     Lumbar Rotations   Lying on your back with your knees bent, slowly drop your legs to one side and hold the stretch for 10 seconds. Come back to the middle and switch sides. You should feel the stretch in your back on the opposite side that your legs are leaning.   Repeat 5 times each side, twice a day.

## 2016-05-29 ENCOUNTER — Ambulatory Visit (HOSPITAL_COMMUNITY): Payer: Medicare Other | Admitting: Physical Therapy

## 2016-05-29 DIAGNOSIS — G8929 Other chronic pain: Secondary | ICD-10-CM

## 2016-05-29 DIAGNOSIS — M6281 Muscle weakness (generalized): Secondary | ICD-10-CM | POA: Diagnosis not present

## 2016-05-29 DIAGNOSIS — R29898 Other symptoms and signs involving the musculoskeletal system: Secondary | ICD-10-CM | POA: Diagnosis not present

## 2016-05-29 DIAGNOSIS — R293 Abnormal posture: Secondary | ICD-10-CM

## 2016-05-29 DIAGNOSIS — M545 Low back pain: Secondary | ICD-10-CM | POA: Diagnosis not present

## 2016-05-29 NOTE — Patient Instructions (Signed)
Straight Leg Raise    Tighten stomach and slowly raise locked right leg __10__ inches from floor.Repeat _10___ times per set. Do __2__ sets per day  Bridge    Lie back, legs bent. Inhale, pressing hips up. Repeat _10___ times. Do _2___ sessions per day.   Abduction: Side Leg Lift (Eccentric) - Side-Lying    Lie on side. Lift top leg slightly higher than shoulder level. Keep top leg straight with body, toes pointing forward. Slowly lower for 3-5 seconds. _10__ reps per set, _2__ sets per day  Piriformis Stretch, Sitting    Sit, one ankle on opposite knee, same-side hand on crossed knee. Push down on knee, keeping spine straight. Lean torso forward, with flat back, until tension is felt in hamstrings and gluteals of crossed-leg side. Hold _20__ seconds.  Repeat _3__ times per session. Do _2__ sessions per day.  Hamstring Stretch    With other leg bent, foot flat, grasp right leg and slowly try to straighten knee. Hold __20__ seconds.Repeat __3__ times. Do _2___ sessions per day.

## 2016-05-29 NOTE — Therapy (Signed)
[place], [place], [address] Phone: [phone]   Fax:  [phone]  Physical Therapy Treatment  Patient Details  Name: Tamara Thompson MRN: [id number] Date of Birth: 11/04/1950 Referring Provider: Melina Schools   Encounter Date: 05/29/2016      PT End of Session - 05/29/16 1436    Visit Number 3   Number of Visits 13   Date for PT Re-Evaluation 06/14/16   Authorization Type Medicare part A and B    Authorization Time Period 05/24/16 to 07/05/16   Authorization - Visit Number 3   Authorization - Number of Visits 10   PT Start Time 1350   PT Stop Time 1418   PT Time Calculation (min) 28 min   Activity Tolerance Patient tolerated treatment well   Behavior During Therapy [facility] for tasks assessed/performed      Past Medical History:  Diagnosis Date  . Anemia   . Bleeding from the nose   . Deviated septum   . Kidney stones   . Medical history non-contributory     Past Surgical History:  Procedure Laterality Date  . ANKLE SURGERY  2010   left  . CESAREAN SECTION    . COLONOSCOPY N/A 12/14/2013   Procedure: COLONOSCOPY;  Surgeon: Danie Binder, MD;  [facility];  Service: Endoscopy;  Laterality: N/A;  10:30 AM  . NASAL ENDOSCOPY WITH EPISTAXIS CONTROL Bilateral 02/07/2015   Procedure: BILATERAL NASAL ENDOSCOPIC CAUTERIZATION;  Surgeon: Leta Baptist, MD;  Location: [place];  Service: ENT;  Laterality: Bilateral;  . NASAL SEPTOPLASTY W/ TURBINOPLASTY Bilateral 02/02/2014   Procedure: RIGHT NASAL SEPTOPLASTY WITH TURBINATE REDUCTION;  Surgeon: Ascencion Dike, MD;  Location: [place];  Service: ENT;  Laterality: Bilateral;  . OVARIAN CYST SURGERY    . POLYPECTOMY Right 02/02/2014   Procedure: RIGHT POLYPECTOMY NASAL;  Surgeon: Ascencion Dike, MD;  Location: [place];  Service: ENT;  Laterality: Right;  . Surgery of jaw and cheek fracture      There were no  vitals filed for this visit.      Subjective Assessment - 05/29/16 1401    Subjective Pt states she is not hurting today but is getting a migrane.  States she may not be able to complete today's session because of this.  States she has been completing her HEP 2X daily.     Currently in Pain? No/denies                         [facility] Adult PT Treatment/Exercise - 05/29/16 0001      Lumbar Exercises: Stretches   Active Hamstring Stretch 2 reps;20 seconds   Active Hamstring Stretch Limitations with rope in supine   Single Knee to Chest Stretch 5 reps   Lower Trunk Rotation 5 reps   Piriformis Stretch 2 reps;30 seconds   Piriformis Stretch Limitations seated     Lumbar Exercises: Supine   Ab Set 10 reps   AB Set Limitations 3"   Bridge 10 reps   Straight Leg Raise 5 reps     Lumbar Exercises: Sidelying   Hip Abduction 10 reps                PT Education - 05/29/16 1428    Education provided Yes   Education Details reviewed goals per initial evaluation, reviewed HEP and added additional therex.  Educated on proper logroll  technique with patient able to demonstrate correctly.   Person(s) Educated Patient   Methods Explanation;Demonstration;Tactile cues;Verbal cues;Handout   Comprehension Verbalized understanding;Returned demonstration;Verbal cues required;Tactile cues required;Need further instruction          PT Short Term Goals - 05/24/16 1632      PT SHORT TERM GOAL #1   Title Patient to be able to maintain correct posture at least 70% of the time in order to improve mechanics and reduce pain    Time 3   Period Weeks   Status New     PT SHORT TERM GOAL #2   Title Patient to experience pain no more than 4/10 at worst in order to improve QOL and functional task performance skills    Time 3   Period Weeks   Status New     PT SHORT TERM GOAL #3   Title Patient to be able to perform all functional mobility, including sit to supine/supine to sit,  with correct mechanics in order to protect lumbar spine and prevent pain exacerbation    Time 3   Period Weeks   Status New     PT SHORT TERM GOAL #4   Title Patient to correctly and consistently perform targeted HEP, to be updated as appropriate    Time 1   Period Weeks   Status New           PT Long Term Goals - 05/24/16 1633      PT LONG TERM GOAL #1   Title Patient to demonstrate functional strength as being 5/5 in all tested muscle groups in order to improve mechanics and tolerance to extended activities    Time 6   Period Weeks   Status New     PT LONG TERM GOAL #2   Title Patient to experience pain no more than 2/10 at worst in order to improve QOL and tolerance to extended functional tasks    Time 6   Period Weeks   Status New     PT LONG TERM GOAL #3   Title Patient to demonstrate correct functional lifting mechanics (floor to waist height and turning with load), as well as good mechanics for tasks she performs around her home in order to prevent re-exacerbation of pain    Time 6   Period Weeks   Status New     PT LONG TERM GOAL #4   Title Patient to demonstrate improvement in mobilty of spine as evidenced by only 40% limitation in lumbar and sacral PA end feels in order to assist in pain reduction and improved overall mechancis    Time 6   Period Weeks   Status New               Plan - 05/29/16 1439    Clinical Impression Statement Reviewed HEP and initial evaluation goals today with patient.  Pt also educated on logroll technique to mobilize supine to/from sit to improve safety and independence.  Pt requested more exercises to complete at home so added additional exercises to HEP today.     Rehab Potential Good   Clinical Impairments Affecting Rehab Potential (+) motivated to participate in PLOF, relatively high PLOF; (-) sedentary lifestyle, chronicity of pain    PT Frequency 2x / week  if she is doing well at re-assess, we may drop to 1x/week. TBD.     PT Duration 6 weeks   PT Treatment/Interventions ADLs/Self Care Home Management;Biofeedback;Cryotherapy;Moist Heat;DME Instruction;Gait training;Stair training;Functional mobility training;Therapeutic activities;Therapeutic exercise;Balance  training;Neuromuscular re-education;Patient/family education;Manual techniques;Dry needling;Energy conservation;Taping   PT Next Visit Plan Continue to progress lumbar/hip flexibility but reduce range of exercise if radiating symptoms occur, functional strength, postural and core training    PT Home Exercise Plan Eval: SKTC, lumbar rotations, 3D hip excursions 05/29/16: hamstring stretch, bridge, piriformis stretch, sidelying hip abduction   Consulted and Agree with Plan of Care Patient      Patient will benefit from skilled therapeutic intervention in order to improve the following deficits and impairments:  Improper body mechanics, Pain, Increased muscle spasms, Postural dysfunction, Decreased activity tolerance, Decreased strength, Hypomobility, Difficulty walking, Impaired flexibility  Visit Diagnosis: Chronic midline low back pain without sciatica  Abnormal posture  Muscle weakness (generalized)  Other symptoms and signs involving the musculoskeletal system     Problem List There are no active problems to display for this patient.   Teena Irani, PTA/CLT [phone]  05/29/2016, 2:44 PM  [facility] [address] [place], [place], [address] Phone: [phone]   Fax:  [phone]  Name: Tamara Thompson MRN: [id number] Date of Birth: 1951/03/03

## 2016-06-01 ENCOUNTER — Ambulatory Visit (HOSPITAL_COMMUNITY): Payer: Medicare Other

## 2016-06-01 DIAGNOSIS — M6281 Muscle weakness (generalized): Secondary | ICD-10-CM

## 2016-06-01 DIAGNOSIS — R29898 Other symptoms and signs involving the musculoskeletal system: Secondary | ICD-10-CM

## 2016-06-01 DIAGNOSIS — R293 Abnormal posture: Secondary | ICD-10-CM

## 2016-06-01 DIAGNOSIS — G8929 Other chronic pain: Secondary | ICD-10-CM

## 2016-06-01 DIAGNOSIS — M545 Low back pain: Secondary | ICD-10-CM | POA: Diagnosis not present

## 2016-06-01 NOTE — Therapy (Signed)
[place], [place], [address] Phone: [phone]   Fax:  [phone]  Physical Therapy Treatment  Patient Details  Name: Tamara Thompson MRN: [id number] Date of Birth: [birth year] Referring Provider: Melina Schools   Encounter Date: 06/01/2016      PT End of Session - 06/01/16 1450    Visit Number 4   Number of Visits 13   Date for PT Re-Evaluation 06/14/16   Authorization Type Medicare part A and B    Authorization Time Period 05/24/16 to 07/05/16   Authorization - Visit Number 4   Authorization - Number of Visits 10   PT Start Time 1350   PT Stop Time 1420   PT Time Calculation (min) 30 min   Activity Tolerance Patient limited by pain;Other (comment)  decreased tolerance with supine positin   Behavior During Therapy Anxious  cueing for increased holding time with stretches      Past Medical History:  Diagnosis Date  . Anemia   . Bleeding from the nose   . Deviated septum   . Kidney stones   . Medical history non-contributory     Past Surgical History:  Procedure Laterality Date  . ANKLE SURGERY  2010   left  . CESAREAN SECTION    . COLONOSCOPY N/A 12/14/2013   Procedure: COLONOSCOPY;  Surgeon: Danie Binder, MD;  [facility];  Service: Endoscopy;  Laterality: N/A;  10:30 AM  . NASAL ENDOSCOPY WITH EPISTAXIS CONTROL Bilateral 02/07/2015   Procedure: BILATERAL NASAL ENDOSCOPIC CAUTERIZATION;  Surgeon: Leta Baptist, MD;  Location: [place];  Service: ENT;  Laterality: Bilateral;  . NASAL SEPTOPLASTY W/ TURBINOPLASTY Bilateral 02/02/2014   Procedure: RIGHT NASAL SEPTOPLASTY WITH TURBINATE REDUCTION;  Surgeon: Ascencion Dike, MD;  Location: [place];  Service: ENT;  Laterality: Bilateral;  . OVARIAN CYST SURGERY    . POLYPECTOMY Right 02/02/2014   Procedure: RIGHT POLYPECTOMY NASAL;  Surgeon: Ascencion Dike, MD;  Location: [place];  Service: ENT;   Laterality: Right;  . Surgery of jaw and cheek fracture      There were no vitals filed for this visit.      Subjective Assessment - 06/01/16 1345    Subjective Pt stated she has increased lower back pain following opening window yesterday, feels she pulled something on lower back.  Pain scale 8/10 center of lower back sharp pain   Pertinent History history of L ankle fracture repair surgery 2010, no other significant PMH/PSH    Patient Stated Goals alleviate pain, get HEP    Currently in Pain? Yes   Pain Score 8    Pain Location Back   Pain Orientation Lower;Medial   Pain Descriptors / Indicators Sharp   Pain Type Chronic pain   Pain Radiating Towards no radicular symptoms   Pain Onset More than a month ago   Pain Frequency Constant   Aggravating Factors  bending over, vaccuming, sweeping. making bed   Pain Relieving Factors walking around in the morning, sitting with good posture   Effect of Pain on Daily Activities she can do everything she needs if she can go at her own pace              [facility] Adult PT Treatment/Exercise - 06/01/16 0001      Lumbar Exercises: Stretches   Active Hamstring Stretch 2 reps;20 seconds   Active Hamstring Stretch Limitations seated with LE on stool  Single Knee to Chest Stretch 2 reps;30 seconds   Single Knee to Chest Stretch Limitations complete sidelying due to no tolerance with supine position today   Lower Trunk Rotation 5 reps   Lower Trunk Rotation Limitations seated   Piriformis Stretch 2 reps;30 seconds   Piriformis Stretch Limitations seated     Lumbar Exercises: Seated   LAQ on Chair Limitations Ab sets 10x 5"   Hip Flexion on Ball 5 reps;Both   Hip Flexion on Ball Limitations seated forward lumbar flexion with UE on ball     Lumbar Exercises: Sidelying   Clam 5 reps                  PT Short Term Goals - 05/24/16 1632      PT SHORT TERM GOAL #1   Title Patient to be able to maintain correct posture at least  70% of the time in order to improve mechanics and reduce pain    Time 3   Period Weeks   Status New     PT SHORT TERM GOAL #2   Title Patient to experience pain no more than 4/10 at worst in order to improve QOL and functional task performance skills    Time 3   Period Weeks   Status New     PT SHORT TERM GOAL #3   Title Patient to be able to perform all functional mobility, including sit to supine/supine to sit, with correct mechanics in order to protect lumbar spine and prevent pain exacerbation    Time 3   Period Weeks   Status New     PT SHORT TERM GOAL #4   Title Patient to correctly and consistently perform targeted HEP, to be updated as appropriate    Time 1   Period Weeks   Status New           PT Long Term Goals - 05/24/16 1633      PT LONG TERM GOAL #1   Title Patient to demonstrate functional strength as being 5/5 in all tested muscle groups in order to improve mechanics and tolerance to extended activities    Time 6   Period Weeks   Status New     PT LONG TERM GOAL #2   Title Patient to experience pain no more than 2/10 at worst in order to improve QOL and tolerance to extended functional tasks    Time 6   Period Weeks   Status New     PT LONG TERM GOAL #3   Title Patient to demonstrate correct functional lifting mechanics (floor to waist height and turning with load), as well as good mechanics for tasks she performs around her home in order to prevent re-exacerbation of pain    Time 6   Period Weeks   Status New     PT LONG TERM GOAL #4   Title Patient to demonstrate improvement in mobilty of spine as evidenced by only 40% limitation in lumbar and sacral PA end feels in order to assist in pain reduction and improved overall mechancis    Time 6   Period Weeks   Status New               Plan - 06/01/16 1451    Clinical Impression Statement Pt limited by pain stated strain on lower back following raising window at home yesterday, decreased  tolerance for supine position.  Sessin foucs on lumbar mobility to assist with pain control and core  strengthening.  Modified stretches to sitting position to improve tolerance with therex.  EOS pt reports pain reduced to 6/10.   Rehab Potential Good   Clinical Impairments Affecting Rehab Potential (+) motivated to participate in PLOF, relatively high PLOF; (-) sedentary lifestyle, chronicity of pain    PT Frequency 2x / week  if doing well as reassess, reduce frequency to 1x/week TBD   PT Duration 6 weeks   PT Treatment/Interventions ADLs/Self Care Home Management;Biofeedback;Cryotherapy;Moist Heat;DME Instruction;Gait training;Stair training;Functional mobility training;Therapeutic activities;Therapeutic exercise;Balance training;Neuromuscular re-education;Patient/family education;Manual techniques;Dry needling;Energy conservation;Taping   PT Next Visit Plan Continue to progress lumbar/hip flexibility but reduce range of exercise if radiating symptoms occur, functional strength, postural and core training    PT Home Exercise Plan Eval: SKTC, lumbar rotations, 3D hip excursions 05/29/16: hamstring stretch, bridge, piriformis stretch, sidelying hip abduction      Patient will benefit from skilled therapeutic intervention in order to improve the following deficits and impairments:  Improper body mechanics, Pain, Increased muscle spasms, Postural dysfunction, Decreased activity tolerance, Decreased strength, Hypomobility, Difficulty walking, Impaired flexibility  Visit Diagnosis: Chronic midline low back pain without sciatica  Abnormal posture  Muscle weakness (generalized)  Other symptoms and signs involving the musculoskeletal system     Problem List There are no active problems to display for this patient.  [address] LPTA; [place]  Aldona Lento 06/01/2016, 3:03 PM  [place] [address] [place], [place],  [address] Phone: [phone]   Fax:  [phone]  Name: Tamara Thompson MRN: [id number] Date of Birth: 1950/04/27

## 2016-06-04 ENCOUNTER — Telehealth (HOSPITAL_COMMUNITY): Payer: Self-pay | Admitting: Internal Medicine

## 2016-06-04 NOTE — Telephone Encounter (Signed)
06/04/16 pt called to cx because she said that she threw her back out and can barely move around

## 2016-06-05 ENCOUNTER — Ambulatory Visit (HOSPITAL_COMMUNITY): Payer: Medicare Other | Admitting: Physical Therapy

## 2016-06-06 ENCOUNTER — Telehealth (HOSPITAL_COMMUNITY): Payer: Self-pay | Admitting: Physical Therapy

## 2016-06-06 NOTE — Telephone Encounter (Signed)
Pt got a steroid shot and wants to cx the next three apptments, she will return on 06/18/16

## 2016-06-07 ENCOUNTER — Ambulatory Visit (HOSPITAL_COMMUNITY): Payer: Medicare Other | Admitting: Physical Therapy

## 2016-06-11 ENCOUNTER — Ambulatory Visit (HOSPITAL_COMMUNITY): Payer: Medicare Other | Admitting: Physical Therapy

## 2016-06-14 ENCOUNTER — Ambulatory Visit (HOSPITAL_COMMUNITY): Payer: Medicare Other | Admitting: Physical Therapy

## 2016-06-18 ENCOUNTER — Telehealth (HOSPITAL_COMMUNITY): Payer: Self-pay | Admitting: Internal Medicine

## 2016-06-18 ENCOUNTER — Ambulatory Visit (HOSPITAL_COMMUNITY): Payer: Medicare Other | Admitting: Physical Therapy

## 2016-06-18 NOTE — Telephone Encounter (Signed)
06/18/16  Pt cx for this week.... She said that she was going back to the dr... She has been taking steroids

## 2016-06-21 ENCOUNTER — Encounter (HOSPITAL_COMMUNITY): Payer: Medicare Other

## 2016-06-22 ENCOUNTER — Telehealth (HOSPITAL_COMMUNITY): Payer: Self-pay | Admitting: Physical Therapy

## 2016-06-22 DIAGNOSIS — G8929 Other chronic pain: Secondary | ICD-10-CM | POA: Diagnosis not present

## 2016-06-22 DIAGNOSIS — M545 Low back pain: Secondary | ICD-10-CM | POA: Diagnosis not present

## 2016-06-22 NOTE — Telephone Encounter (Signed)
She has gotten and shot and on pain meds and wants to cx these two apptments, she will let us know about the next week. NF 06/22/16

## 2016-06-25 ENCOUNTER — Ambulatory Visit (HOSPITAL_COMMUNITY): Payer: Medicare Other | Admitting: Physical Therapy

## 2016-06-26 DIAGNOSIS — G8929 Other chronic pain: Secondary | ICD-10-CM | POA: Diagnosis not present

## 2016-06-26 DIAGNOSIS — M545 Low back pain: Secondary | ICD-10-CM | POA: Diagnosis not present

## 2016-06-28 ENCOUNTER — Encounter (HOSPITAL_COMMUNITY): Payer: Medicare Other | Admitting: Physical Therapy

## 2016-07-02 ENCOUNTER — Ambulatory Visit (HOSPITAL_COMMUNITY): Payer: Medicare Other | Admitting: Physical Therapy

## 2016-07-02 ENCOUNTER — Telehealth (HOSPITAL_COMMUNITY): Payer: Self-pay | Admitting: Internal Medicine

## 2016-07-02 NOTE — Telephone Encounter (Signed)
07/02/16 I called about the weather and she said that she had a pain blocker and needed to cx this week.  She goes back to the dr and will call us back if she needs to return.

## 2016-07-05 ENCOUNTER — Ambulatory Visit (HOSPITAL_COMMUNITY): Payer: Medicare Other | Admitting: Physical Therapy

## 2016-07-10 DIAGNOSIS — M545 Low back pain: Secondary | ICD-10-CM | POA: Diagnosis not present

## 2016-07-10 DIAGNOSIS — G8929 Other chronic pain: Secondary | ICD-10-CM | POA: Diagnosis not present

## 2016-08-22 DIAGNOSIS — M5136 Other intervertebral disc degeneration, lumbar region: Secondary | ICD-10-CM | POA: Diagnosis not present

## 2016-10-11 ENCOUNTER — Encounter (HOSPITAL_COMMUNITY): Payer: Self-pay | Admitting: Physical Therapy

## 2016-10-11 NOTE — Therapy (Signed)
[place], [place], [address] Phone: [phone]   Fax:  [phone]  Patient Details  Name: Tamara Thompson MRN: [id number] Date of Birth: 1950/06/26 Referring Provider:  No ref. provider found  Encounter Date: 10/11/2016   PHYSICAL THERAPY DISCHARGE SUMMARY  Visits from Start of Care: 3  Current functional level related to goals / functional outcomes: Patient has not returned to skilled PT services since last scheduled session   Remaining deficits: Unable to assess    Education / Equipment: N/A  Plan: Patient agrees to discharge.  Patient goals were not met. Patient is being discharged due to not returning since the last visit.  ?????      Deniece Ree PT, DPT [facility] [address] [place], [place], [address] Phone: [phone]   Fax:  [phone]

## 2016-10-25 ENCOUNTER — Encounter (HOSPITAL_COMMUNITY): Payer: Self-pay | Admitting: Emergency Medicine

## 2016-10-25 ENCOUNTER — Inpatient Hospital Stay (HOSPITAL_COMMUNITY)
Admission: EM | Admit: 2016-10-25 | Discharge: 2016-10-27 | DRG: 066 | Disposition: A | Discharge status: Home / Self Care | Payer: Medicare Other | Attending: Neurology | Admitting: Neurology

## 2016-10-25 ENCOUNTER — Emergency Department (HOSPITAL_COMMUNITY): Payer: Medicare Other

## 2016-10-25 ENCOUNTER — Inpatient Hospital Stay (HOSPITAL_COMMUNITY): Payer: Medicare Other

## 2016-10-25 ENCOUNTER — Inpatient Hospital Stay: Admission: AD | Admit: 2016-10-25 | Payer: Self-pay | Source: Other Acute Inpatient Hospital | Admitting: Neurology

## 2016-10-25 DIAGNOSIS — Z880 Allergy status to penicillin: Secondary | ICD-10-CM

## 2016-10-25 DIAGNOSIS — Z87442 Personal history of urinary calculi: Secondary | ICD-10-CM | POA: Diagnosis not present

## 2016-10-25 DIAGNOSIS — E785 Hyperlipidemia, unspecified: Secondary | ICD-10-CM | POA: Diagnosis present

## 2016-10-25 DIAGNOSIS — Z9889 Other specified postprocedural states: Secondary | ICD-10-CM

## 2016-10-25 DIAGNOSIS — F17200 Nicotine dependence, unspecified, uncomplicated: Secondary | ICD-10-CM | POA: Diagnosis present

## 2016-10-25 DIAGNOSIS — I360 Nonrheumatic tricuspid (valve) stenosis: Secondary | ICD-10-CM | POA: Diagnosis not present

## 2016-10-25 DIAGNOSIS — Z8249 Family history of ischemic heart disease and other diseases of the circulatory system: Secondary | ICD-10-CM

## 2016-10-25 DIAGNOSIS — Z6832 Body mass index (BMI) 32.0-32.9, adult: Secondary | ICD-10-CM | POA: Diagnosis not present

## 2016-10-25 DIAGNOSIS — J342 Deviated nasal septum: Secondary | ICD-10-CM | POA: Diagnosis present

## 2016-10-25 DIAGNOSIS — I671 Cerebral aneurysm, nonruptured: Secondary | ICD-10-CM

## 2016-10-25 DIAGNOSIS — Z823 Family history of stroke: Secondary | ICD-10-CM | POA: Diagnosis not present

## 2016-10-25 DIAGNOSIS — R29818 Other symptoms and signs involving the nervous system: Secondary | ICD-10-CM | POA: Diagnosis not present

## 2016-10-25 DIAGNOSIS — E669 Obesity, unspecified: Secondary | ICD-10-CM | POA: Diagnosis present

## 2016-10-25 DIAGNOSIS — Z885 Allergy status to narcotic agent status: Secondary | ICD-10-CM | POA: Diagnosis not present

## 2016-10-25 DIAGNOSIS — Z23 Encounter for immunization: Secondary | ICD-10-CM | POA: Diagnosis not present

## 2016-10-25 DIAGNOSIS — R739 Hyperglycemia, unspecified: Secondary | ICD-10-CM | POA: Diagnosis not present

## 2016-10-25 DIAGNOSIS — R4182 Altered mental status, unspecified: Secondary | ICD-10-CM

## 2016-10-25 DIAGNOSIS — Z79899 Other long term (current) drug therapy: Secondary | ICD-10-CM

## 2016-10-25 DIAGNOSIS — I619 Nontraumatic intracerebral hemorrhage, unspecified: Secondary | ICD-10-CM

## 2016-10-25 DIAGNOSIS — I610 Nontraumatic intracerebral hemorrhage in hemisphere, subcortical: Secondary | ICD-10-CM | POA: Diagnosis not present

## 2016-10-25 DIAGNOSIS — E782 Mixed hyperlipidemia: Secondary | ICD-10-CM

## 2016-10-25 DIAGNOSIS — Z801 Family history of malignant neoplasm of trachea, bronchus and lung: Secondary | ICD-10-CM

## 2016-10-25 DIAGNOSIS — Z87891 Personal history of nicotine dependence: Secondary | ICD-10-CM

## 2016-10-25 DIAGNOSIS — D649 Anemia, unspecified: Secondary | ICD-10-CM | POA: Diagnosis present

## 2016-10-25 DIAGNOSIS — R4781 Slurred speech: Secondary | ICD-10-CM | POA: Diagnosis present

## 2016-10-25 DIAGNOSIS — R29709 NIHSS score 9: Secondary | ICD-10-CM | POA: Diagnosis present

## 2016-10-25 LAB — DIFFERENTIAL
Basophils Absolute: 0.1 10*3/uL (ref 0.0–0.1)
Basophils Relative: 1 %
EOS PCT: 6 %
Eosinophils Absolute: 0.4 10*3/uL (ref 0.0–0.7)
LYMPHS ABS: 2.1 10*3/uL (ref 0.7–4.0)
LYMPHS PCT: 33 %
MONO ABS: 0.7 10*3/uL (ref 0.1–1.0)
MONOS PCT: 11 %
NEUTROS ABS: 3 10*3/uL (ref 1.7–7.7)
Neutrophils Relative %: 49 %

## 2016-10-25 LAB — COMPREHENSIVE METABOLIC PANEL
ALK PHOS: 66 U/L (ref 38–126)
ALT: 134 U/L — AB (ref 14–54)
ANION GAP: 11 (ref 5–15)
AST: 63 U/L — ABNORMAL HIGH (ref 15–41)
Albumin: 4.1 g/dL (ref 3.5–5.0)
BILIRUBIN TOTAL: 0.4 mg/dL (ref 0.3–1.2)
BUN: 15 mg/dL (ref 6–20)
CALCIUM: 9.2 mg/dL (ref 8.9–10.3)
CO2: 20 mmol/L — AB (ref 22–32)
CREATININE: 0.72 mg/dL (ref 0.44–1.00)
Chloride: 103 mmol/L (ref 101–111)
Glucose, Bld: 186 mg/dL — ABNORMAL HIGH (ref 65–99)
Potassium: 3.7 mmol/L (ref 3.5–5.1)
Sodium: 134 mmol/L — ABNORMAL LOW (ref 135–145)
TOTAL PROTEIN: 7.3 g/dL (ref 6.5–8.1)

## 2016-10-25 LAB — APTT: aPTT: 26 seconds (ref 24–36)

## 2016-10-25 LAB — I-STAT TROPONIN, ED: TROPONIN I, POC: 0 ng/mL (ref 0.00–0.08)

## 2016-10-25 LAB — I-STAT CHEM 8, ED
BUN: 15 mg/dL (ref 6–20)
CALCIUM ION: 1.17 mmol/L (ref 1.15–1.40)
CHLORIDE: 103 mmol/L (ref 101–111)
Creatinine, Ser: 0.7 mg/dL (ref 0.44–1.00)
GLUCOSE: 187 mg/dL — AB (ref 65–99)
HCT: 39 % (ref 36.0–46.0)
Hemoglobin: 13.3 g/dL (ref 12.0–15.0)
Potassium: 3.8 mmol/L (ref 3.5–5.1)
SODIUM: 139 mmol/L (ref 135–145)
TCO2: 24 mmol/L (ref 0–100)

## 2016-10-25 LAB — CBC
HEMATOCRIT: 37.5 % (ref 36.0–46.0)
Hemoglobin: 12.6 g/dL (ref 12.0–15.0)
MCH: 30.5 pg (ref 26.0–34.0)
MCHC: 33.6 g/dL (ref 30.0–36.0)
MCV: 90.8 fL (ref 78.0–100.0)
PLATELETS: 259 10*3/uL (ref 150–400)
RBC: 4.13 MIL/uL (ref 3.87–5.11)
RDW: 13.6 % (ref 11.5–15.5)
WBC: 6.3 10*3/uL (ref 4.0–10.5)

## 2016-10-25 LAB — LIPID PANEL
Cholesterol: 248 mg/dL — ABNORMAL HIGH (ref 0–200)
HDL: 47 mg/dL (ref >40)
LDL CALC: 159 mg/dL — AB (ref 0–99)
Total CHOL/HDL Ratio: 5.3 RATIO
Triglycerides: 208 mg/dL — ABNORMAL HIGH (ref <150)
VLDL: 42 mg/dL — AB (ref 0–40)

## 2016-10-25 LAB — ETHANOL

## 2016-10-25 LAB — PROTIME-INR
INR: 0.93
PROTHROMBIN TIME: 12.5 s (ref 11.4–15.2)

## 2016-10-25 MED ORDER — ONDANSETRON HCL 4 MG/2ML IJ SOLN
4.0000 mg | Freq: Once | INTRAMUSCULAR | Status: AC
  Administered 2016-10-25: 4 mg via INTRAVENOUS
  Filled 2016-10-25: qty 2

## 2016-10-25 MED ORDER — ACETAMINOPHEN 160 MG/5ML PO SOLN: 650.0000 mg | ORAL | Status: DC | PRN

## 2016-10-25 MED ORDER — MORPHINE SULFATE (PF) 4 MG/ML IV SOLN
4.0000 mg | Freq: Once | INTRAVENOUS | Status: AC
  Administered 2016-10-25: 4 mg via INTRAVENOUS
  Filled 2016-10-25: qty 1

## 2016-10-25 MED ORDER — SENNOSIDES-DOCUSATE SODIUM 8.6-50 MG PO TABS
1.0000 | ORAL_TABLET | Freq: Two times a day (BID) | ORAL | Status: DC
  Administered 2016-10-27: 1 via ORAL
  Filled 2016-10-25 (×3): qty 1

## 2016-10-25 MED ORDER — SODIUM CHLORIDE 0.9 % IV BOLUS (SEPSIS)
500.0000 mL | Freq: Once | INTRAVENOUS | Status: AC
  Administered 2016-10-25: 500 mL via INTRAVENOUS

## 2016-10-25 MED ORDER — STROKE: EARLY STAGES OF RECOVERY BOOK
Freq: Once | Status: AC
  Administered 2016-10-26: 1
  Filled 2016-10-25 (×2): qty 1

## 2016-10-25 MED ORDER — ACETAMINOPHEN 650 MG RE SUPP
650.0000 mg | RECTAL | Status: DC | PRN
  Administered 2016-10-26: 650 mg via RECTAL
  Filled 2016-10-25: qty 1

## 2016-10-25 MED ORDER — ACETAMINOPHEN 325 MG PO TABS
650.0000 mg | ORAL_TABLET | ORAL | Status: DC | PRN
  Administered 2016-10-26 – 2016-10-27 (×6): 650 mg via ORAL
  Filled 2016-10-25 (×7): qty 2

## 2016-10-25 MED ORDER — PANTOPRAZOLE SODIUM 40 MG IV SOLR
40.0000 mg | Freq: Every day | INTRAVENOUS | Status: DC
  Administered 2016-10-25: 40 mg via INTRAVENOUS
  Filled 2016-10-25: qty 40

## 2016-10-25 MED ORDER — SODIUM CHLORIDE 0.9 % IV SOLN
100.0000 mL/h | INTRAVENOUS | Status: DC
  Administered 2016-10-25 – 2016-10-26 (×2): 100 mL/h via INTRAVENOUS

## 2016-10-25 MED ORDER — CLEVIDIPINE BUTYRATE 0.5 MG/ML IV EMUL
0.0000 mg/h | INTRAVENOUS | Status: DC
  Filled 2016-10-25: qty 50

## 2016-10-25 MED ORDER — LABETALOL HCL 5 MG/ML IV SOLN: 20.0000 mg | Freq: Once | INTRAVENOUS | Status: DC

## 2016-10-25 NOTE — H&P (Signed)
Requesting Physician: Dr. Tomi Bamberger    Chief Complaint: R weakness and slurred speech   History obtained from:   Patient and Chart     HPI:                                                                                                                                         Tamara Thompson is an 66 y.o. female with PMH significant for anemia, was in her usual state of health up until 630 pm today when she has sudden onsent of slurred speech and R weakness, presented to [facility] ED where stroke code was activated, CTH was ordered and showed 1.5X1.5 CM acute L BG ICH with surrounding edema, pt was not taken any blood thinner, BR was normal, she was transferred to [facility] Neuro ICU for further evaluation.  Pt denied any trauma, no chest pain, no N/V or SOB.  Date last known well: 10/25/16 Time last known well: 630 pm tPA Given: NO due to [place] Intracerebral Hemorrhage (ICH) Score 0   Past Medical History:  Diagnosis Date  . Anemia   . Bleeding from the nose   . Deviated septum   . Kidney stones   . Medical history non-contributory     Past Surgical History:  Procedure Laterality Date  . ANKLE SURGERY  2010   left  . CESAREAN SECTION    . COLONOSCOPY N/A 12/14/2013   Procedure: COLONOSCOPY;  Surgeon: Danie Binder, MD;  [facility];  Service: Endoscopy;  Laterality: N/A;  10:30 AM  . NASAL ENDOSCOPY WITH EPISTAXIS CONTROL Bilateral 02/07/2015   Procedure: BILATERAL NASAL ENDOSCOPIC CAUTERIZATION;  Surgeon: Leta Baptist, MD;  Location: [place];  Service: ENT;  Laterality: Bilateral;  . NASAL SEPTOPLASTY W/ TURBINOPLASTY Bilateral 02/02/2014   Procedure: RIGHT NASAL SEPTOPLASTY WITH TURBINATE REDUCTION;  Surgeon: Ascencion Dike, MD;  Location: [place];  Service: ENT;  Laterality: Bilateral;  . OVARIAN CYST SURGERY    . POLYPECTOMY Right 02/02/2014   Procedure: RIGHT POLYPECTOMY NASAL;  Surgeon: Ascencion Dike, MD;  Location: [place];  Service: ENT;   Laterality: Right;  . Surgery of jaw and cheek fracture      Family History  Problem Relation Age of Onset  . Heart disease Mother   . Cancer Father        lung  . Stroke Father   . Colon cancer Neg Hx    Social History:  reports that she quit smoking about 6 years ago. Her smoking use included Cigarettes. She has a 62.50 pack-year smoking history. She has never used smokeless tobacco. She reports that she drinks alcohol. She reports that she does not use drugs.  Allergies:  Allergies  Allergen Reactions  . Penicillins     Unknown Reaction  . Percocet [Oxycodone-Acetaminophen] Rash    Medications:  I have reviewed the patient's current medications.  ROS:                                                                                                                                       History obtained from patient   General ROS: negative for - chills, fatigue, fever, night sweats, weight gain or weight loss Psychological ROS: negative for - behavioral disorder, hallucinations, memory difficulties, mood swings or suicidal ideation Ophthalmic ROS: negative for - blurry vision, double vision, eye pain or loss of vision ENT ROS: negative for - epistaxis, nasal discharge, oral lesions, sore throat, tinnitus or vertigo Allergy and Immunology ROS: negative for - hives or itchy/watery eyes Hematological and Lymphatic ROS: negative for - bleeding problems, bruising or swollen lymph nodes Endocrine ROS: negative for - galactorrhea, hair pattern changes, polydipsia/polyuria or temperature intolerance Respiratory ROS: negative for - cough, hemoptysis, shortness of breath or wheezing Cardiovascular ROS: negative for - chest pain, dyspnea on exertion, edema or irregular heartbeat Gastrointestinal ROS: negative for - abdominal pain, diarrhea, hematemesis, nausea/vomiting or  stool incontinence Genito-Urinary ROS: negative for - dysuria, hematuria, incontinence or urinary frequency/urgency Musculoskeletal ROS: negative for - joint swelling or muscular weakness Neurological ROS: as noted in HPI Dermatological ROS: negative for rash and skin lesion changes  Neurologic Examination:                                                                                                      Blood pressure 137/88, pulse 73, resp. rate 17, height 5' 6"  (1.676 m), weight 81.6 kg (180 lb), SpO2 94 %.  HEENT-  Normocephalic, no lesions, without obvious abnormality.  Normal external eye and conjunctiva.  Normal TM's bilaterally.  Normal auditory canals and external ears. Normal external nose, mucus membranes and septum.  Normal pharynx. Cardiovascular- RRR, S1S2 Lungs- cta Abdomen- Soft NT ND Musculoskeletal-no joint tenderness, deformity or swelling Skin- warm and dry  Neurological Examination Mental Status: GCS 15 Alert, oriented, thought content appropriate.  Speech mild slurred   without evidence of aphasia.  Able to follow 3 step commands without difficulty. Cranial Nerves: II: Discs flat bilaterally; Visual fields grossly normal,  III,IV, VI: ptosis not present, extra-ocular motions intact bilaterally, pupils equal, round, reactive to light and accommodation V,VII: smile symmetric, facial light touch sensation normal bilaterally VIII: hearing normal bilaterally IX,X: uvula rises symmetrically XI: bilateral shoulder shrug XII: midline tongue extension Motor: Right : Upper extremity   4/5 With pronator drift  Left:     Upper extremity   5/5  R Lower extremity   3/5     L Lower extremity   5/5 Tone and bulk:normal tone throughout; no atrophy noted Sensory: Pinprick and light touch intact throughout, bilaterally Deep Tendon Reflexes: 2+ and symmetric throughout Plantars: Right: downgoing   Left: downgoing Cerebellar: normal finger-to-nose, normal rapid alternating  movements and normal heel-to-shin test Gait: normal gait and station       Lab Results: Basic Metabolic Panel:  Recent Labs Lab 10/25/16 1920 10/25/16 1925  NA 134* 139  K 3.7 3.8  CL 103 103  CO2 20*  --   GLUCOSE 186* 187*  BUN 15 15  CREATININE 0.72 0.70  CALCIUM 9.2  --     Liver Function Tests:  Recent Labs Lab 10/25/16 1920  AST 63*  ALT 134*  ALKPHOS 66  BILITOT 0.4  PROT 7.3  ALBUMIN 4.1   No results for input(s): LIPASE, AMYLASE in the last 168 hours. No results for input(s): AMMONIA in the last 168 hours.  CBC:  Recent Labs Lab 10/25/16 1920 10/25/16 1925  WBC 6.3  --   NEUTROABS 3.0  --   HGB 12.6 13.3  HCT 37.5 39.0  MCV 90.8  --   PLT 259  --     Cardiac Enzymes: No results for input(s): CKTOTAL, CKMB, CKMBINDEX, TROPONINI in the last 168 hours.  Lipid Panel: No results for input(s): CHOL, TRIG, HDL, CHOLHDL, VLDL, LDLCALC in the last 168 hours.  CBG: No results for input(s): GLUCAP in the last 168 hours.  Microbiology: Results for orders placed or performed during the hospital encounter of 11/18/08  Urine culture     Status: None   Collection Time: 11/18/08 10:10 PM  Result Value Ref Range Status   Specimen Description URINE, CLEAN CATCH  Final   Special Requests NONE  Final   Colony Count 50,000 COLONIES/ML  Final   Culture   Final    Multiple bacterial morphotypes present, none predominant. Suggest appropriate recollection if clinically indicated.   Report Status 11/20/2008 FINAL  Final    Coagulation Studies:  Recent Labs  10/25/16 1920  LABPROT 12.5  INR 0.93    Imaging: Ct Head Code Stroke Wo Contrast`  Result Date: 10/25/2016 CLINICAL DATA:  Code stroke. Acute onset RIGHT facial droop and slurred speech. Headache and altered mental status. EXAM: CT HEAD WITHOUT CONTRAST TECHNIQUE: Contiguous axial images were obtained from the base of the skull through the vertex without intravenous contrast. COMPARISON:   Maxillofacial CT January 07, 2014 FINDINGS: BRAIN: Acute 1.6 x 1.7 cm LEFT inferior basal ganglia hematoma, minimal surrounding hypodense vasogenic edema. No significant mass effect. No midline shift or acute large vascular territory infarct. Ventricles and sulci are overall normal for patient's age. Basal cisterns are patent. VASCULAR: Mild calcific atherosclerosis of the carotid siphons. SKULL: No skull fracture. No significant scalp soft tissue swelling. SINUSES/ORBITS: Trace LEFT mastoid effusion.The included ocular globes and orbital contents are non-suspicious. OTHER: None. ASPECTS [facility] Stroke Program Early CT Score) - Ganglionic level infarction (caudate, lentiform nuclei, internal capsule, insula, M1-M3 cortex): 7 - Supraganglionic infarction (M4-M6 cortex): 3 Total score (0-10 with 10 being normal): 10 IMPRESSION: 1. Acute LEFT basal ganglia 1.6 x 1.7 cm hematoma. No significant mass effect. 2. ASPECTS is 10. 3. Critical Value/emergent results were called by telephone at the time of interpretation on 10/25/2016 at 7:38 pm to Dr. Tomi Bamberger, who verbally acknowledged these results. Electronically Signed  By: Elon Alas M.D.   On: 10/25/2016 19:40         Assessment: 66 y.o. female with PMH significant for anemia who presented with acute onsent of R weakness and slurred speech was found to have acute L BG ICH with ICH score of 0, Etiology uncleasr -Admit to NSICU with frequent neuro checks -Keep SBP<140 -MRI/MRA  brain to R/O underlying lesion. -Neurosurgery consult called by ED Doc: no intervention -follow up [place] in AM for ICH stability   -PT/OT and speech eval for potential rehab  -GI/DVT prophylaxis     Arnaldo Natal, MD

## 2016-10-25 NOTE — ED Notes (Signed)
Called Code Stroke 1915 Called [place] in [place]

## 2016-10-25 NOTE — ED Triage Notes (Signed)
Per pt's daughter, pt was completely normal until 1830 when pt began having slurred speech and rt arm weakness.  Pt has slurred speech and confusion and rt arm drift.

## 2016-10-25 NOTE — ED Provider Notes (Signed)
[place] DEPT Provider Note   CSN: [id number] Arrival date & time: 10/25/16  1914     History   Chief Complaint Chief Complaint  Patient presents with  . Code Stroke    HPI Tamara Thompson is a 66 y.o. female.  HPI Patient presents to the emergency room with acute onset of slurred speech, right arm weakness that started about 6:30 this evening. Patient was home with her family. She cooked dinner this evening. She was in her usual state of health until approximately 1830. Patient started complaining of a headache. She began having slurred speech and some weakness in her right arm.  The patient's daughter states she was difficult to understand. She had drooping on the right side of her face. Her symptoms seemed to be getting a little better but only slightly. She continues to have difficulty with her speech Past Medical History:  Diagnosis Date  . Anemia   . Bleeding from the nose   . Deviated septum   . Kidney stones   . Medical history non-contributory     Patient Active Problem List   Diagnosis Date Noted  . ICH (intracerebral hemorrhage) ([place]) 10/25/2016    Past Surgical History:  Procedure Laterality Date  . ANKLE SURGERY  2010   left  . CESAREAN SECTION    . COLONOSCOPY N/A 12/14/2013   Procedure: COLONOSCOPY;  Surgeon: Danie Binder, MD;  [facility];  Service: Endoscopy;  Laterality: N/A;  10:30 AM  . NASAL ENDOSCOPY WITH EPISTAXIS CONTROL Bilateral 02/07/2015   Procedure: BILATERAL NASAL ENDOSCOPIC CAUTERIZATION;  Surgeon: Leta Baptist, MD;  Location: [place];  Service: ENT;  Laterality: Bilateral;  . NASAL SEPTOPLASTY W/ TURBINOPLASTY Bilateral 02/02/2014   Procedure: RIGHT NASAL SEPTOPLASTY WITH TURBINATE REDUCTION;  Surgeon: Ascencion Dike, MD;  Location: [place];  Service: ENT;  Laterality: Bilateral;  . OVARIAN CYST SURGERY    . POLYPECTOMY Right 02/02/2014   Procedure: RIGHT POLYPECTOMY NASAL;  Surgeon: Ascencion Dike, MD;  Location: [place];  Service: ENT;  Laterality: Right;  . Surgery of jaw and cheek fracture      OB History    No data available       Home Medications    Prior to Admission medications   Medication Sig Start Date End Date Taking? Authorizing Provider  cholecalciferol (VITAMIN D) 400 units TABS tablet Take 2,000 Units by mouth.   Yes [provider]  co-enzyme Q-10 30 MG capsule Take 100 mg by mouth daily.   Yes [provider]  ferrous sulfate 325 (65 FE) MG tablet Take 325 mg by mouth 3 (three) times daily with meals.   Yes [provider]    Family History Family History  Problem Relation Age of Onset  . Heart disease Mother   . Cancer Father        lung  . Stroke Father   . Colon cancer Neg Hx     Social History Social History  Substance Use Topics  . Smoking status: Former Smoker    Packs/day: 1.25    Years: 50.00    Types: Cigarettes    Quit date: 01/27/2010  . Smokeless tobacco: Never Used  . Alcohol use Yes     Comment: rarely     Allergies   Penicillins and Percocet [oxycodone-acetaminophen]   Review of Systems Review of Systems  All other systems reviewed and are negative.    Physical Exam Updated Vital  Signs BP 137/88   Pulse 73   Resp 17   Ht 1.676 m (5' 6" )   Wt 81.6 kg (180 lb)   SpO2 94%   BMI 29.05 kg/m   Physical Exam  Constitutional: She is oriented to person, place, and time. She appears well-developed and well-nourished. No distress.  HENT:  Head: Normocephalic and atraumatic.  Right Ear: External ear normal.  Left Ear: External ear normal.  Mouth/Throat: Oropharynx is clear and moist.  Eyes: Conjunctivae are normal. Right eye exhibits no discharge. Left eye exhibits no discharge. No scleral icterus.  Neck: Neck supple. No tracheal deviation present.  Cardiovascular: Normal rate, regular rhythm and intact distal pulses.   Pulmonary/Chest: Effort normal and breath sounds  normal. No stridor. No respiratory distress. She has no wheezes. She has no rales.  Abdominal: Soft. Bowel sounds are normal. She exhibits no distension. There is no tenderness. There is no rebound and no guarding.  Musculoskeletal: She exhibits no edema or tenderness.  Neurological: She is alert and oriented to person, place, and time. No cranial nerve deficit (slight right facial droop, extraocular movements intact, no slurred speech ) or sensory deficit. She exhibits normal muscle tone. She displays no seizure activity. Coordination normal.   pronator drift right upper extrem, unable to hold right legs off bed for 5 seconds, sensation intact in all extremities, no visual field cuts,  right sided neglect,  Finger to nose and heel shin are abnormal,  no nystagmus noted   Skin: Skin is warm and dry. No rash noted.  Psychiatric: She has a normal mood and affect.  Nursing note and vitals reviewed.    ED Treatments / Results  Labs (all labs ordered are listed, but only abnormal results are displayed) Labs Reviewed  COMPREHENSIVE METABOLIC PANEL - Abnormal; Notable for the following:       Result Value   Sodium 134 (*)    CO2 20 (*)    Glucose, Bld 186 (*)    AST 63 (*)    ALT 134 (*)    All other components within normal limits  I-STAT CHEM 8, ED - Abnormal; Notable for the following:    Glucose, Bld 187 (*)    All other components within normal limits  ETHANOL  PROTIME-INR  APTT  CBC  DIFFERENTIAL  RAPID URINE DRUG SCREEN, HOSP PERFORMED  URINALYSIS, ROUTINE W REFLEX MICROSCOPIC  I-STAT TROPOININ, ED     Radiology Ct Head Code Stroke Wo Contrast`  Result Date: 10/25/2016 CLINICAL DATA:  Code stroke. Acute onset RIGHT facial droop and slurred speech. Headache and altered mental status. EXAM: CT HEAD WITHOUT CONTRAST TECHNIQUE: Contiguous axial images were obtained from the base of the skull through the vertex without intravenous contrast. COMPARISON:  Maxillofacial CT January 07, 2014 FINDINGS: BRAIN: Acute 1.6 x 1.7 cm LEFT inferior basal ganglia hematoma, minimal surrounding hypodense vasogenic edema. No significant mass effect. No midline shift or acute large vascular territory infarct. Ventricles and sulci are overall normal for patient's age. Basal cisterns are patent. VASCULAR: Mild calcific atherosclerosis of the carotid siphons. SKULL: No skull fracture. No significant scalp soft tissue swelling. SINUSES/ORBITS: Trace LEFT mastoid effusion.The included ocular globes and orbital contents are non-suspicious. OTHER: None. ASPECTS [facility] Stroke Program Early CT Score) - Ganglionic level infarction (caudate, lentiform nuclei, internal capsule, insula, M1-M3 cortex): 7 - Supraganglionic infarction (M4-M6 cortex): 3 Total score (0-10 with 10 being normal): 10 IMPRESSION: 1. Acute LEFT basal ganglia 1.6 x 1.7 cm hematoma. No  significant mass effect. 2. ASPECTS is 10. 3. Critical Value/emergent results were called by telephone at the time of interpretation on 10/25/2016 at 7:38 pm to Dr. Tomi Bamberger, who verbally acknowledged these results. Electronically Signed   By: Elon Alas M.D.   On: 10/25/2016 19:40    Procedures .Critical Care Performed by: Dorie Rank Authorized by: Dorie Rank   Critical care provider statement:    Critical care time (minutes):  37   Critical care was time spent personally by me on the following activities:  Discussions with consultants, evaluation of patient's response to treatment, examination of patient, ordering and performing treatments and interventions, ordering and review of laboratory studies, ordering and review of radiographic studies, pulse oximetry, re-evaluation of patient's condition, obtaining history from patient or surrogate and review of old charts   (including critical care time)  Medications Ordered in ED Medications  sodium chloride 0.9 % bolus 500 mL (not administered)    Followed by  0.9 %  sodium chloride infusion (not  administered)     Initial Impression / Assessment and Plan / ED Course  I have reviewed the triage vital signs and the nursing notes.  Pertinent labs & imaging results that were available during my care of the patient were reviewed by me and considered in my medical decision making (see chart for details).  Clinical Course as of Oct 26 2019  Thu Oct 25, 2016  1926 Pt's sx are concerning for the possibility of stroke.  She is within the tpa window.  CT scan is pending.  Code stroke activated.  [JK]  1938 CT scan shows a small basal ganglia hemorrhage. This is the likely cause of the patient's neurologic symptoms. Obviously she is not a TPA candidate.  [JK]  2010 Neurosurgery was paged instead of neurohospitalist. Discussed with Dr Vertell Limber.  Pt does not require any neurosurgical intervention.  Agrees with neuro hospitalist admission  [JK]  2019 D/w Dr Paschal Dopp.  Will continue to monitor.  Transfer to [place].  [JK]    Clinical Course User Index [JK] Dorie Rank, MD   Patient presented to the emergency room with acute neurologic symptoms. Her symptoms were concerning for the possibility of an acute stroke. Patient's CT scan shows a basal ganglia  Hemorrhage.  BP is normal.  She is protecting her airway.  Remains stable.  Will need to monitor closely.  Transfer to [facility] neuro ICU  Final Clinical Impressions(s) / ED Diagnoses   Final diagnoses:  Basal ganglia hemorrhage [facility])      Dorie Rank, MD 10/25/16 2022

## 2016-10-25 NOTE — ED Notes (Signed)
Pt with bleed, no tpa, will transfer to [facility]

## 2016-10-25 NOTE — ED Notes (Signed)
Pt sent to MRI 

## 2016-10-25 NOTE — ED Notes (Signed)
Pt returned from MRI °

## 2016-10-26 ENCOUNTER — Inpatient Hospital Stay (HOSPITAL_COMMUNITY): Payer: Medicare Other

## 2016-10-26 ENCOUNTER — Encounter (HOSPITAL_COMMUNITY): Payer: Self-pay | Admitting: *Deleted

## 2016-10-26 DIAGNOSIS — R739 Hyperglycemia, unspecified: Secondary | ICD-10-CM

## 2016-10-26 DIAGNOSIS — I619 Nontraumatic intracerebral hemorrhage, unspecified: Secondary | ICD-10-CM

## 2016-10-26 DIAGNOSIS — E782 Mixed hyperlipidemia: Secondary | ICD-10-CM

## 2016-10-26 DIAGNOSIS — E785 Hyperlipidemia, unspecified: Secondary | ICD-10-CM

## 2016-10-26 DIAGNOSIS — I360 Nonrheumatic tricuspid (valve) stenosis: Secondary | ICD-10-CM

## 2016-10-26 DIAGNOSIS — Z87891 Personal history of nicotine dependence: Secondary | ICD-10-CM

## 2016-10-26 DIAGNOSIS — I671 Cerebral aneurysm, nonruptured: Secondary | ICD-10-CM

## 2016-10-26 LAB — HEPATIC FUNCTION PANEL
ALK PHOS: 56 U/L (ref 38–126)
ALT: 107 U/L — AB (ref 14–54)
AST: 49 U/L — AB (ref 15–41)
Albumin: 3.4 g/dL — ABNORMAL LOW (ref 3.5–5.0)
BILIRUBIN TOTAL: 0.5 mg/dL (ref 0.3–1.2)
Bilirubin, Direct: <0.1 mg/dL — ABNORMAL LOW (ref 0.1–0.5)
Total Protein: 6.2 g/dL — ABNORMAL LOW (ref 6.5–8.1)

## 2016-10-26 LAB — CBC
HEMATOCRIT: 36.5 % (ref 36.0–46.0)
HEMOGLOBIN: 12.1 g/dL (ref 12.0–15.0)
MCH: 30.6 pg (ref 26.0–34.0)
MCHC: 33.2 g/dL (ref 30.0–36.0)
MCV: 92.2 fL (ref 78.0–100.0)
Platelets: 212 10*3/uL (ref 150–400)
RBC: 3.96 MIL/uL (ref 3.87–5.11)
RDW: 13.6 % (ref 11.5–15.5)
WBC: 5.7 10*3/uL (ref 4.0–10.5)

## 2016-10-26 LAB — BASIC METABOLIC PANEL
ANION GAP: 5 (ref 5–15)
BUN: 9 mg/dL (ref 6–20)
CHLORIDE: 108 mmol/L (ref 101–111)
CO2: 24 mmol/L (ref 22–32)
Calcium: 8.3 mg/dL — ABNORMAL LOW (ref 8.9–10.3)
Creatinine, Ser: 0.73 mg/dL (ref 0.44–1.00)
GFR calc non Af Amer: >60 mL/min (ref >60)
Glucose, Bld: 199 mg/dL — ABNORMAL HIGH (ref 65–99)
Potassium: 3.8 mmol/L (ref 3.5–5.1)
Sodium: 137 mmol/L (ref 135–145)

## 2016-10-26 LAB — RAPID URINE DRUG SCREEN, HOSP PERFORMED
AMPHETAMINES: NOT DETECTED
BARBITURATES: NOT DETECTED
BENZODIAZEPINES: NOT DETECTED
COCAINE: NOT DETECTED
Opiates: POSITIVE — AB
TETRAHYDROCANNABINOL: NOT DETECTED

## 2016-10-26 LAB — URINALYSIS, ROUTINE W REFLEX MICROSCOPIC
Bilirubin Urine: NEGATIVE
Glucose, UA: 150 mg/dL — AB
Hgb urine dipstick: NEGATIVE
Ketones, ur: 5 mg/dL — AB
Nitrite: NEGATIVE
PH: 6 (ref 5.0–8.0)
Protein, ur: NEGATIVE mg/dL
SPECIFIC GRAVITY, URINE: 1.013 (ref 1.005–1.030)
SQUAMOUS EPITHELIAL / LPF: NONE SEEN

## 2016-10-26 LAB — GLUCOSE, CAPILLARY
Glucose-Capillary: 170 mg/dL — ABNORMAL HIGH (ref 65–99)
Glucose-Capillary: 196 mg/dL — ABNORMAL HIGH (ref 65–99)

## 2016-10-26 LAB — MRSA PCR SCREENING: MRSA by PCR: NEGATIVE

## 2016-10-26 MED ORDER — IOPAMIDOL (ISOVUE-370) INJECTION 76%
INTRAVENOUS | Status: AC
  Administered 2016-10-26: 50 mL
  Filled 2016-10-26: qty 50

## 2016-10-26 MED ORDER — ORAL CARE MOUTH RINSE
15.0000 mL | Freq: Two times a day (BID) | OROMUCOSAL | Status: DC
  Administered 2016-10-27: 15 mL via OROMUCOSAL

## 2016-10-26 MED ORDER — PNEUMOCOCCAL VAC POLYVALENT 25 MCG/0.5ML IJ INJ
0.5000 mL | INJECTION | INTRAMUSCULAR | Status: AC
  Administered 2016-10-27: 0.5 mL via INTRAMUSCULAR
  Filled 2016-10-26: qty 0.5

## 2016-10-26 MED ORDER — LABETALOL HCL 5 MG/ML IV SOLN: 10.0000 mg | INTRAVENOUS | Status: DC | PRN

## 2016-10-26 MED ORDER — INSULIN ASPART 100 UNIT/ML ~~LOC~~ SOLN
0.0000 [IU] | Freq: Three times a day (TID) | SUBCUTANEOUS | Status: DC
  Administered 2016-10-26: 2 [IU] via SUBCUTANEOUS
  Administered 2016-10-27 (×2): 1 [IU] via SUBCUTANEOUS

## 2016-10-26 MED ORDER — ATORVASTATIN CALCIUM 40 MG PO TABS
40.0000 mg | ORAL_TABLET | Freq: Every day | ORAL | Status: DC
  Filled 2016-10-26: qty 1

## 2016-10-26 MED ORDER — FERROUS SULFATE 325 (65 FE) MG PO TABS
325.0000 mg | ORAL_TABLET | Freq: Three times a day (TID) | ORAL | Status: DC
  Administered 2016-10-26 – 2016-10-27 (×3): 325 mg via ORAL
  Filled 2016-10-26 (×3): qty 1

## 2016-10-26 MED ORDER — ONDANSETRON HCL 4 MG/2ML IJ SOLN: 4.0000 mg | Freq: Four times a day (QID) | INTRAMUSCULAR | Status: DC | PRN

## 2016-10-26 MED ORDER — PANTOPRAZOLE SODIUM 40 MG PO TBEC
40.0000 mg | DELAYED_RELEASE_TABLET | Freq: Every day | ORAL | Status: DC
  Administered 2016-10-26 – 2016-10-27 (×2): 40 mg via ORAL
  Filled 2016-10-26 (×2): qty 1

## 2016-10-26 NOTE — Evaluation (Signed)
Speech Language Pathology Evaluation Patient Details Name: Tamara Thompson MRN: [id number] DOB: [birth year] Today's Date: 10/26/2016 Time: 7106-2694 SLP Time Calculation (min) (ACUTE ONLY): 20 min  Problem List:  Patient Active Problem List   Diagnosis Date Noted  . ICH (intracerebral hemorrhage) ([place]) 10/25/2016  . Nontraumatic acute hemorrhage of basal ganglia ([place]) 10/25/2016   Past Medical History:  Past Medical History:  Diagnosis Date  . Anemia   . Bleeding from the nose   . Deviated septum   . Kidney stones   . Medical history non-contributory    Past Surgical History:  Past Surgical History:  Procedure Laterality Date  . ANKLE SURGERY  2010   left  . CESAREAN SECTION    . COLONOSCOPY N/A 12/14/2013   Procedure: COLONOSCOPY;  Surgeon: Danie Binder, MD;  [facility];  Service: Endoscopy;  Laterality: N/A;  10:30 AM  . NASAL ENDOSCOPY WITH EPISTAXIS CONTROL Bilateral 02/07/2015   Procedure: BILATERAL NASAL ENDOSCOPIC CAUTERIZATION;  Surgeon: Leta Baptist, MD;  Location: [place];  Service: ENT;  Laterality: Bilateral;  . NASAL SEPTOPLASTY W/ TURBINOPLASTY Bilateral 02/02/2014   Procedure: RIGHT NASAL SEPTOPLASTY WITH TURBINATE REDUCTION;  Surgeon: Ascencion Dike, MD;  Location: [place];  Service: ENT;  Laterality: Bilateral;  . OVARIAN CYST SURGERY    . POLYPECTOMY Right 02/02/2014   Procedure: RIGHT POLYPECTOMY NASAL;  Surgeon: Ascencion Dike, MD;  Location: [place];  Service: ENT;  Laterality: Right;  . Surgery of jaw and cheek fracture     HPI:  Pt is a82 y.o.femalewith PMH significant for anemia who presented with acute onsent of R weakness and slurred speech, who was found to have acute L BG ICH.   Assessment / Plan / Recommendation Clinical Impression  Pt has a mild expressive aphasia with occasional semantic paraphasias and Min cues provided during divergent naming task. She also shows cognitive impairments  that include disorientation to situation, reduced retrieval of new information, and reduced sustained attention. At baseline she is independent. She will benefit from SLP f/u to maximize functional cognition and communication.    SLP Assessment  SLP Recommendation/Assessment: Patient needs continued Speech Lanaguage Pathology Services SLP Visit Diagnosis: Aphasia (R47.01);Cognitive communication deficit (R41.841)    Follow Up Recommendations   (tba)    Frequency and Duration min 2x/week  2 weeks      SLP Evaluation Cognition  Overall Cognitive Status: Impaired/Different from baseline Arousal/Alertness: Awake/alert Orientation Level: Oriented to person;Oriented to place;Oriented to time;Disoriented to situation Attention: Sustained Sustained Attention: Impaired Sustained Attention Impairment: Verbal basic Memory: Impaired Memory Impairment: Decreased recall of new information;Retrieval deficit       Comprehension  Auditory Comprehension Overall Auditory Comprehension: Appears within functional limits for tasks assessed    Expression Expression Primary Mode of Expression: Verbal Verbal Expression Overall Verbal Expression: Impaired Initiation: No impairment Automatic Speech: Name;Social Response Level of Generative/Spontaneous Verbalization: Conversation Naming: Impairment Confrontation: Within functional limits Divergent: 75-100% accurate Verbal Errors: Semantic paraphasias Pragmatics: No impairment Non-Verbal Means of Communication: Not applicable   Oral / Motor  Oral Motor/Sensory Function Overall Oral Motor/Sensory Function: Within functional limits Motor Speech Overall Motor Speech: Appears within functional limits for tasks assessed   GO                    Germain Osgood 10/26/2016, 12:00 PM  Germain Osgood, M.A. CCC-SLP [phone]

## 2016-10-26 NOTE — Progress Notes (Signed)
OT Cancellation Note  Patient Details Name: Tamara Thompson MRN: [id number] DOB: Aug 06, 1950   Cancelled Treatment:    Reason Eval/Treat Not Completed: Patient not medically ready. Pt on bedrest. Please update activity orders when appropriate for therapy.   [place], OT/L  [phone] 10/26/2016 10/26/2016, 7:11 AM

## 2016-10-26 NOTE — Evaluation (Signed)
Physical Therapy Evaluation Patient Details Name: Tamara Thompson MRN: [id number] DOB: Dec 27, 1950 Today's Date: 10/26/2016   History of Present Illness  Pt is a 66 y.o. female with PMH significant for anemia who presented with acute onsent of R weakness and slurred speech, who was found to have acute L BG ICH.   Clinical Impression  Patient presents with decreased independence and safety with mobility due to decreased balance, endurance and poor safety awareness.  She will benefit from skilled PT in the acute setting to allow return home with family support. May benefit from follow up outpatient PT depending on progress.  Has multilevel home, will need to be safe on stairs.     Follow Up Recommendations Outpatient PT    Equipment Recommendations  None recommended by PT    Recommendations for Other Services       Precautions / Restrictions Precautions Precautions: Fall      Mobility  Bed Mobility Overal bed mobility: Needs Assistance Bed Mobility: Supine to Sit     Supine to sit: Supervision     General bed mobility comments: assist for safety with multiple lines  Transfers Overall transfer level: Needs assistance Equipment used: 1 person hand held assist Transfers: Sit to/from Stand Sit to Stand: Min assist         General transfer comment: support for balance  Ambulation/Gait Ambulation/Gait assistance: Min assist Ambulation Distance (Feet): 300 Feet Assistive device: 1 person hand held assist Gait Pattern/deviations: Step-through pattern;Decreased stride length;Drifts right/left     General Gait Details: some imbalance with ambulation, HHA for safety, good pace, but decreased safety awareness with IV, etc  Stairs            Wheelchair Mobility    Modified Rankin (Stroke Patients Only) Modified Rankin (Stroke Patients Only) Pre-Morbid Rankin Score: No symptoms Modified Rankin: Moderately severe disability     Balance Overall balance assessment:  Needs assistance   Sitting balance-Leahy Scale: Good       Standing balance-Leahy Scale: Fair Standing balance comment: static standing unsupported, but weakness limited ambulation without assist                             Pertinent Vitals/Pain Pain Score: 5  Pain Descriptors / Indicators: Headache Pain Intervention(s): Monitored during session;Limited activity within patient's tolerance    Home Living Family/patient expects to be discharged to:: Private residence Living Arrangements: Spouse/significant other Available Help at Discharge: Family Type of Home: House Home Access: Stairs to enter Entrance Stairs-Rails: Right Entrance Stairs-Number of Steps: 3 Home Layout: Multi-level Home Equipment: None      Prior Function Level of Independence: Independent         Comments: does not drive at baseline     Hand Dominance        Extremity/Trunk Assessment   Upper Extremity Assessment Upper Extremity Assessment: Defer to OT evaluation    Lower Extremity Assessment Lower Extremity Assessment: Generalized weakness       Communication   Communication: No difficulties  Cognition Arousal/Alertness: Awake/alert Behavior During Therapy: WFL for tasks assessed/performed Overall Cognitive Status: Within Functional Limits for tasks assessed                                        General Comments General comments (skin integrity, edema, etc.): toileted on her own once assisted in bathroom  Exercises     Assessment/Plan    PT Assessment Patient needs continued PT services  PT Problem List Decreased strength;Decreased mobility;Decreased safety awareness;Decreased balance;Decreased activity tolerance;Decreased knowledge of precautions       PT Treatment Interventions DME instruction;Gait training;Therapeutic exercise;Patient/family education;Therapeutic activities;Stair training;Balance training;Functional mobility training    PT  Goals (Current goals can be found in the Care Plan section)  Acute Rehab PT Goals Patient Stated Goal: To go home PT Goal Formulation: With patient/family Time For Goal Achievement: 10/30/16 Potential to Achieve Goals: Good    Frequency Min 4X/week   Barriers to discharge        Co-evaluation               AM-PAC PT "6 Clicks" Daily Activity  Outcome Measure Difficulty turning over in bed (including adjusting bedclothes, sheets and blankets)?: None Difficulty moving from lying on back to sitting on the side of the bed? : A Little Difficulty sitting down on and standing up from a chair with arms (e.g., wheelchair, bedside commode, etc,.)?: Total Help needed moving to and from a bed to chair (including a wheelchair)?: A Little Help needed walking in hospital room?: A Little Help needed climbing 3-5 steps with a railing? : A Little 6 Click Score: 17    End of Session Equipment Utilized During Treatment: Gait belt Activity Tolerance: Patient tolerated treatment well Patient left: in chair;with call bell/phone within reach;with family/visitor present;with chair alarm set Nurse Communication: Mobility status PT Visit Diagnosis: Unsteadiness on feet (R26.81);Other abnormalities of gait and mobility (R26.89)    Time: 1435-1500 PT Time Calculation (min) (ACUTE ONLY): 25 min   Charges:   PT Evaluation $PT Eval Moderate Complexity: 1 Procedure PT Treatments $Gait Training: 8-22 mins   PT G CodesMagda Kiel, [place] [phone] 10/26/2016   Reginia Naas 10/26/2016, 6:09 PM

## 2016-10-26 NOTE — Progress Notes (Signed)
[facility] Progress Note Patient Name: Tamara Thompson DOB: 01-01-1951 MRN: [id number]   Date of Service  10/26/2016  HPI/Events of Note  New pt eval Camera no distress, vitals wnl Ich Needs MRI' Follow BP coags wnl  eICU Interventions       Intervention Category Evaluation Type: New Patient Evaluation  Raylene Miyamoto. 10/26/2016, 3:48 AM

## 2016-10-26 NOTE — Progress Notes (Signed)
STROKE TEAM PROGRESS NOTE   HISTORY OF PRESENT ILLNESS (per record) Tamara Thompson is an 66 y.o. female with PMH significant for anemia, recurrent epistaxis with endoscopic cauterization, deviated septum, and tobacco abuse (current smoker), who presented with an acute onset of slurred speech, right arm weakness that started about 1830 on 10/25/2016.  The patient reports that while she was cooking dinner that evening she had a headache and her daughter noted slurred speech, right facial droop, and right arm weakness.  EMS was called and code stroke activated en route.  The patient noted slight improvement of her symptoms prior to arriving at the hospital.  CT head showed 1.5X1.5 CM acute L BG ICH with surrounding edema.  Patient is not on anticoagulation.  Occasional mild hypertension documented in vital signs this admission.    Patient presents to the emergency room with She continues to have difficulty with her speech  Date last known well: 10/25/16 Time last known well: 630 pm Intracerebral Hemorrhage (ICH) Score 0  Patient was not administered IV t-PA secondary to [place]. She was admitted to the neuro ICU for further evaluation and treatment.  SUBJECTIVE (INTERVAL HISTORY) Her daughter is at the bedside.  The patient endorses moderately improved right arm strength and improved speech, but that she is compensating by speaking more slowly. MRA concerning for ACOM aneurysm, CTA head and neck pending.    OBJECTIVE Temp:  [97.7 F (36.5 C)-98.3 F (36.8 C)] 98.3 F (36.8 C) (07/06 1200) Pulse Rate:  [57-78] 65 (07/06 0900) Cardiac Rhythm: Sinus bradycardia;Normal sinus rhythm (07/06 0800) Resp:  [10-20] 12 (07/06 0900) BP: (96-142)/(58-88) 135/75 (07/06 0900) SpO2:  [93 %-100 %] 97 % (07/06 0900) Weight:  [81.6 kg (180 lb)-84.4 kg (186 lb 1.1 oz)] 84.4 kg (186 lb 1.1 oz) (07/05 2320)  CBC:   Recent Labs Lab 10/25/16 1920 10/25/16 1925 10/26/16 0700  WBC 6.3  --  5.7  NEUTROABS 3.0  --    --   HGB 12.6 13.3 12.1  HCT 37.5 39.0 36.5  MCV 90.8  --  92.2  PLT 259  --  387    Basic Metabolic Panel:   Recent Labs Lab 10/25/16 1920 10/25/16 1925 10/26/16 0700  NA 134* 139 137  K 3.7 3.8 3.8  CL 103 103 108  CO2 20*  --  24  GLUCOSE 186* 187* 199*  BUN 15 15 9   CREATININE 0.72 0.70 0.73  CALCIUM 9.2  --  8.3*    Lipid Panel:     Component Value Date/Time   CHOL 248 (H) 10/25/2016 1920   TRIG 208 (H) 10/25/2016 1920   HDL 47 10/25/2016 1920   CHOLHDL 5.3 10/25/2016 1920   VLDL 42 (H) 10/25/2016 1920   LDLCALC 159 (H) 10/25/2016 1920   HgbA1c: No results found for: HGBA1C Urine Drug Screen:     Component Value Date/Time   LABOPIA POSITIVE (A) 10/26/2016 0246   COCAINSCRNUR NONE DETECTED 10/26/2016 0246   LABBENZ NONE DETECTED 10/26/2016 0246   AMPHETMU NONE DETECTED 10/26/2016 0246   THCU NONE DETECTED 10/26/2016 0246   LABBARB NONE DETECTED 10/26/2016 0246    Alcohol Level     Component Value Date/Time   ETH <5 10/25/2016 1920    IMAGING I have personally reviewed the radiological images below and agree with the radiology interpretations.  Ct Angio Head and neck W and Wo Contrast 10/26/2016 IMPRESSION: 1. Left inferior basal ganglia hemorrhage appears stable since the MRI yesterday, with NO CTA spot sign.  And although the hemorrhage is in proximity to the left ICA terminus, no ICA aneurysm or regional vascular malformation is identified (see # 2). 2. There is a distal right ACA distal A2 segment 4 x 5 mm saccular aneurysm or small vascular malformation. This was occult on the MRI yesterday, favoring either an aneurysm or a slow flow malformation. 3. However, the anterior communicating artery is normal, no A-comm aneurysm. 4. Minimal atherosclerosis.  No stenosis. 5. No acute findings in the neck or visible chest.   Mr Brain 43 Contrast Mr Tamara Thompson Head Wo Contrast 10/25/2016 IMPRESSION: MRI HEAD: 1. Moderate to severely motion degraded examination. 2. Acute  LEFT inferior basal ganglia hematoma. MRA HEAD: 1. No emergent large vessel occlusion or severe stenosis on this moderately motion degraded examination. 2. 3 mm ACOM and 2 mm LEFT carotid terminus aneurysms versus motion artifact. Recommend CTA HEAD.  Ct Head Code Stroke Wo Contrast 10/25/2016 IMPRESSION: 1. Acute LEFT basal ganglia 1.6 x 1.7 cm hematoma. No significant mass effect. 2. ASPECTS is 10. 3.  2D Echo pending   PHYSICAL EXAM  Temp:  [97.7 F (36.5 C)-98.3 F (36.8 C)] 98.3 F (36.8 C) (07/06 1200) Pulse Rate:  [57-78] 60 (07/06 1400) Resp:  [10-20] 11 (07/06 1400) BP: (96-142)/(58-88) 99/67 (07/06 1400) SpO2:  [93 %-100 %] 97 % (07/06 0900) Weight:  [180 lb (81.6 kg)-186 lb 1.1 oz (84.4 kg)] 186 lb 1.1 oz (84.4 kg) (07/05 2320)  General - Well nourished, well developed, in no apparent distress.  Ophthalmologic - Sharp disc margins OU.   Cardiovascular - Regular rate and rhythm.  Mental Status -  Level of arousal and orientation to time, place, and person were intact. Language including expression, naming, repetition, comprehension was assessed and found intact. Fund of Knowledge was assessed and was intact.  Cranial Nerves II - XII - II - Visual field intact OU. III, IV, VI - Extraocular movements intact. V - Facial sensation intact bilaterally. VII - right nasolabial fold flattening. VIII - Hearing & vestibular intact bilaterally. X - Palate elevates symmetrically, slight dysarthria. XI - Chin turning & shoulder shrug intact bilaterally. XII - Tongue protrusion intact.  Motor Strength - The patient's strength was normal in all extremities except right UE pronator drift was present.  Bulk was normal and fasciculations were absent.   Motor Tone - Muscle tone was assessed at the neck and appendages and was normal.  Reflexes - The patient's reflexes were 1+ in all extremities and she had no pathological reflexes.  Sensory - Light touch, temperature/pinprick were  assessed and were symmetrical.    Coordination - The patient had normal movements in the hands with no ataxia or dysmetria.  Tremor was absent.  Gait and Station - deferred   ASSESSMENT/PLAN Ms. Tamara Thompson is a 66 y.o. female with history of anemia, recurrent epistaxis, deviated septum, and tobacco abuse (current smoker) presenting with acute onset of slurred speech, right arm weakness that started about 1830 on 10/25/2016. She did not receive IV t-PA due to [place].   Stroke: acute left inferior basal ganglia hematoma likely secondary to small vessel disease source. Risk factor including HLD, hyperglycemia (DM), former smoker  Resultant  Mild right facial drop and UE pronator drift  CT head: Acute LEFT basal ganglia 1.6 x 1.7 cm hematoma.  MRI head: Acute LEFT inferior basal ganglia hematoma.  CTA head and neck - stable hematoma, distal right A2 segment 4 x 5 mm saccular aneurysm or small vascular malformation. No  ACOM aneurysm  2D Echo  pending  LDL 159  HgbA1c pending  SCDs for VTE prophylaxis Diet clear liquid Room service appropriate? Yes; Fluid consistency: Thin  No antithrombotic prior to admission, now on No antithrombotic.   Ongoing aggressive stroke risk factor management  Therapy recommendations:  pending  Disposition:  Pending  Right A2 saccular aneurysm 4x59m  Showed on CTA  [place] up with either NSG or IR  BP management  No hx of HTN  BP well controlled without BP meds  SBP goal < 160  Hyperlipidemia - no hx of HLD reported  Home meds: none  LDL 159, goal < 70  Add lipitor 457m Continue statin on discharge.  Diabetes - no hx of DM reported  HgbA1c pending, goal < 7.0  Hyperglycemia  SSI  CBG monitoring  Other Stroke Risk Factors  Advanced age  Former smoker, quit 6 years ago  ETOH use, advised to drink no more than 1 drink(s) a day  Obesity, Body mass index is 30.03 kg/m.,  recommend weight loss, diet and exercise as appropriate   Family hx stroke (father)  Migraines  Other Active Problems  Anemia - chronic blood loss (epistaxis) - on iron supplement  Hospital day # 1  This patient is critically ill due to left ICH, hyperglycemia, HLD and at significant risk of neurological worsening, death form recurrent ICH. This patient's care requires constant monitoring of vital signs, hemodynamics, respiratory and cardiac monitoring, review of multiple databases, neurological assessment, discussion with family, other specialists and medical decision making of high complexity. I spent 25 minutes of neurocritical care time in the care of this patient.  JiRosalin HawkingMD PhD Stroke Neurology 10/26/2016 2:53 PM  To contact Stroke Continuity provider, please refer to [email or web address], contact General Neurology

## 2016-10-26 NOTE — Progress Notes (Signed)
Code stroke Beeper   720pm In room  724 Out of room  727  [place] and RAD [phone]

## 2016-10-26 NOTE — Progress Notes (Signed)
  Echocardiogram 2D Echocardiogram has been performed.  Darlina Sicilian M 10/26/2016, 11:12 AM

## 2016-10-26 NOTE — Evaluation (Signed)
Clinical/Bedside Swallow Evaluation Patient Details  Name: Tamara Thompson MRN: [id number] Date of Birth: [birth year]  Today's Date: 10/26/2016 Time: SLP Start Time (ACUTE ONLY): 1107 SLP Stop Time (ACUTE ONLY): 1117 SLP Time Calculation (min) (ACUTE ONLY): 10 min  Past Medical History:  Past Medical History:  Diagnosis Date  . Anemia   . Bleeding from the nose   . Deviated septum   . Kidney stones   . Medical history non-contributory    Past Surgical History:  Past Surgical History:  Procedure Laterality Date  . ANKLE SURGERY  2010   left  . CESAREAN SECTION    . COLONOSCOPY N/A 12/14/2013   Procedure: COLONOSCOPY;  Surgeon: Danie Binder, MD;  [facility];  Service: Endoscopy;  Laterality: N/A;  10:30 AM  . NASAL ENDOSCOPY WITH EPISTAXIS CONTROL Bilateral 02/07/2015   Procedure: BILATERAL NASAL ENDOSCOPIC CAUTERIZATION;  Surgeon: Leta Baptist, MD;  Location: [facility];  Service: ENT;  Laterality: Bilateral;  . NASAL SEPTOPLASTY W/ TURBINOPLASTY Bilateral 02/02/2014   Procedure: RIGHT NASAL SEPTOPLASTY WITH TURBINATE REDUCTION;  Surgeon: Ascencion Dike, MD;  Location: [facility];  Service: ENT;  Laterality: Bilateral;  . OVARIAN CYST SURGERY    . POLYPECTOMY Right 02/02/2014   Procedure: RIGHT POLYPECTOMY NASAL;  Surgeon: Ascencion Dike, MD;  Location: [place];  Service: ENT;  Laterality: Right;  . Surgery of jaw and cheek fracture     HPI:  Pt is a64 y.o.femalewith PMH significant for anemia who presented with acute onsent of R weakness and slurred speech, who was found to have acute L BG ICH.   Assessment / Plan / Recommendation Clinical Impression  Pt's oropharyngeal swallow appeared function as pt consumed water and small amounts of pureed solids. After only a few bites, she had a sudden onset of vomiting during which she appeared to regurgitate all of what she had just consumed. No other neuro changes noted, but RN notified.  Pt's daughter does report that she had some nausea last night as well. For today, recommend initiation of a clear liquid diet as pt can tolerate. If she does well with this, I think she has good prognosis for return to more solid POs. Will f/u for trials of additional solids. SLP Visit Diagnosis: Dysphagia, unspecified (R13.10)    Aspiration Risk  Mild aspiration risk    Diet Recommendation Thin liquid   Liquid Administration via: Cup;Straw Medication Administration: Whole meds with liquid Supervision: Patient able to self feed;Intermittent supervision to cue for compensatory strategies Compensations: Slow rate;Small sips/bites Postural Changes: Seated upright at 90 degrees;Remain upright for at least 30 minutes after po intake    Other  Recommendations Oral Care Recommendations: Oral care BID   Follow up Recommendations  (tba)      Frequency and Duration min 2x/week  2 weeks       Prognosis Prognosis for Safe Diet Advancement: Good      Swallow Study   General HPI: Pt is a75 y.o.femalewith PMH significant for anemia who presented with acute onsent of R weakness and slurred speech, who was found to have acute L BG ICH. Type of Study: Bedside Swallow Evaluation Previous Swallow Assessment: none in chart Diet Prior to this Study: NPO Temperature Spikes Noted: No Respiratory Status: Room air History of Recent Intubation: No Behavior/Cognition: Alert;Cooperative;Pleasant mood Oral Cavity Assessment: Within Functional Limits Oral Care Completed by SLP: No Oral Cavity - Dentition: Missing dentition Vision: Functional for self-feeding Self-Feeding Abilities: Able  to feed self Patient Positioning: Upright in bed Baseline Vocal Quality: Normal Volitional Cough: Strong Volitional Swallow: Able to elicit    Oral/Motor/Sensory Function Overall Oral Motor/Sensory Function: Within functional limits   Ice Chips Ice chips: Not tested   Thin Liquid Thin Liquid: Within functional  limits Presentation: Cup;Self Fed;Straw    Nectar Thick Nectar Thick Liquid: Not tested   Honey Thick Honey Thick Liquid: Not tested   Puree Puree: Within functional limits Presentation: Self Fed;Spoon   Solid   GO   Solid: Not tested        Germain Osgood 10/26/2016,11:54 AM  Germain Osgood, M.A. CCC-SLP [phone]

## 2016-10-27 DIAGNOSIS — I610 Nontraumatic intracerebral hemorrhage in hemisphere, subcortical: Principal | ICD-10-CM

## 2016-10-27 LAB — BASIC METABOLIC PANEL
Anion gap: 6 (ref 5–15)
BUN: 8 mg/dL (ref 6–20)
CHLORIDE: 109 mmol/L (ref 101–111)
CO2: 24 mmol/L (ref 22–32)
CREATININE: 0.69 mg/dL (ref 0.44–1.00)
Calcium: 8.7 mg/dL — ABNORMAL LOW (ref 8.9–10.3)
GFR calc Af Amer: >60 mL/min (ref >60)
GFR calc non Af Amer: >60 mL/min (ref >60)
GLUCOSE: 149 mg/dL — AB (ref 65–99)
POTASSIUM: 3.8 mmol/L (ref 3.5–5.1)
SODIUM: 139 mmol/L (ref 135–145)

## 2016-10-27 LAB — GLUCOSE, CAPILLARY: GLUCOSE-CAPILLARY: 138 mg/dL — AB (ref 65–99)

## 2016-10-27 LAB — CBC
HCT: 39.1 % (ref 36.0–46.0)
HEMOGLOBIN: 12.4 g/dL (ref 12.0–15.0)
MCH: 29.7 pg (ref 26.0–34.0)
MCHC: 31.7 g/dL (ref 30.0–36.0)
MCV: 93.8 fL (ref 78.0–100.0)
PLATELETS: 250 10*3/uL (ref 150–400)
RBC: 4.17 MIL/uL (ref 3.87–5.11)
RDW: 13.8 % (ref 11.5–15.5)
WBC: 5.8 10*3/uL (ref 4.0–10.5)

## 2016-10-27 LAB — HEMOGLOBIN A1C
HEMOGLOBIN A1C: 7.2 % — AB (ref 4.8–5.6)
Mean Plasma Glucose: 160 mg/dL

## 2016-10-27 MED ORDER — ATORVASTATIN CALCIUM 40 MG PO TABS: 40.0000 mg | ORAL_TABLET | Freq: Every day | ORAL | 6 refills | Status: DC

## 2016-10-27 MED ORDER — COENZYME Q10 30 MG PO CAPS: 30.0000 mg | ORAL_CAPSULE | Freq: Every day | ORAL | Status: DC

## 2016-10-27 NOTE — Progress Notes (Signed)
STROKE TEAM PROGRESS NOTE   HISTORY OF PRESENT ILLNESS (per record) Tamara Thompson is an 66 y.o. female with PMH significant for anemia, recurrent epistaxis with endoscopic cauterization, deviated septum, and tobacco abuse (current smoker), who presented with an acute onset of slurred speech, right arm weakness that started about 1830 on 10/25/2016.  The patient reports that while she was cooking dinner that evening she had a headache and her daughter noted slurred speech, right facial droop, and right arm weakness.  EMS was called and code stroke activated en route.  The patient noted slight improvement of her symptoms prior to arriving at the hospital.  CT head showed 1.5X1.5 CM acute L BG ICH with surrounding edema.  Patient is not on anticoagulation.  Occasional mild hypertension documented in vital signs this admission.    Patient presents to the emergency room with She continues to have difficulty with her speech  Date last known well: 10/25/16 Time last known well: 630 pm Intracerebral Hemorrhage (ICH) Score 0  Patient was not administered IV t-PA secondary to [place]. She was admitted to the neuro ICU for further evaluation and treatment.  SUBJECTIVE (INTERVAL HISTORY) Her family is not  at the bedside.    CTA head  Shows small 2 mm A2 segment aneurysm versus small AVM which is not likely source of her left thalamic hemorrhage  OBJECTIVE Temp:  [97.6 F (36.4 C)-98.5 F (36.9 C)] 98.5 F (36.9 C) (07/07 1011) Pulse Rate:  [60-88] 73 (07/07 1011) Cardiac Rhythm: Normal sinus rhythm (07/07 0701) Resp:  [11-18] 18 (07/07 1011) BP: (99-132)/(62-77) 128/74 (07/07 1011) SpO2:  [97 %-99 %] 98 % (07/07 1011) Weight:  [197 lb 12.8 oz (89.7 kg)] 197 lb 12.8 oz (89.7 kg) (07/06 2253)  CBC:   Recent Labs Lab 10/25/16 1920  10/26/16 0700 10/27/16 0524  WBC 6.3  --  5.7 5.8  NEUTROABS 3.0  --   --   --   HGB 12.6  < > 12.1 12.4  HCT 37.5  < > 36.5 39.1  MCV 90.8  --  92.2 93.8  PLT 259   --  212 250  < > = values in this interval not displayed.  Basic Metabolic Panel:   Recent Labs Lab 10/26/16 0700 10/27/16 0524  NA 137 139  K 3.8 3.8  CL 108 109  CO2 24 24  GLUCOSE 199* 149*  BUN 9 8  CREATININE 0.73 0.69  CALCIUM 8.3* 8.7*    Lipid Panel:     Component Value Date/Time   CHOL 248 (H) 10/25/2016 1920   TRIG 208 (H) 10/25/2016 1920   HDL 47 10/25/2016 1920   CHOLHDL 5.3 10/25/2016 1920   VLDL 42 (H) 10/25/2016 1920   LDLCALC 159 (H) 10/25/2016 1920   HgbA1c:  Lab Results  Component Value Date   HGBA1C 7.2 (H) 10/26/2016   Urine Drug Screen:     Component Value Date/Time   LABOPIA POSITIVE (A) 10/26/2016 0246   COCAINSCRNUR NONE DETECTED 10/26/2016 0246   LABBENZ NONE DETECTED 10/26/2016 0246   AMPHETMU NONE DETECTED 10/26/2016 0246   THCU NONE DETECTED 10/26/2016 0246   LABBARB NONE DETECTED 10/26/2016 0246    Alcohol Level     Component Value Date/Time   ETH <5 10/25/2016 1920    IMAGING I have personally reviewed the radiological images below and agree with the radiology interpretations.  Ct Angio Head and neck W and Wo Contrast 10/26/2016 IMPRESSION: 1. Left inferior basal ganglia hemorrhage appears stable since  the MRI yesterday, with NO CTA spot sign. And although the hemorrhage is in proximity to the left ICA terminus, no ICA aneurysm or regional vascular malformation is identified (see # 2). 2. There is a distal right ACA distal A2 segment 4 x 5 mm saccular aneurysm or small vascular malformation. This was occult on the MRI yesterday, favoring either an aneurysm or a slow flow malformation. 3. However, the anterior communicating artery is normal, no A-comm aneurysm. 4. Minimal atherosclerosis.  No stenosis. 5. No acute findings in the neck or visible chest.   Mr Brain 63 Contrast Mr Jodene Nam Head Wo Contrast 10/25/2016 IMPRESSION: MRI HEAD: 1. Moderate to severely motion degraded examination. 2. Acute LEFT inferior basal ganglia hematoma. MRA  HEAD: 1. No emergent large vessel occlusion or severe stenosis on this moderately motion degraded examination. 2. 3 mm ACOM and 2 mm LEFT carotid terminus aneurysms versus motion artifact. Recommend CTA HEAD.  Ct Head Code Stroke Wo Contrast 10/25/2016 IMPRESSION: 1. Acute LEFT basal ganglia 1.6 x 1.7 cm hematoma. No significant mass effect. 2. ASPECTS is 10. 3.  2D Echo pending   PHYSICAL EXAM  Temp:  [97.6 F (36.4 C)-98.5 F (36.9 C)] 98.5 F (36.9 C) (07/07 1011) Pulse Rate:  [60-88] 73 (07/07 1011) Resp:  [11-18] 18 (07/07 1011) BP: (99-132)/(62-77) 128/74 (07/07 1011) SpO2:  [97 %-99 %] 98 % (07/07 1011) Weight:  [197 lb 12.8 oz (89.7 kg)] 197 lb 12.8 oz (89.7 kg) (07/06 2253)  General - Well nourished, well developed, in no apparent distress.  Ophthalmologic - Sharp disc margins OU.   Cardiovascular - Regular rate and rhythm.  Mental Status -  Level of arousal and orientation to time, place, and person were intact. Language including expression, naming, repetition, comprehension was assessed and found intact. Fund of Knowledge was assessed and was intact.  Cranial Nerves II - XII - II - Visual field intact OU. III, IV, VI - Extraocular movements intact. V - Facial sensation intact bilaterally. VII - right nasolabial fold flattening. VIII - Hearing & vestibular intact bilaterally. X - Palate elevates symmetrically, slight dysarthria. XI - Chin turning & shoulder shrug intact bilaterally. XII - Tongue protrusion intact.  Motor Strength - The patient's strength was normal in all extremities except diminished fine finger movements on rightt.  Bulk was normal and fasciculations were absent.   Motor Tone - Muscle tone was assessed at the neck and appendages and was normal.  Reflexes - The patient's reflexes were 1+ in all extremities and she had no pathological reflexes.  Sensory - Light touch, temperature/pinprick were assessed and were symmetrical.    Coordination -  The patient had normal movements in the hands with no ataxia or dysmetria.  Tremor was absent.  Gait and Station - deferred   ASSESSMENT/PLAN Ms. Cheryal Salas is a 66 y.o. female with history of anemia, recurrent epistaxis, deviated septum, and tobacco abuse (current smoker) presenting with acute onset of slurred speech, right arm weakness that started about 1830 on 10/25/2016. She did not receive IV t-PA due to [place].   Stroke: acute left inferior basal ganglia hematoma likely secondary to hypertensive small vessel disease  . Risk factor including HLD, hyperglycemia (DM), former smoker  Resultant  Mild right facial drop and UE pronator drift  CT head: Acute LEFT basal ganglia 1.6 x 1.7 cm hematoma.  MRI head: Acute LEFT inferior basal ganglia hematoma.  CTA head and neck - stable hematoma, distal right A2 segment 4 x 5 mm  saccular aneurysm or small vascular malformation. No ACOM aneurysm  2D Echo  pending  LDL 159  HgbA1c 7.2  SCDs for VTE prophylaxis Diet heart healthy/carb modified Room service appropriate? Yes; Fluid consistency: Thin  No antithrombotic prior to admission, now on No antithrombotic.   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  Diabetes - no hx of DM reported  HgbA1c 7.2, goal < 7.0  Hyperglycemia  SSI  CBG monitoring  Other Stroke Risk Factors  Advanced age  Former smoker, quit 6 years ago  ETOH use, advised to drink no more than 1 drink(s) a day  Obesity, Body mass index is 32.92 kg/m., recommend weight loss, diet and exercise as  appropriate   Family hx stroke (father)  Migraines  Other Active Problems  Anemia - chronic blood loss (epistaxis) - on iron supplement  Hospital day # 2  I have personally examined this patient, reviewed notes, independently viewed imaging studies, participated in medical decision making and plan of care.ROS completed by me personally and pertinent positives fully documented  I have made any additions or clarifications directly to the above note. Patient has elevated hemoglobin A1c and likely new onset of diabetes and will need diabetes counseling. She was advised to follow-up with her primary care physician for diabetes treatment. Likely discharge home later today and outpatient follow-up in the stroke clinic in 6 weeks. I discussed patient's imaging films with neurosurgeon Dr. CaCyndy Freezeeurosurgeon who agrees that  the patient's small anterior communicating artery aneurysm is likely unrelated to her thalamic hemorrhage and can be followed conservatively as an outpatient  PrAntony ContrasMDPearl Cityager: [phone]/10/2016 12:00 PM  PrAntony Contrastroke Neurology 10/27/2016 11:56 AM  To contact Stroke Continuity provider, please refer to [email or web address], contact General Neurology

## 2016-10-27 NOTE — Evaluation (Signed)
Occupational Therapy Evaluation/Discharge Patient Details Name: Tamara Thompson MRN: [id number] DOB: May 05, 1950 Today's Date: 10/27/2016    History of Present Illness Pt is a 66 y.o. female with PMH significant for anemia who presented with acute onsent of R weakness and slurred speech, who was found to have acute L BG ICH.    Clinical Impression   PTA, pt was independent with all ADL and functional mobility. Pt currently presents with mild LE weakness causing mild balance deficits. Pt only required min assist for initial sit-stand transfer and was able to complete all grooming, bathing and dressing tasks with modified independence. No functional vision or cognitive deficits noted - pt appeared to acknowledge limitations and maintained safety awareness throughout session. Pt reports that she lives with her husband and will have 24/7 supervision at home. No DME recommendations at this time. Pt with no further acute OT needs and pt discharged from OT services. OT signing off.     Follow Up Recommendations  No OT follow up;Supervision - Intermittent    Equipment Recommendations  None recommended by OT    Recommendations for Other Services       Precautions / Restrictions Precautions Precautions: Fall Restrictions Weight Bearing Restrictions: No      Mobility Bed Mobility Overal bed mobility: Modified Independent             General bed mobility comments: HOB flat, no use of bedrails, exited on R side to simulate home environment  Transfers Overall transfer level: Needs assistance Equipment used: 1 person hand held assist Transfers: Sit to/from Stand Sit to Stand: Min assist         General transfer comment: Min assist for initial sit-stand transfer. All other transfers pt able to complete with modified indpendence. Pt reports that she feels increased weakness in her legs and that's why she needed hand held assist initially.     Balance Overall balance assessment: Needs  assistance Sitting-balance support: No upper extremity supported;Feet supported Sitting balance-Leahy Scale: Good Sitting balance - Comments: Pt with increased low back impacting comfort while sitting   Standing balance support: No upper extremity supported;During functional activity Standing balance-Leahy Scale: Good Standing balance comment: mild weakness in LE - no LOB or concerns noted with balance                           ADL either performed or assessed with clinical judgement   ADL Overall ADL's : Modified independent                                       General ADL Comments: Pt completed grooming, sponge bathing, dressing tasks with modified independence with setup provided due to unfamiliar environment. No LOB or concerns of safety noted during ADL session. No family present during OT evaluation.     Vision Baseline Vision/History: Wears glasses Wears Glasses: Reading only Patient Visual Report: No change from baseline Vision Assessment?: No apparent visual deficits     Perception Perception Perception Tested?: Yes Comments: WFL   Praxis Praxis Praxis tested?: Within functional limits    Pertinent Vitals/Pain Pain Assessment: 0-10 Pain Score: 6  Pain Location: low back Pain Descriptors / Indicators: Aching Pain Intervention(s): Repositioned;Monitored during session     Hand Dominance Right   Extremity/Trunk Assessment Upper Extremity Assessment Upper Extremity Assessment: Overall WFL for tasks assessed   Lower Extremity Assessment  Lower Extremity Assessment: Defer to PT evaluation   Cervical / Trunk Assessment Cervical / Trunk Assessment: Normal   Communication Communication Communication: No difficulties   Cognition Arousal/Alertness: Awake/alert Behavior During Therapy: WFL for tasks assessed/performed Overall Cognitive Status: Within Functional Limits for tasks assessed                                      General Comments       Exercises     Shoulder Instructions      Home Living Family/patient expects to be discharged to:: Private residence Living Arrangements: Spouse/significant other Available Help at Discharge: Family;Available 24 hours/day Type of Home: House Home Access: Stairs to enter [employer] of Steps: 3 Entrance Stairs-Rails: Right Home Layout: Multi-level Alternate Level Stairs-Number of Steps: flight Alternate Level Stairs-Rails: Left Bathroom Shower/Tub: Teacher, early years/pre: Standard     Home Equipment: Environmental consultant - 2 wheels      Lives With: Spouse;Other (Comment) (daughter lives next door)    Prior Functioning/Environment Level of Independence: Independent        Comments: does not drive at baseline        OT Problem List: Decreased strength;Decreased activity tolerance;Impaired balance (sitting and/or standing);Pain      OT Treatment/Interventions:      OT Goals(Current goals can be found in the care plan section) Acute Rehab OT Goals Patient Stated Goal: To go home OT Goal Formulation: With patient Time For Goal Achievement: 11/10/16 Potential to Achieve Goals: Good  OT Frequency:     Barriers to D/C:            Co-evaluation              AM-PAC PT "6 Clicks" Daily Activity     Outcome Measure Help from another person eating meals?: None Help from another person taking care of personal grooming?: None Help from another person toileting, which includes using toliet, bedpan, or urinal?: None Help from another person bathing (including washing, rinsing, drying)?: A Little Help from another person to put on and taking off regular upper body clothing?: None Help from another person to put on and taking off regular lower body clothing?: None 6 Click Score: 23   End of Session Equipment Utilized During Treatment: Gait belt Nurse Communication: Mobility status  Activity Tolerance: Patient tolerated  treatment well Patient left: in bed;with call bell/phone within reach;with bed alarm set  OT Visit Diagnosis: Unsteadiness on feet (R26.81)                Time: 4765-4650 OT Time Calculation (min): 20 min Charges:  OT General Charges $OT Visit: 1 Procedure OT Evaluation $OT Eval Low Complexity: 1 Procedure G-Codes:     Redmond Baseman, MS, OTR/L 10/27/2016, 10:54 AM

## 2016-10-27 NOTE — Discharge Summary (Signed)
Stroke Discharge Summary  Patient ID: Tamara Thompson   MRN: [id number]      DOB: 10/08/1950  Date of Admission: 10/25/2016 Date of Discharge: 10/27/2016  Attending Physician:  Rosalin Hawking, MD, Stroke MD Consultant(s):    None  Patient's PCP:  Redmond School, MD  DISCHARGE DIAGNOSIS:  Acute LEFT inferior basal ganglia hemorrhage. Principal Problem:   ICH (intracerebral hemorrhage) (HCC) Active Problems:   Nontraumatic acute hemorrhage of basal ganglia (HCC)   HLD (hyperlipidemia)   Hyperglycemia   Former smoker   Cerebral aneurysm   Past Medical History:  Diagnosis Date  . Anemia   . Bleeding from the nose   . Deviated septum   . Kidney stones   . Medical history non-contributory    Past Surgical History:  Procedure Laterality Date  . ANKLE SURGERY  2010   left  . CESAREAN SECTION    . COLONOSCOPY N/A 12/14/2013   Procedure: COLONOSCOPY;  Surgeon: Danie Binder, MD;  [facility];  Service: Endoscopy;  Laterality: N/A;  10:30 AM  . NASAL ENDOSCOPY WITH EPISTAXIS CONTROL Bilateral 02/07/2015   Procedure: BILATERAL NASAL ENDOSCOPIC CAUTERIZATION;  Surgeon: Leta Baptist, MD;  Location: [place];  Service: ENT;  Laterality: Bilateral;  . NASAL SEPTOPLASTY W/ TURBINOPLASTY Bilateral 02/02/2014   Procedure: RIGHT NASAL SEPTOPLASTY WITH TURBINATE REDUCTION;  Surgeon: Ascencion Dike, MD;  Location: [place];  Service: ENT;  Laterality: Bilateral;  . OVARIAN CYST SURGERY    . POLYPECTOMY Right 02/02/2014   Procedure: RIGHT POLYPECTOMY NASAL;  Surgeon: Ascencion Dike, MD;  Location: [place];  Service: ENT;  Laterality: Right;  . Surgery of jaw and cheek fracture      Allergies as of 10/27/2016      Reactions   Penicillins    Unknown Reaction   Percocet [oxycodone-acetaminophen] Rash      Medication List    TAKE these medications   atorvastatin 40 MG tablet Commonly known as:  LIPITOR Take 1 tablet (40 mg total) by mouth  daily at 6 PM.   cholecalciferol 400 units Tabs tablet Commonly known as:  VITAMIN D Take 2,000 Units by mouth.   co-enzyme Q-10 30 MG capsule Take 1 capsule (30 mg total) by mouth daily. What changed:  how much to take   ferrous sulfate 325 (65 FE) MG tablet Take 325 mg by mouth 3 (three) times daily with meals.       LABORATORY STUDIES CBC    Component Value Date/Time   WBC 5.8 10/27/2016 0524   RBC 4.17 10/27/2016 0524   HGB 12.4 10/27/2016 0524   HCT 39.1 10/27/2016 0524   PLT 250 10/27/2016 0524   MCV 93.8 10/27/2016 0524   MCH 29.7 10/27/2016 0524   MCHC 31.7 10/27/2016 0524   RDW 13.8 10/27/2016 0524   LYMPHSABS 2.1 10/25/2016 1920   MONOABS 0.7 10/25/2016 1920   EOSABS 0.4 10/25/2016 1920   BASOSABS 0.1 10/25/2016 1920   CMP    Component Value Date/Time   NA 139 10/27/2016 0524   K 3.8 10/27/2016 0524   CL 109 10/27/2016 0524   CO2 24 10/27/2016 0524   GLUCOSE 149 (H) 10/27/2016 0524   BUN 8 10/27/2016 0524   CREATININE 0.69 10/27/2016 0524   CALCIUM 8.7 (L) 10/27/2016 0524   PROT 6.2 (L) 10/26/2016 0700   ALBUMIN 3.4 (L) 10/26/2016 0700   AST 49 (H) 10/26/2016 0700   ALT 107 (  H) 10/26/2016 0700   ALKPHOS 56 10/26/2016 0700   BILITOT 0.5 10/26/2016 0700   GFRNONAA >60 10/27/2016 0524   GFRAA >60 10/27/2016 0524   COAGS Lab Results  Component Value Date   INR 0.93 10/25/2016   INR 0.9 11/18/2008   Lipid Panel    Component Value Date/Time   CHOL 248 (H) 10/25/2016 1920   TRIG 208 (H) 10/25/2016 1920   HDL 47 10/25/2016 1920   CHOLHDL 5.3 10/25/2016 1920   VLDL 42 (H) 10/25/2016 1920   LDLCALC 159 (H) 10/25/2016 1920   HgbA1C  Lab Results  Component Value Date   HGBA1C 7.2 (H) 10/26/2016   Urinalysis    Component Value Date/Time   COLORURINE YELLOW 10/26/2016 0247   APPEARANCEUR CLEAR 10/26/2016 0247   LABSPEC 1.013 10/26/2016 0247   PHURINE 6.0 10/26/2016 0247   GLUCOSEU 150 (A) 10/26/2016 0247   HGBUR NEGATIVE 10/26/2016 0247    BILIRUBINUR NEGATIVE 10/26/2016 0247   KETONESUR 5 (A) 10/26/2016 0247   PROTEINUR NEGATIVE 10/26/2016 0247   UROBILINOGEN 0.2 11/18/2008 2207   NITRITE NEGATIVE 10/26/2016 0247   LEUKOCYTESUR TRACE (A) 10/26/2016 0247   Urine Drug Screen     Component Value Date/Time   LABOPIA POSITIVE (A) 10/26/2016 0246   COCAINSCRNUR NONE DETECTED 10/26/2016 0246   LABBENZ NONE DETECTED 10/26/2016 0246   AMPHETMU NONE DETECTED 10/26/2016 0246   THCU NONE DETECTED 10/26/2016 0246   LABBARB NONE DETECTED 10/26/2016 0246    Alcohol Level    Component Value Date/Time   ETH <5 10/25/2016 1920     SIGNIFICANT DIAGNOSTIC STUDIES   Ct Angio Head and neck W and Wo Contrast 10/26/2016 1. Left inferior basal ganglia hemorrhage appears stable since the MRI yesterday, with NO CTA spot sign. And although the hemorrhage is in proximity to the left ICA terminus, no ICA aneurysm or regional vascular malformation is identified (see # 2).  2. There is a distal right ACA distal A2 segment 4 x 5 mm saccular aneurysm or small vascular malformation.   This was occult on the MRI yesterday, favoring either an aneurysm or a slow flow malformation.  3. However, the anterior communicating artery is normal, no A-comm aneurysm.  4. Minimal atherosclerosis.  No stenosis.  5. No acute findings in the neck or visible chest.    Mr Virgel Paling Wo Contrast 10/25/2016  MRI  1. Moderate to severely motion degraded examination. 2. Acute LEFT inferior basal ganglia hematoma.  MRA  1. No emergent large vessel occlusion or severe stenosis on this moderately motion degraded examination. 2. 3 mm ACOM and 2 mm LEFT carotid terminus aneurysms versus motion artifact. Recommend CTA HEAD.   Ct Head Code Stroke Wo Contrast 10/25/2016 1. Acute LEFT basal ganglia 1.6 x 1.7 cm hematoma. No significant mass effect.  2. ASPECTS is 10.    2D Echo pending    HISTORY OF PRESENT ILLNESS Tamara Thompson is a 66 y.o. female with a PMH  significant for anemia, who was in her usual state of health up until 630 pm on the day of admission when she had sudden onsent of slurred speech and R weakness. She presented to [facility] ED where stroke code was activated. CTH was ordered and showed 1.5X1.5 CM acute L BG ICH with surrounding edema. The pt was not taking any blood thinner and her BP was normal. She was transferred to [facility] Neuro ICU for further evaluation.  The pt denied any trauma, chest pain, N/V or SOB.  Date last known well: 10/25/16 Time last known well: 630 pm tPA Given: NO due to [place] Intracerebral Hemorrhage ([place]) Score 0  HOSPITAL COURSE Ms. Jilliann Subramanian is a 66 y.o. female with history of anemia, recurrent epistaxis, deviated septum, and tobacco abuse (current smoker) presenting with acute onset of slurred speech, right arm weakness that started about 1830 on 10/25/2016. She did not receive IV t-PA due to [place]. She was admitted to the neuro intensive care unit for close monitoring and further treatment.  Stroke: acute left inferior basal ganglia hematoma likely secondary to hypertensive small vessel disease.  Risk factor including HLD, hyperglycemia (DM), smoker  Resultant  Mild right facial drop and UE pronator drift  CT head: Acute LEFT basal ganglia 1.6 x 1.7 cm hematoma.  MRI head: Acute LEFT inferior basal ganglia hematoma.  CTA head and neck - stable hematoma, distal right A2 segment 4 x 5 mm saccular aneurysm or small vascular malformation. No ACOM aneurysm  2D Echo  pending  LDL 159  HgbA1c 7.2  SCDs for VTE prophylaxis  Diet heart healthy/carb modified Room service appropriate? Yes; Fluid consistency: Thin  No antithrombotic prior to admission, now on No antithrombotic.   Ongoing aggressive stroke risk factor management  Therapy recommendations:   Outpatient physical therapy recommended.  Disposition:  Pending  Right A2 saccular aneurysm 4x26m  Showed on CTA  Not likely related to the IColt Need  outpt follow up with either NSG or IR  Further follow-up to be discussed with Dr. XErlinda Hong  BP management  No hx of HTN  BP well controlled without BP meds  SBP goal < 160  Hyperlipidemia - no hx of HLD reported  Home meds: No lipid lowering medications prior to admission.  LDL 159, goal < 70  Add lipitor 457m Continue statin on discharge.  Mildly elevated liver function tests will need further monitoring.  Diabetes - no hx of DM reported  HgbA1c 7.2, goal < 7.0  Hyperglycemia  SSI  CBG monitoring - glucose in the 140 to 200 range.  Follow-up ASAP with primary care physician.  Other Stroke Risk Factors  Advanced age  Former smoker, quit 6 years ago  ETOH use, advised to drink no more than 1 drink(s) a day  Obesity, Body mass index is 32.92 kg/m., recommend weight loss, diet and exercise as appropriate   Family hx stroke (father)  Migraines  Other Active Problems  Anemia - chronic blood loss (epistaxis) - on iron supplement  DISCHARGE EXAM Blood pressure 128/74, pulse 73, temperature 98.5 F (36.9 C), temperature source Oral, resp. rate 18, height 5' 5"  (1.651 m), weight 89.7 kg (197 lb 12.8 oz), SpO2 98 %.   General - Well nourished, well developed, in no apparent distress.  Ophthalmologic - Sharp disc margins OU.   Cardiovascular - Regular rate and rhythm.  Mental Status -  Level of arousal and orientation to time, place, and person were intact. Language including expression, naming, repetition, comprehension was assessed and found intact. Fund of Knowledge was assessed and was intact.  Cranial Nerves II - XII - II - Visual field intact OU. III, IV, VI - Extraocular movements intact. V - Facial sensation intact bilaterally. VII - right nasolabial fold flattening. VIII - Hearing & vestibular intact bilaterally. X - Palate elevates symmetrically, slight dysarthria. XI - Chin turning & shoulder shrug intact bilaterally. XII - Tongue  protrusion intact.  Motor Strength - The patient's strength was normal in all extremities except diminished  fine finger movements on rightt.  Bulk was normal and fasciculations were absent.   Motor Tone - Muscle tone was assessed at the neck and appendages and was normal.  Reflexes - The patient's reflexes were 1+ in all extremities and she had no pathological reflexes.  Sensory - Light touch, temperature/pinprick were assessed and were symmetrical.    Coordination - The patient had normal movements in the hands with no ataxia or dysmetria.  Tremor was absent.  Gait and Station - deferred  Discharge Diet   Diet heart healthy/carb modified Room service appropriate? Yes; Fluid consistency: Thin liquids   Discharge instructions to the patient: 1. Avoid sugar, sweets and soft drinks (unless diet) 2. Increase activity gradually as tolerated. 3. Avoid any strenuous activity for the next couple of weeks.  4. If possible monitor your blood pressure at home, keep a record, and bring it to your next primary care appointment. 5. Outpatient physical therapy will be arranged. 6. Please try to stop smoking.   DISCHARGE PLAN  Disposition:  Discharged to home  No antithrombotic for secondary stroke prevention secondary to hemorrhage.  Ongoing risk factor control by Primary Care Physician at time of discharge  Follow-up Redmond School, MD in 1 week for evaluation of hyperglycemia / diabetes.  Follow-up with Dr. Rosalin Hawking [facility] in 6 weeks, office to schedule an appointment.  45 minutes were spent preparing discharge.  Mikey Bussing PA-C [facility] Pager [phone] 10/27/2016, 3:44 PM I have personally examined this patient, reviewed notes, independently viewed imaging studies, participated in medical decision making and plan of care.ROS completed by me personally and pertinent positives fully documented  I have made any additions or clarifications directly to  the above note. Agree with note above.    Antony Contras, MD Medical Director [facility] Pager: [phone] 10/29/2016 2:16 PM

## 2016-10-27 NOTE — Progress Notes (Signed)
  Speech Language Pathology Treatment: Cognitive-Linquistic  Patient Details Name: Tamara Thompson MRN: [id number] DOB: 02-10-1951 Today's Date: 10/27/2016 Time: 1020-1040 SLP Time Calculation (min) (ACUTE ONLY): 20 min  Assessment / Plan / Recommendation Clinical Impression  Patient seen to address language and cognitive goals. Patient stated that yesterday she had "confusion" but that she feels close to baseline now. Main issue she reported currently is continuation of headache and that she gets tired after taking aspiration and has been "sleeping a lot". Patient was oriented to time/place/situation and stated that MD told her she could likely discharge "around 12 today". She was able to recall and describe recent medical interventions, tests, as well as therapy sessions and told clinician, "I had...what's it called...a stroke" when asked why she was here in the hospital. Patient appeared a little tired, but did not present with significant language or cognitive errors as had been seen on previous days. SLP noticed that patient had a regular tray of food and she stated that change happened last night or this morning. She denied having any further nausea or vomitting.   HPI HPI: Pt is a51 y.o.femalewith PMH significant for anemia who presented with acute onsent of R weakness and slurred speech, who was found to have acute L BG ICH.      SLP Plan  Continue with current plan of care       Recommendations    If patient does not discharge today, may benefit from one more speech-language session.                Follow up Recommendations: None Plan: Continue with current plan of care       Sonia Baller, [place], CCC-SLP 10/27/16 4:03 PM

## 2016-10-27 NOTE — Care Management Note (Signed)
Case Management Note  Patient Details  Name: Tamara Thompson MRN: [id number] Date of Birth: Dec 18, 1950  Subjective/Objective:                  ICH Action/Plan: Discharge planning Expected Discharge Date:  10/27/16               Expected Discharge Plan:  Home/Self Care  In-House Referral:     Discharge planning Services  CM Consult  Post Acute Care Choice:    Choice offered to:  Patient  DME Arranged:  N/A DME Agency:  NA  HH Arranged:  NA HH Agency:  NA  Status of Service:  Completed, signed off  If discussed at [place] of Stay Meetings, dates discussed:    Additional Comments: CM received call from RN to please arrange for outpt PT.  Cm faxed facesheet, H&P, DC summary, PT eval, and CM consult to [place]. [facility] will call pt on Monday to schedule a follow up appointment.  Contact information placed on pt's discharge instructions.  No other CM needs were communicated. Dellie Catholic, RN 10/27/2016, 4:11 PM

## 2016-10-27 NOTE — Discharge Instructions (Signed)
1. Avoid sugar, sweets and soft drinks (unless diet) 2. Increase activity gradually as tolerated. 3. Avoid any strenuous activity for the next couple of weeks.  4. If possible monitor your blood pressure at home, keep a record, and bring it to your next primary care appointment. 5. Outpatient physical therapy will be arranged. 6. Please try to stop smoking.

## 2016-10-27 NOTE — Progress Notes (Signed)
Pt being discharged per orders from MD. Pt and family educated on discharge instructions. Pt and family verbalized understanding of instructions. All questions and concerns were addressed. Pt's IV's were removed prior to discharge. Pt exited hospital via wheelchair.

## 2016-10-29 ENCOUNTER — Ambulatory Visit (INDEPENDENT_AMBULATORY_CARE_PROVIDER_SITE_OTHER): Payer: Medicare Other | Admitting: Otolaryngology

## 2016-10-29 LAB — GLUCOSE, CAPILLARY: Glucose-Capillary: 136 mg/dL — ABNORMAL HIGH (ref 65–99)

## 2016-10-31 ENCOUNTER — Other Ambulatory Visit: Payer: Self-pay

## 2016-10-31 NOTE — Patient Outreach (Signed)
[facility]) Care Management  10/31/2016  Tamara Thompson Jul 28, 1950 [id number]  EMMI: Stroke Referral date: 10/30/09 Referral source: EMMI stroke red alert Referral reason: Filled prescriptions: NO Day # 1  Telephone call to patient regarding EMMI stroke red alert. HIPAA verified with patient. Discussed EMMI stroke program with patient.  Patient reports she was given a prescription for a statin medication and she is allergic to statins. Patient states she has an appointment with her primary MD on tomorrow and she will discuss this with him.  Patient reports she has her other medications. RNCM advised patient to take her medications as prescribed.  Patient states she has transportation to her appointments and good family support. Patient states she is suppose to have outpatient rehab but has not heard from the rehab facility. RNCM advised patient to call the rehab facility today and set up appointment. RNCM advised patient to talk with her primary MD regarding follow up with neurologist due to recent stroke. RNCM informed patient her discharge summary recommended follow up with Dr. Erlinda Hong, neurologist. Patient states she wants to discuss this with her primary MD.  Patient reports she feels tired often. RNCM advised patient to rest as much as possible.  Informed patient this is part of stroke recovery.  RNCM reviewed signs/ symptoms of stroke and advised patient to call 911 for stroke like symptoms. RNCM advised patient to follow up with her doctor for non emergent symptoms. Patient verbalized understanding.  RNCM advised patient to notify MD of any changes in condition prior to scheduled appointment. RNCM provided contact name and number: [phone] and 24 hour nurse advise line [phone].   Patient denies any further concerns at this time.    PLAN: RNCM will refer patient to care management assistant to close due to patient being assessed and having no further needs. RNCM  will notify patients primary MD of closure.   Quinn Plowman RN,BSN,CCM [facility] Telephonic  [phone]

## 2016-11-01 LAB — ECHOCARDIOGRAM COMPLETE
HEIGHTINCHES: 66 in
Weight: 2977.09 oz

## 2016-11-02 DIAGNOSIS — Z6830 Body mass index (BMI) 30.0-30.9, adult: Secondary | ICD-10-CM | POA: Diagnosis not present

## 2016-11-02 DIAGNOSIS — E1165 Type 2 diabetes mellitus with hyperglycemia: Secondary | ICD-10-CM | POA: Diagnosis not present

## 2016-11-02 DIAGNOSIS — E782 Mixed hyperlipidemia: Secondary | ICD-10-CM | POA: Diagnosis not present

## 2016-11-02 DIAGNOSIS — E6609 Other obesity due to excess calories: Secondary | ICD-10-CM | POA: Diagnosis not present

## 2016-11-02 DIAGNOSIS — I619 Nontraumatic intracerebral hemorrhage, unspecified: Secondary | ICD-10-CM | POA: Diagnosis not present

## 2016-11-02 DIAGNOSIS — I610 Nontraumatic intracerebral hemorrhage in hemisphere, subcortical: Secondary | ICD-10-CM | POA: Diagnosis not present

## 2016-11-21 ENCOUNTER — Encounter: Payer: Self-pay | Admitting: Gastroenterology

## 2016-11-21 ENCOUNTER — Ambulatory Visit (INDEPENDENT_AMBULATORY_CARE_PROVIDER_SITE_OTHER): Payer: Medicare Other | Admitting: Gastroenterology

## 2016-11-21 DIAGNOSIS — Z860100 Personal history of colon polyps, unspecified: Secondary | ICD-10-CM | POA: Insufficient documentation

## 2016-11-21 DIAGNOSIS — R7989 Other specified abnormal findings of blood chemistry: Secondary | ICD-10-CM | POA: Insufficient documentation

## 2016-11-21 DIAGNOSIS — R945 Abnormal results of liver function studies: Secondary | ICD-10-CM

## 2016-11-21 NOTE — Patient Instructions (Signed)
I will talk to Dr. Oneida Alar about where the colonoscopy needs to be.  We are repeating your liver numbers in 2 months!  Further recommendations to follow!

## 2016-11-21 NOTE — Progress Notes (Signed)
Primary Care Physician:  Redmond School, MD Primary Gastroenterologist:  Dr. Oneida Alar   Chief Complaint  Patient presents with  . Colonoscopy    3 yr recall; had stroke 10/25/16    HPI:   Tamara Thompson is a 66 y.o. female presenting today to schedule surveillance colonoscopy due to history of multiple colon polyps in 2015, sessile serrated adenoma.   Recently discharge October 25, 2016 from hospital after admission for acute stroke. She has an upcoming appt with neurology Sept 19th. She reports no physical deficits after stroke. Denies any rectal bleeding, abdominal pain. Has history of fatty liver.  While inpatient, ALT 107, AST 49 in setting of acute illness.   Past Medical History:  Diagnosis Date  . Anemia   . Bleeding from the nose   . Deviated septum   . Kidney stones   . Medical history non-contributory     Past Surgical History:  Procedure Laterality Date  . ANKLE SURGERY  2010   left  . CESAREAN SECTION    . COLONOSCOPY N/A 12/14/2013   Dr. Oneida Alar: 10 colon polyps removed, ascending colon sessile serrated adenoma, hyperplastic polyps. Moderate diverticulosis throughout colon. Left colon redundant  . NASAL ENDOSCOPY WITH EPISTAXIS CONTROL Bilateral 02/07/2015   Procedure: BILATERAL NASAL ENDOSCOPIC CAUTERIZATION;  Surgeon: Leta Baptist, MD;  Location: [place];  Service: ENT;  Laterality: Bilateral;  . NASAL SEPTOPLASTY W/ TURBINOPLASTY Bilateral 02/02/2014   Procedure: RIGHT NASAL SEPTOPLASTY WITH TURBINATE REDUCTION;  Surgeon: Ascencion Dike, MD;  Location: [place];  Service: ENT;  Laterality: Bilateral;  . OVARIAN CYST SURGERY    . POLYPECTOMY Right 02/02/2014   Procedure: RIGHT POLYPECTOMY NASAL;  Surgeon: Ascencion Dike, MD;  Location: [place];  Service: ENT;  Laterality: Right;  . Surgery of jaw and cheek fracture      Current Outpatient Prescriptions  Medication Sig Dispense Refill  . cholecalciferol (VITAMIN D) 400  units TABS tablet Take 2,000 Units by mouth.    . Coenzyme Q10 (CO Q 10) 100 MG CAPS Take 1 capsule by mouth daily.    Marland Kitchen ezetimibe (ZETIA) 10 MG tablet Take 10 mg by mouth daily.    . ferrous sulfate 325 (65 FE) MG tablet Take 325 mg by mouth 3 (three) times daily with meals.    Javier Docker Oil 300 MG CAPS Take by mouth daily.    . metFORMIN (GLUCOPHAGE) 500 MG tablet Take 500 mg by mouth 2 (two) times daily.     No current facility-administered medications for this visit.     Allergies as of 11/21/2016 - Review Complete 11/21/2016  Allergen Reaction Noted  . Penicillins  02/28/2012  . Percocet [oxycodone-acetaminophen] Rash 01/28/2015    Family History  Problem Relation Age of Onset  . Heart disease Mother   . Cancer Father        lung  . Stroke Father   . Colon cancer Neg Hx     Social History   Social History  . Marital status: Married    Spouse name: N/A  . Number of children: N/A  . Years of education: N/A   Occupational History  . housewife    Social History Main Topics  . Smoking status: Former Smoker    Packs/day: 1.25    Years: 50.00    Types: Cigarettes    Quit date: 01/27/2010  . Smokeless tobacco: Never Used  . Alcohol use Yes  Comment: rarely, not in past few months  . Drug use: No  . Sexual activity: Not Currently    Birth control/ protection: Post-menopausal   Other Topics Concern  . Not on file   Social History Narrative  . No narrative on file    Review of Systems: As mentioned in HPI   Physical Exam: BP (!) 154/94   Pulse 90   Temp 98.2 F (36.8 C) (Oral)   Ht 5' 5"  (1.651 m)   Wt 176 lb 3.2 oz (79.9 kg)   BMI 29.32 kg/m  General:   Alert and oriented. Pleasant and cooperative. Well-nourished and well-developed.  Head:  Normocephalic and atraumatic. Eyes:  Without icterus, sclera clear and conjunctiva pink.  Ears:  Normal auditory acuity. Nose:  No deformity, discharge,  or lesions. Mouth:  No deformity or lesions, oral mucosa  pink.  Lungs:  Clear to auscultation bilaterally. No wheezes, rales, or rhonchi. No distress.  Heart:  S1, S2 present with systolic murmur  Abdomen:  +BS, soft, non-tender and non-distended. No HSM noted. No guarding or rebound. No masses appreciated.  Rectal:  Deferred  Msk:  Symmetrical without gross deformities. Normal posture. Extremities:  Without  edema. Neurologic:  Alert and  oriented x4 Psych:  Alert and cooperative. Normal mood and affect.  Lab Results  Component Value Date   ALT 107 (H) 10/26/2016   AST 49 (H) 10/26/2016   ALKPHOS 56 10/26/2016   BILITOT 0.5 10/26/2016

## 2016-11-22 ENCOUNTER — Other Ambulatory Visit (HOSPITAL_COMMUNITY): Payer: Self-pay | Admitting: Family Medicine

## 2016-11-22 ENCOUNTER — Other Ambulatory Visit: Payer: Self-pay

## 2016-11-22 DIAGNOSIS — E2839 Other primary ovarian failure: Secondary | ICD-10-CM

## 2016-11-22 DIAGNOSIS — R945 Abnormal results of liver function studies: Principal | ICD-10-CM

## 2016-11-22 DIAGNOSIS — R7989 Other specified abnormal findings of blood chemistry: Secondary | ICD-10-CM

## 2016-11-23 NOTE — Assessment & Plan Note (Signed)
Elevated transaminases in setting of acute illness while hospitalized for stroke. Recheck in 2 months.

## 2016-11-26 ENCOUNTER — Encounter: Payer: Self-pay | Admitting: Gastroenterology

## 2016-11-26 NOTE — Assessment & Plan Note (Signed)
67 year old female with history of sessile serrated adenoma, multiple polyps in 2015 and due for surveillance now with overtube. As of note, she was recently discharged from the hospital approximately a month ago after acute stroke. She will be seeing neurology Sept 19th: will hold on scheduling elective surveillance until this is completed. Discussed risks and benefits of colonoscopy in near future. Further recommendations after neurology appointment.

## 2016-11-27 NOTE — Progress Notes (Signed)
cc'ed to pcp °

## 2016-11-28 ENCOUNTER — Other Ambulatory Visit (HOSPITAL_COMMUNITY): Payer: Medicare Other

## 2016-12-03 ENCOUNTER — Encounter: Payer: Self-pay | Admitting: Gastroenterology

## 2016-12-05 ENCOUNTER — Ambulatory Visit (HOSPITAL_COMMUNITY)
Admission: RE | Admit: 2016-12-05 | Discharge: 2016-12-05 | Disposition: A | Discharge status: Home / Self Care | Payer: Medicare Other | Source: Ambulatory Visit | Attending: Family Medicine | Admitting: Family Medicine

## 2016-12-05 DIAGNOSIS — Z780 Asymptomatic menopausal state: Secondary | ICD-10-CM | POA: Diagnosis not present

## 2016-12-05 DIAGNOSIS — E2839 Other primary ovarian failure: Secondary | ICD-10-CM | POA: Diagnosis not present

## 2016-12-05 DIAGNOSIS — M85851 Other specified disorders of bone density and structure, right thigh: Secondary | ICD-10-CM | POA: Diagnosis not present

## 2016-12-05 DIAGNOSIS — M8588 Other specified disorders of bone density and structure, other site: Secondary | ICD-10-CM | POA: Diagnosis not present

## 2016-12-07 NOTE — Progress Notes (Signed)
REVIEWED. NEEDS TCS. HOLD IRO FOR 7 DAYS.

## 2016-12-27 ENCOUNTER — Other Ambulatory Visit: Payer: Self-pay

## 2016-12-27 DIAGNOSIS — R7989 Other specified abnormal findings of blood chemistry: Secondary | ICD-10-CM

## 2016-12-27 DIAGNOSIS — R945 Abnormal results of liver function studies: Principal | ICD-10-CM

## 2017-01-09 ENCOUNTER — Ambulatory Visit (INDEPENDENT_AMBULATORY_CARE_PROVIDER_SITE_OTHER): Payer: Medicare Other | Admitting: Neurology

## 2017-01-09 ENCOUNTER — Telehealth (HOSPITAL_COMMUNITY): Payer: Self-pay | Admitting: Radiology

## 2017-01-09 ENCOUNTER — Ambulatory Visit: Payer: Medicare Other | Admitting: Neurology

## 2017-01-09 ENCOUNTER — Encounter: Payer: Self-pay | Admitting: Neurology

## 2017-01-09 VITALS — BP 134/87 | HR 89 | Wt 176.4 lb

## 2017-01-09 DIAGNOSIS — E1159 Type 2 diabetes mellitus with other circulatory complications: Secondary | ICD-10-CM

## 2017-01-09 DIAGNOSIS — E782 Mixed hyperlipidemia: Secondary | ICD-10-CM

## 2017-01-09 DIAGNOSIS — I610 Nontraumatic intracerebral hemorrhage in hemisphere, subcortical: Secondary | ICD-10-CM | POA: Diagnosis not present

## 2017-01-09 DIAGNOSIS — Z87891 Personal history of nicotine dependence: Secondary | ICD-10-CM | POA: Diagnosis not present

## 2017-01-09 DIAGNOSIS — I671 Cerebral aneurysm, nonruptured: Secondary | ICD-10-CM

## 2017-01-09 DIAGNOSIS — E119 Type 2 diabetes mellitus without complications: Secondary | ICD-10-CM | POA: Insufficient documentation

## 2017-01-09 NOTE — Patient Instructions (Signed)
-   continue zetia and metformin for cholesterol and diabetic control - will refer to Dr. Estanislado Pandy for angiogram for aneurysm follow up - check BP and glucose at home - Follow up with your primary care physician for stroke risk factor modification. Recommend maintain blood pressure goal <130/80, diabetes with hemoglobin A1c goal below 7.0% and lipids with LDL cholesterol goal below 70 mg/dL.  - diabetic diet and regular exercise - follow up in 3 months.

## 2017-01-09 NOTE — Progress Notes (Signed)
STROKE NEUROLOGY FOLLOW UP NOTE  NAME: Tamara Thompson DOB: [birth year]  REASON FOR VISIT: stroke follow up HISTORY FROM: pt and chart  Today we had the pleasure of seeing Tamara Thompson in follow-up at our [facility]rology [facility]. Pt was accompanied by daughter.   History Summary Ms.Tamara Thompson a 66 y.o.femalewith history of anemia on iron pills, epistaxis due to deviated septum s/p cauterization, and former smoker admitted on 10/25/16 for acute onset of slurred speech, right arm weakness. CT head showed left BG small ICH. MRI confirmed left inferior BG hematoma. CTA head and neck showed stable hematoma, no ACOM aneurysm but distal right A2 segment 4x5 mm saccular aneurysm or small AVM. TTE EF 55-60%, LDL 159 and A1C 7.2. No Hx of HTN and BP well control during hospitalization, but found to have high glucose 140-200 range, diagnosed with DM. She was discharged lipitor and outpt PT/OT.  Interval History During the interval time, the patient has been doing well. Neuro back to baseline. Pt is on metformin now for DM, at home glucose 130s. Not tolerating statins in the past due to rashes, now on zetia. No hx of HTN, today BP 134/87. Reviewed CT and MRI images with her, and she is willing to do angiogram to evaluate aneurysm. Discussed with Dr. Estanislado Pandy.    REVIEW OF SYSTEMS: Full 14 system review of systems performed and notable only for those listed below and in HPI above, all others are negative:  Constitutional:   Cardiovascular:  Ear/Nose/Throat:   Skin:  Eyes:   Respiratory:   Gastroitestinal:   Genitourinary:  Hematology/Lymphatic:   Endocrine:  Musculoskeletal:   Allergy/Immunology:   Neurological:   Psychiatric:  Sleep: frequent waking  The following represents the patient's updated allergies and side effects list: Allergies  Allergen Reactions  . Penicillins     Unknown Reaction  . Percocet [Oxycodone-Acetaminophen] Rash    The neurologically relevant items on the  patient's problem list were reviewed on today's visit.  Neurologic Examination  A problem focused neurological exam (12 or more points of the single system neurologic examination, vital signs counts as 1 point, cranial nerves count for 8 points) was performed.  Blood pressure 134/87, pulse 89, weight 176 lb 6.4 oz (80 kg).  General - Well nourished, well developed, in no apparent distress.  Ophthalmologic - Sharp disc margins OU.   Cardiovascular - Regular rate and rhythm with no murmur.  Mental Status -  Level of arousal and orientation to time, place, and person were intact. Language including expression, naming, repetition, comprehension was assessed and found intact. Attention span and concentration were normal. Fund of Knowledge was assessed and was intact.  Cranial Nerves II - XII - II - Visual field intact OU. III, IV, VI - Extraocular movements intact. V - Facial sensation intact bilaterally. VII - Facial movement intact bilaterally. VIII - Hearing & vestibular intact bilaterally. X - Palate elevates symmetrically. XI - Chin turning & shoulder shrug intact bilaterally. XII - Tongue protrusion intact.  Motor Strength - The patient's strength was normal in all extremities and pronator drift was absent.  Bulk was normal and fasciculations were absent.   Motor Tone - Muscle tone was assessed at the neck and appendages and was normal.  Reflexes - The patient's reflexes were 1+ in all extremities and she had no pathological reflexes.  Sensory - Light touch, temperature/pinprick, vibration and proprioception, and Romberg testing were assessed and were normal.    Coordination - The patient had normal movements in  the hands and feet with no ataxia or dysmetria.  Tremor was absent.  Gait and Station - The patient's transfers, posture, gait, station, and turns were observed as normal.   Functional score  mRS = 0   0 - No symptoms.   1 - No significant disability. Able to  carry out all usual activities, despite some symptoms.   2 - Slight disability. Able to look after own affairs without assistance, but unable to carry out all previous activities.   3 - Moderate disability. Requires some help, but able to walk unassisted.   4 - Moderately severe disability. Unable to attend to own bodily needs without assistance, and unable to walk unassisted.   5 - Severe disability. Requires constant nursing care and attention, bedridden, incontinent.   6 - Dead.   NIH Stroke Scale = 0   Data reviewed: I personally reviewed the images and agree with the radiology interpretations.  Ct Angio Head and neck W and Wo Contrast 10/26/2016 1. Left inferior basal ganglia hemorrhage appears stable since the MRI yesterday, with NO CTA spot sign. And although the hemorrhage is in proximity to the left ICA terminus, no ICA aneurysm or regional vascular malformation is identified (see # 2).  2. There is a distal right ACA distal A2 segment 4 x 5 mm saccular aneurysm or small vascular malformation. This was occult on the MRI yesterday, favoring either an aneurysm or a slow flow malformation.  3. However, the anterior communicating artery is normal, no A-comm aneurysm.  4. Minimal atherosclerosis. No stenosis.  5. No acute findings in the neck or visible chest.   MRI  1. Moderate to severely motion degraded examination. 2. Acute LEFT inferior basal ganglia hematoma.  MRA  1. No emergent large vessel occlusion or severe stenosis on this moderately motion degraded examination. 2. 3 mm ACOM and 2 mm LEFT carotid terminus aneurysms versus motion artifact. Recommend CTA HEAD.   Ct Head Code Stroke Wo Contrast 10/25/2016 1. Acute LEFT basal ganglia 1.6 x 1.7 cm hematoma. No significant mass effect.  2. ASPECTS is 10.  TTE - Left ventricle: The cavity size was normal. Wall thickness was   normal. Systolic function was normal. The estimated ejection   fraction was in the range  of 55% to 60%. Wall motion was normal;   there were no regional wall motion abnormalities. Doppler   parameters are consistent with abnormal left ventricular   relaxation (grade 1 diastolic dysfunction). Impressions: - Normal LV systolic function; mild diastolic dysfunction; mild TR.  Component     Latest Ref Rng & Units 10/25/2016 10/26/2016  Total Protein     6.5 - 8.1 g/dL  6.2 (L)  Albumin     3.5 - 5.0 g/dL  3.4 (L)  AST     15 - 41 U/L  49 (H)  ALT     14 - 54 U/L  107 (H)  Alkaline Phosphatase     38 - 126 U/L  56  Total Bilirubin     0.3 - 1.2 mg/dL  0.5  Bilirubin, Direct     0.1 - 0.5 mg/dL  <0.1 (L)  Indirect Bilirubin     0.3 - 0.9 mg/dL  NOT CALCULATED  Cholesterol     0 - 200 mg/dL 248 (H)   Triglycerides     <150 mg/dL 208 (H)   HDL Cholesterol     >40 mg/dL 47   Total CHOL/HDL Ratio     RATIO 5.3  VLDL     0 - 40 mg/dL 42 (H)   LDL (calc)     0 - 99 mg/dL 159 (H)   Hemoglobin A1C     4.8 - 5.6 %  7.2 (H)  Mean Plasma Glucose     mg/dL  160    Assessment: As you may recall, she is a 66 y.o. Caucasian female with PMH of anemia, epistaxis due to deviated septum s/p cauterization, and former smoker admitted on 10/25/16 for left BG small ICH on CT. MRI confirmed left inferior BG hematoma. MRA concerning for ACOM aneurysm but CTA head and neck showed stable hematoma, no ACOM aneurysm but distal right A2 segment 4x5 mm saccular aneurysm or small AVM. TTE EF 55-60%, LDL 159 and A1C 7.2. No Hx of HTN and BP well control during hospitalization, but found to have high glucose 140-200 range, diagnosed with DM. She was discharged lipitor and outpt PT/OT. During the interval time, the patient back to baseline. Pt is on metformin now for DM. Not tolerating statins in the past due to rashes, now on zetia. Will do angiogram to evaluate aneurysm. Discussed with Dr. Estanislado Pandy.    Plan:  - continue zetia and metformin for cholesterol and diabetic control - will refer to Dr.  Estanislado Pandy for angiogram for aneurysm follow up - check BP and glucose at home - Follow up with your primary care physician for stroke risk factor modification. Recommend maintain blood pressure goal <130/80, diabetes with hemoglobin A1c goal below 7.0% and lipids with LDL cholesterol goal below 70 mg/dL.  - diabetic diet and regular exercise - follow up in 3 months.   I spent more than 25 minutes of face to face time with the patient. Greater than 50% of time was spent in counseling and coordination of care. We reviewed neuro images, discussed DM and HLD management, and aneurysm work up with angiogram.  No orders of the defined types were placed in this encounter.   Meds ordered this encounter  Medications  . atorvastatin (LIPITOR) 40 MG tablet    Patient Instructions  - continue zetia and metformin for cholesterol and diabetic control - will refer to Dr. Estanislado Pandy for angiogram for aneurysm follow up - check BP and glucose at home - Follow up with your primary care physician for stroke risk factor modification. Recommend maintain blood pressure goal <130/80, diabetes with hemoglobin A1c goal below 7.0% and lipids with LDL cholesterol goal below 70 mg/dL.  - diabetic diet and regular exercise - follow up in 3 months.    Tamara Hawking, MD PhD [facility] [address] [place], [place] [address] [phone]

## 2017-01-09 NOTE — Telephone Encounter (Signed)
Called and spoke to pt's husband. He said to call back tomorrow morning to schedule her consult with Dr. Estanislado Pandy for her aneurysm. JM

## 2017-01-10 ENCOUNTER — Other Ambulatory Visit (HOSPITAL_COMMUNITY): Payer: Self-pay | Admitting: Interventional Radiology

## 2017-01-10 DIAGNOSIS — I729 Aneurysm of unspecified site: Secondary | ICD-10-CM

## 2017-01-17 ENCOUNTER — Ambulatory Visit (HOSPITAL_COMMUNITY)
Admission: RE | Admit: 2017-01-17 | Discharge: 2017-01-17 | Disposition: A | Discharge status: Home / Self Care | Payer: Medicare Other | Source: Ambulatory Visit | Attending: Interventional Radiology | Admitting: Interventional Radiology

## 2017-01-17 DIAGNOSIS — I729 Aneurysm of unspecified site: Secondary | ICD-10-CM

## 2017-01-17 DIAGNOSIS — I671 Cerebral aneurysm, nonruptured: Secondary | ICD-10-CM | POA: Diagnosis not present

## 2017-01-17 HISTORY — PX: IR RADIOLOGIST EVAL & MGMT: IMG5224

## 2017-01-18 ENCOUNTER — Encounter (HOSPITAL_COMMUNITY): Payer: Self-pay | Admitting: Interventional Radiology

## 2017-01-18 DIAGNOSIS — R7989 Other specified abnormal findings of blood chemistry: Secondary | ICD-10-CM | POA: Diagnosis not present

## 2017-01-18 LAB — HEPATIC FUNCTION PANEL
AG Ratio: 1.4 (calc) (ref 1.0–2.5)
ALBUMIN MSPROF: 4.1 g/dL (ref 3.6–5.1)
ALT: 92 U/L — ABNORMAL HIGH (ref 6–29)
AST: 45 U/L — ABNORMAL HIGH (ref 10–35)
Alkaline phosphatase (APISO): 64 U/L (ref 33–130)
BILIRUBIN DIRECT: 0.1 mg/dL (ref 0.0–0.2)
BILIRUBIN INDIRECT: 0.2 mg/dL (ref 0.2–1.2)
BILIRUBIN TOTAL: 0.3 mg/dL (ref 0.2–1.2)
GLOBULIN: 2.9 g/dL (ref 1.9–3.7)
Total Protein: 7 g/dL (ref 6.1–8.1)

## 2017-01-22 ENCOUNTER — Other Ambulatory Visit (HOSPITAL_COMMUNITY): Payer: Self-pay | Admitting: Interventional Radiology

## 2017-01-22 DIAGNOSIS — I639 Cerebral infarction, unspecified: Secondary | ICD-10-CM

## 2017-01-22 DIAGNOSIS — I729 Aneurysm of unspecified site: Secondary | ICD-10-CM

## 2017-01-24 DIAGNOSIS — Z23 Encounter for immunization: Secondary | ICD-10-CM | POA: Diagnosis not present

## 2017-01-28 ENCOUNTER — Other Ambulatory Visit: Payer: Self-pay | Admitting: Radiology

## 2017-01-29 ENCOUNTER — Other Ambulatory Visit (HOSPITAL_COMMUNITY): Payer: Self-pay | Admitting: Interventional Radiology

## 2017-01-29 ENCOUNTER — Ambulatory Visit (HOSPITAL_COMMUNITY)
Admission: RE | Admit: 2017-01-29 | Discharge: 2017-01-29 | Disposition: A | Discharge status: Home / Self Care | Payer: Medicare Other | Source: Ambulatory Visit | Attending: Interventional Radiology | Admitting: Interventional Radiology

## 2017-01-29 ENCOUNTER — Encounter (HOSPITAL_COMMUNITY): Payer: Self-pay

## 2017-01-29 DIAGNOSIS — Z880 Allergy status to penicillin: Secondary | ICD-10-CM | POA: Insufficient documentation

## 2017-01-29 DIAGNOSIS — E119 Type 2 diabetes mellitus without complications: Secondary | ICD-10-CM | POA: Insufficient documentation

## 2017-01-29 DIAGNOSIS — H93A1 Pulsatile tinnitus, right ear: Secondary | ICD-10-CM | POA: Insufficient documentation

## 2017-01-29 DIAGNOSIS — R930 Abnormal findings on diagnostic imaging of skull and head, not elsewhere classified: Secondary | ICD-10-CM | POA: Diagnosis not present

## 2017-01-29 DIAGNOSIS — Z885 Allergy status to narcotic agent status: Secondary | ICD-10-CM | POA: Diagnosis not present

## 2017-01-29 DIAGNOSIS — Z8673 Personal history of transient ischemic attack (TIA), and cerebral infarction without residual deficits: Secondary | ICD-10-CM | POA: Insufficient documentation

## 2017-01-29 DIAGNOSIS — Z87891 Personal history of nicotine dependence: Secondary | ICD-10-CM | POA: Diagnosis not present

## 2017-01-29 DIAGNOSIS — Z7984 Long term (current) use of oral hypoglycemic drugs: Secondary | ICD-10-CM | POA: Diagnosis not present

## 2017-01-29 DIAGNOSIS — Q282 Arteriovenous malformation of cerebral vessels: Secondary | ICD-10-CM | POA: Insufficient documentation

## 2017-01-29 DIAGNOSIS — I729 Aneurysm of unspecified site: Secondary | ICD-10-CM

## 2017-01-29 DIAGNOSIS — I671 Cerebral aneurysm, nonruptured: Secondary | ICD-10-CM | POA: Diagnosis not present

## 2017-01-29 DIAGNOSIS — I639 Cerebral infarction, unspecified: Secondary | ICD-10-CM

## 2017-01-29 HISTORY — PX: IR ANGIO VERTEBRAL SEL VERTEBRAL BILAT MOD SED: IMG5369

## 2017-01-29 HISTORY — PX: IR ANGIO INTRA EXTRACRAN SEL COM CAROTID INNOMINATE BILAT MOD SED: IMG5360

## 2017-01-29 LAB — BASIC METABOLIC PANEL
ANION GAP: 10 (ref 5–15)
BUN: 12 mg/dL (ref 6–20)
CALCIUM: 9.1 mg/dL (ref 8.9–10.3)
CO2: 24 mmol/L (ref 22–32)
Chloride: 105 mmol/L (ref 101–111)
Creatinine, Ser: 0.71 mg/dL (ref 0.44–1.00)
GFR calc non Af Amer: >60 mL/min (ref >60)
Glucose, Bld: 131 mg/dL — ABNORMAL HIGH (ref 65–99)
POTASSIUM: 4 mmol/L (ref 3.5–5.1)
Sodium: 139 mmol/L (ref 135–145)

## 2017-01-29 LAB — CBC
HEMATOCRIT: 40.9 % (ref 36.0–46.0)
HEMOGLOBIN: 13.3 g/dL (ref 12.0–15.0)
MCH: 29.2 pg (ref 26.0–34.0)
MCHC: 32.5 g/dL (ref 30.0–36.0)
MCV: 89.7 fL (ref 78.0–100.0)
Platelets: 280 10*3/uL (ref 150–400)
RBC: 4.56 MIL/uL (ref 3.87–5.11)
RDW: 14.4 % (ref 11.5–15.5)
WBC: 5.2 10*3/uL (ref 4.0–10.5)

## 2017-01-29 LAB — PROTIME-INR
INR: 1
PROTHROMBIN TIME: 13.1 s (ref 11.4–15.2)

## 2017-01-29 LAB — APTT: aPTT: 28 seconds (ref 24–36)

## 2017-01-29 LAB — GLUCOSE, CAPILLARY
GLUCOSE-CAPILLARY: 129 mg/dL — AB (ref 65–99)
GLUCOSE-CAPILLARY: 96 mg/dL (ref 65–99)

## 2017-01-29 MED ORDER — IOPAMIDOL (ISOVUE-300) INJECTION 61%
INTRAVENOUS | Status: AC
  Filled 2017-01-29: qty 50

## 2017-01-29 MED ORDER — MIDAZOLAM HCL 2 MG/2ML IJ SOLN
INTRAMUSCULAR | Status: AC | PRN
  Administered 2017-01-29: 1 mg via INTRAVENOUS

## 2017-01-29 MED ORDER — FENTANYL CITRATE (PF) 100 MCG/2ML IJ SOLN
INTRAMUSCULAR | Status: AC | PRN
  Administered 2017-01-29 (×2): 25 ug via INTRAVENOUS

## 2017-01-29 MED ORDER — ACETAMINOPHEN 500 MG PO TABS
ORAL_TABLET | ORAL | Status: AC
  Filled 2017-01-29: qty 2

## 2017-01-29 MED ORDER — IOPAMIDOL (ISOVUE-300) INJECTION 61%
INTRAVENOUS | Status: AC
  Filled 2017-01-29: qty 150

## 2017-01-29 MED ORDER — ACETAMINOPHEN 500 MG PO TABS
1000.0000 mg | ORAL_TABLET | Freq: Once | ORAL | Status: AC
  Administered 2017-01-29: 1000 mg via ORAL

## 2017-01-29 MED ORDER — SODIUM CHLORIDE 0.9 % IV SOLN
Freq: Once | INTRAVENOUS | Status: AC
  Administered 2017-01-29: 10:00:00 via INTRAVENOUS

## 2017-01-29 MED ORDER — MIDAZOLAM HCL 2 MG/2ML IJ SOLN
INTRAMUSCULAR | Status: AC
  Filled 2017-01-29: qty 2

## 2017-01-29 MED ORDER — HEPARIN SODIUM (PORCINE) 1000 UNIT/ML IJ SOLN
INTRAMUSCULAR | Status: AC
  Filled 2017-01-29: qty 2

## 2017-01-29 MED ORDER — FENTANYL CITRATE (PF) 100 MCG/2ML IJ SOLN
INTRAMUSCULAR | Status: AC
  Filled 2017-01-29: qty 2

## 2017-01-29 MED ORDER — SODIUM CHLORIDE 0.9 % IV SOLN: INTRAVENOUS | Status: AC

## 2017-01-29 MED ORDER — HEPARIN SODIUM (PORCINE) 1000 UNIT/ML IJ SOLN
INTRAMUSCULAR | Status: AC | PRN
  Administered 2017-01-29: 1000 [IU] via INTRAVENOUS

## 2017-01-29 MED ORDER — LIDOCAINE HCL 1 % IJ SOLN
INTRAMUSCULAR | Status: AC
  Filled 2017-01-29: qty 20

## 2017-01-29 MED ORDER — LIDOCAINE HCL (PF) 1 % IJ SOLN
INTRAMUSCULAR | Status: AC | PRN
  Administered 2017-01-29: 20 mL

## 2017-01-29 NOTE — Sedation Documentation (Signed)
Patient is resting comfortably. 

## 2017-01-29 NOTE — Progress Notes (Signed)
Still with elevated transaminases. This needs further evaluation. She has been undergoing neuro evaluation. Let's get her in to see [place] in next few weeks. Need to arrange colonoscopy and follow-up on LFTs.

## 2017-01-29 NOTE — Discharge Instructions (Signed)
NO METFORMIN/GLUCOPHAGE FOR 2 DAYS   Cerebral Angiogram, Care After Refer to this sheet in the next few weeks. These instructions provide you with information on caring for yourself after your procedure. Your health care provider may also give you more specific instructions. Your treatment has been planned according to current medical practices, but problems sometimes occur. Call your health care provider if you have any problems or questions after your procedure. What can I expect after the procedure? After your procedure, it is typical to have the following:  Bruising at the catheter insertion site that usually fades within 1-2 weeks.  Blood collecting in the tissue (hematoma) that may be painful to the touch. It should usually decrease in size and tenderness within 1-2 weeks.  A mild headache.  Follow these instructions at home:  Take medicines only as directed by your health care provider.  You may shower 24-48 hours after the procedure or as directed by your health care provider. Remove the bandage (dressing) and gently wash the site with plain soap and water. Pat the area dry with a clean towel. Do not rub the site, because this may cause bleeding.  Do not take baths, swim, or use a hot tub until your health care provider approves.  Check your insertion site every day for redness, swelling, or drainage.  Do not apply powder or lotion to the site.  Do not lift over 10 lb (4.5 kg) for 5 days after your procedure or as directed by your health care provider.  Ask your health care provider when it is okay to: ? Return to work or school. ? Resume usual physical activities or sports. ? Resume sexual activity.  Do not drive home if you are discharged the same day as the procedure. Have someone else drive you.  You may drive 24 hours after the procedure unless otherwise instructed by your health care provider.  Do not operate machinery or power tools for 24 hours after the procedure  or as directed by your health care provider.  If your procedure was done as an outpatient procedure, which means that you went home the same day as your procedure, a responsible adult should be with you for the first 24 hours after you arrive home.  Keep all follow-up visits as directed by your health care provider. This is important. Contact a health care provider if:  You have a fever.  You have chills.  You have increased bleeding from the catheter insertion site. Hold pressure on the site. Get help right away if:  You have vision changes or loss of vision.  You have numbness or weakness on one side of your body.  You have difficulty talking, or you have slurred speech or cannot speak (aphasia).  You feel confused or have difficulty remembering.  You have unusual pain at the catheter insertion site.  You have redness, warmth, or swelling at the catheter insertion site.  You have drainage (other than a small amount of blood on the dressing) from the catheter insertion site.  The catheter insertion site is bleeding, and the bleeding does not stop after 30 minutes of holding steady pressure on the site. These symptoms may represent a serious problem that is an emergency. Do not wait to see if the symptoms will go away. Get medical help right away. Call your local emergency services (911 in [place] Do not drive yourself to the hospital. This information is not intended to replace advice given to you by your health care  provider. Make sure you discuss any questions you have with your health care provider. Document Released: 08/24/2013 Document Revised: 09/15/2015 Document Reviewed: 04/22/2013 Elsevier Interactive Patient Education  2017 [employer].

## 2017-01-29 NOTE — H&P (Signed)
Chief Complaint: Patient was seen in consultation today for serebral arteriogram at the request of Dr Erlinda Hong  Referring Physician(s): Dr Lavera Guise  Supervising Physician: Luanne Bras  Patient Status: [facility] - Out-pt  History of Present Illness: Tamara Thompson is a 66 y.o. female   Developed slurred speech and facial droop 10/2016 Work up revealed small hematoma CT Head: IMPRESSION: 1. Acute LEFT basal ganglia 1.6 x 1.7 cm hematoma. No significant mass effect.  Continued work up: CTA: IMPRESSION: 1. Left inferior basal ganglia hemorrhage appears stable since the MRI yesterday, with NO CTA spot sign. And although the hemorrhage is in proximity to the left ICA terminus, no ICA aneurysm or regional vascular malformation is identified (see # 2). 2. There is a distal right ACA distal A2 segment 4 x 5 mm saccular aneurysm or small vascular malformation. This was occult on the MRI yesterday, favoring either an aneurysm or a slow flow malformation. 3. However, the anterior communicating artery is normal, no A-comm aneurysm. 4. Minimal atherosclerosis.  No stenosis. 5. No acute findings in the neck or visible chest.  Referred to Dr Estanislado Pandy for evaluation  Consult performed 01/17/17 Now scheduled for cerebral arteriogram for accurate characterization  Past Medical History:  Diagnosis Date  . Anemia   . Bleeding from the nose   . Deviated septum   . Diabetes mellitus without complication ([place])   . Kidney stones   . Medical history non-contributory   . Stroke [facility])     Past Surgical History:  Procedure Laterality Date  . ANKLE SURGERY  2010   left  . CESAREAN SECTION    . COLONOSCOPY N/A 12/14/2013   Dr. Oneida Alar: 10 colon polyps removed, ascending colon sessile serrated adenoma, hyperplastic polyps. Moderate diverticulosis throughout colon. Left colon redundant  . IR RADIOLOGIST EVAL & MGMT  01/17/2017  . NASAL ENDOSCOPY WITH EPISTAXIS CONTROL Bilateral 02/07/2015   Procedure: BILATERAL NASAL ENDOSCOPIC CAUTERIZATION;  Surgeon: Leta Baptist, MD;  Location: [place];  Service: ENT;  Laterality: Bilateral;  . NASAL SEPTOPLASTY W/ TURBINOPLASTY Bilateral 02/02/2014   Procedure: RIGHT NASAL SEPTOPLASTY WITH TURBINATE REDUCTION;  Surgeon: Ascencion Dike, MD;  Location: [place];  Service: ENT;  Laterality: Bilateral;  . OVARIAN CYST SURGERY    . POLYPECTOMY Right 02/02/2014   Procedure: RIGHT POLYPECTOMY NASAL;  Surgeon: Ascencion Dike, MD;  Location: [place];  Service: ENT;  Laterality: Right;  . Surgery of jaw and cheek fracture      Allergies: Penicillins; Statins; and Percocet [oxycodone-acetaminophen]  Medications: Prior to Admission medications   Medication Sig Start Date End Date Taking? Authorizing Provider  Cholecalciferol 2000 units TABS Take 2,000 Units by mouth daily.    Yes [provider]  Coenzyme Q10 (CO Q 10) 100 MG CAPS Take 1 capsule by mouth daily.   Yes [provider]  ezetimibe (ZETIA) 10 MG tablet Take 10 mg by mouth daily. 11/02/16  Yes [provider]  ferrous sulfate 325 (65 FE) MG tablet Take 325 mg by mouth 3 (three) times daily with meals.   Yes [provider]  Javier Docker Oil 300 MG CAPS Take 1 capsule by mouth daily.    Yes [provider]  metFORMIN (GLUCOPHAGE) 500 MG tablet Take 500 mg by mouth 2 (two) times daily. 11/02/16  Yes [provider]     Family History  Problem Relation Age of Onset  . Heart disease Mother   . Cancer Father  lung  . Stroke Father   . Colon cancer Neg Hx     Social History   Social History  . Marital status: Married    Spouse name: N/A  . Number of children: N/A  . Years of education: N/A   Occupational History  . housewife    Social History Main Topics  . Smoking status: Former Smoker    Packs/day: 1.25    Years: 50.00    Types: Cigarettes    Quit date: 01/27/2010  . Smokeless  tobacco: Never Used  . Alcohol use Yes     Comment: rarely, not in past few months  . Drug use: No  . Sexual activity: Not Currently    Birth control/ protection: Post-menopausal   Other Topics Concern  . None   Social History Narrative  . None    Review of Systems: A 12 point ROS discussed and pertinent positives are indicated in the HPI above.  All other systems are negative.  Review of Systems  Constitutional: Negative for activity change, fatigue and fever.  HENT: Negative for tinnitus and trouble swallowing.   Eyes: Negative for visual disturbance.  Respiratory: Negative for cough and shortness of breath.   Cardiovascular: Negative for chest pain.  Gastrointestinal: Negative for abdominal pain.  Musculoskeletal: Negative for gait problem.  Neurological: Negative for dizziness, tremors, seizures, syncope, facial asymmetry, speech difficulty, weakness, light-headedness, numbness and headaches.  Psychiatric/Behavioral: Negative for behavioral problems and confusion.    Vital Signs: BP (!) 144/94   Pulse 83   Temp 97.9 F (36.6 C)   Resp 18   Ht 5' 5"  (1.651 m)   Wt 178 lb (80.7 kg)   SpO2 98%   BMI 29.62 kg/m   Physical Exam  Constitutional: She is oriented to person, place, and time. She appears well-nourished.  HENT:  Head: Atraumatic.  Facial droop resolved  Eyes: EOM are normal.  Neck: Neck supple.  Cardiovascular: Normal rate, regular rhythm and normal heart sounds.   Pulmonary/Chest: Effort normal and breath sounds normal.  Abdominal: Soft. Bowel sounds are normal. There is no tenderness.  Musculoskeletal: Normal range of motion.  Neurological: She is alert and oriented to person, place, and time.  Speech symptoms resolved   Skin: Skin is warm and dry.  Psychiatric: She has a normal mood and affect. Her behavior is normal. Judgment and thought content normal.  Nursing note and vitals reviewed.   Imaging: Ir Radiologist Eval & Mgmt  Result Date:  01/18/2017 EXAM: NEW PATIENT OFFICE VISIT CHIEF COMPLAINT: Recent discovery of two or possibly three intracranial aneurysms. Current Pain Level: 1-10 HISTORY OF PRESENT ILLNESS: The patient is a 66 year old right-handed lady who has been referred for evaluation of management of her recently discovered probable two or three intracranial aneurysms. The patient is accompanied by her daughter. Clinically, the patient reports no symptoms of neurological nature at this time. However, she was admitted in July of 2018 with sudden onset of slurred speech, right arm numbness and weakness. CT of the brain revealed a very small hematoma. This was confirmed on an MRI examination. CT angiogram of the head and neck at that time showed stability of the hematoma. The CT also revealed the presence of the distal right A2 segment 4 mm x 5 mm saccular aneurysm associated with possible nidus. There was also question of an outpouching in the region of the anterior communicating artery region, and also the left internal carotid artery terminus. The patient additionally underwent other cardiac workup  and stroke workup which revealed an ejection fraction of 55-60%. Diabetes was reasonably controlled with blood sugars running in the 150-160s. Following her discharge, the patient has returned to her normal activities. She denies any headaches, nausea, vomiting, visual symptoms, gait abnormalities, motor, sensory or station and gait abnormalities. This was confirmed by her daughter. Review of systems is negative for pathologic symptomatology except for as mentioned above. Past Medical History: Diabetes mellitus. Stroke. Question of high blood pressure. Medications: Cholecalciferol tablets. Coenzyme Q. Zetia. Ferrous sulfate. Krill oil caps. Metformin. Allergies: Penicillins causes generalized rash. Percocet also causes generalized rash. Statins induce a fleeting rash. Social History: Patient lives with her daughter. Denies smoking cigarettes  which she stopped 60 years ago. She is not around second hand smoke. Denies using illicit chemicals. Family History: Lung cancer in her Dad.  Mother had heart problems. REVIEW OF SYSTEMS: Essentially negative for pathologic symptomatology except for as mentioned above. PHYSICAL EXAMINATION: In no acute distress.  Affect appropriate to the situation. Neurologically intact. ASSESSMENT AND PLAN: The patient's recent CT angiogram of the head and neck, and MRI MRA of the brain were reviewed with her and her daughter. Brought to their attention was the small left basal ganglia hemorrhage resolving. Also brought to their attention was the right pericallosal artery A2 segment 4 mm x 5 mm saccular aneurysm associated with small vessels in the vicinity. Also noted on the MRA of the brain was a 3 mm A-comm region, and a 2 mm left carotid terminus outpouching. The patient was advised that for further evaluation of the suspected abnormalities, a diagnostic catheter arteriogram would be needed. The procedure, the risks, benefits and alternatives were all reviewed. The risk of stroke of less than 1% was mentioned. Questions were answered to their satisfaction. The patient would like to proceed with a diagnostic catheter arteriogram to more accurately characterize the suspected abnormalities seen on CT angiogram and MRA examination. This will be scheduled as soon as possible. The patient and her daughter leave with good understanding and agreement with the above management plan. They were asked to call should they have any concerns or questions. Electronically Signed   By: Luanne Bras M.D.   On: 01/17/2017 16:04    Labs:  CBC:  Recent Labs  10/25/16 1920 10/25/16 1925 10/26/16 0700 10/27/16 0524 01/29/17 0949  WBC 6.3  --  5.7 5.8 5.2  HGB 12.6 13.3 12.1 12.4 13.3  HCT 37.5 39.0 36.5 39.1 40.9  PLT 259  --  212 250 280    COAGS:  Recent Labs  10/25/16 1920 01/29/17 0949  INR 0.93 1.00  APTT 26 28      BMP:  Recent Labs  10/25/16 1920 10/25/16 1925 10/26/16 0700 10/27/16 0524 01/29/17 0949  NA 134* 139 137 139 139  K 3.7 3.8 3.8 3.8 4.0  CL 103 103 108 109 105  CO2 20*  --  24 24 24   GLUCOSE 186* 187* 199* 149* 131*  BUN 15 15 9 8 12   CALCIUM 9.2  --  8.3* 8.7* 9.1  CREATININE 0.72 0.70 0.73 0.69 0.71  GFRNONAA >60  --  >60 >60 >60  GFRAA >60  --  >60 >60 >60    LIVER FUNCTION TESTS:  Recent Labs  10/25/16 1920 10/26/16 0700 01/18/17 1230  BILITOT 0.4 0.5 0.3  AST 63* 49* 45*  ALT 134* 107* 92*  ALKPHOS 66 56  --   PROT 7.3 6.2* 7.0  ALBUMIN 4.1 3.4*  --  TUMOR MARKERS: No results for input(s): AFPTM, CEA, CA199, CHROMGRNA in the last 8760 hours.  Assessment and Plan:  Symptoms of slurred speech and R facial droop 10/2016 Small cranial hematoma found All sxs resolved Further work up revealed intracranial aneurysms Now scheduled for cerebral arteriogram for accurate characterization  Risks and benefits of cerebral arteriogram were discussed with the patient including, but not limited to bleeding, infection, vascular injury, contrast induced renal failure, stroke or even death. This interventional procedure involves the use of X-rays and because of the nature of the planned procedure, it is possible that we will have prolonged use of X-ray fluoroscopy. Potential radiation risks to you include (but are not limited to) the following: - A slightly elevated risk for cancer  several years later in life. This risk is typically less than 0.5% percent. This risk is low in comparison to the normal incidence of human cancer, which is 33% for women and 50% for men according to the [place]. - Radiation induced injury can include skin redness, resembling a rash, tissue breakdown / ulcers and hair loss (which can be temporary or permanent).  The likelihood of either of these occurring depends on the difficulty of the procedure and whether you are  sensitive to radiation due to previous procedures, disease, or genetic conditions.  IF your procedure requires a prolonged use of radiation, you will be notified and given written instructions for further action.  It is your responsibility to monitor the irradiated area for the 2 weeks following the procedure and to notify your physician if you are concerned that you have suffered a radiation induced injury.    All of the patient's questions were answered, patient is agreeable to proceed. Consent signed and in chart.  Thank you for this interesting consult.  I greatly enjoyed meeting Tamara Thompson and look forward to participating in their care.  A copy of this report was sent to the requesting provider on this date.  Electronically Signed: Lavonia Drafts, PA-C 01/29/2017, 11:06 AM   I spent a total of  30 Minutes   in face to face in clinical consultation, greater than 50% of which was counseling/coordinating care for cerebral arteriogram

## 2017-01-29 NOTE — Sedation Documentation (Signed)
Patient denies pain and is resting comfortably.  

## 2017-01-29 NOTE — Procedures (Signed)
S/P 4 vessel cerebral arteriogram. Rt CFA approach. Findinmgs. 1.Multiple small intracranial vascular malformations involving the RT frontal sucotical white matter supplied by the TRt peicallosal artery,Ly tentorial AVM supplied by the Lt superior cerebellar artery,one supplied by AICA in the Rt and one supplied by AICA on the left. 2..Prominent AVM involving the anterior ?nasal septum supplied by the distal branches of the abnormally enlarged Lt OA,and IMAX branches.

## 2017-01-29 NOTE — Progress Notes (Signed)
Left message for pt to return call.

## 2017-01-29 NOTE — Sedation Documentation (Signed)
Pt. CO back pain from the procedural table.

## 2017-01-30 ENCOUNTER — Encounter: Payer: Self-pay | Admitting: Gastroenterology

## 2017-01-30 ENCOUNTER — Encounter (HOSPITAL_COMMUNITY): Payer: Self-pay | Admitting: Interventional Radiology

## 2017-01-30 NOTE — Progress Notes (Signed)
PT is aware and OK to schedule OV.

## 2017-01-30 NOTE — Progress Notes (Signed)
Patient scheduled.

## 2017-02-05 ENCOUNTER — Telehealth (HOSPITAL_COMMUNITY): Payer: Self-pay

## 2017-02-05 NOTE — Telephone Encounter (Signed)
Called to schedule f/u, no answer, no vm. AW

## 2017-02-20 ENCOUNTER — Other Ambulatory Visit: Payer: Self-pay | Admitting: *Deleted

## 2017-02-20 ENCOUNTER — Encounter: Payer: Self-pay | Admitting: Gastroenterology

## 2017-02-20 ENCOUNTER — Encounter: Payer: Self-pay | Admitting: *Deleted

## 2017-02-20 ENCOUNTER — Ambulatory Visit (INDEPENDENT_AMBULATORY_CARE_PROVIDER_SITE_OTHER): Payer: Medicare Other | Admitting: Gastroenterology

## 2017-02-20 VITALS — BP 113/78 | HR 113 | Temp 98.5°F | Ht 65.0 in | Wt 175.2 lb

## 2017-02-20 DIAGNOSIS — I639 Cerebral infarction, unspecified: Secondary | ICD-10-CM | POA: Diagnosis not present

## 2017-02-20 DIAGNOSIS — Z86010 Personal history of colonic polyps: Secondary | ICD-10-CM

## 2017-02-20 DIAGNOSIS — R945 Abnormal results of liver function studies: Secondary | ICD-10-CM | POA: Diagnosis not present

## 2017-02-20 DIAGNOSIS — R7989 Other specified abnormal findings of blood chemistry: Secondary | ICD-10-CM

## 2017-02-20 MED ORDER — NA SULFATE-K SULFATE-MG SULF 17.5-3.13-1.6 GM/177ML PO SOLN: 1.0000 | ORAL | 0 refills | Status: DC

## 2017-02-20 NOTE — Patient Instructions (Addendum)
Please have blood work done. Your elevated liver numbers are likely related to a fatty liver.  We have scheduled you for a colonoscopy with Dr. Oneida Alar in the near future.  Please stop iron for 7 days before the colonoscopy. Do not take metformin the day of the procedure.  We will see you after the procedure for follow-up!

## 2017-02-20 NOTE — Progress Notes (Addendum)
REVIEWED-NO ADDITIONAL RECOMMENDATIONS.  Referring Provider: Redmond School, MD Primary Care Physician:  Redmond School, MD Primary GI: Dr. Oneida Alar   Chief Complaint  Patient presents with  . Colonoscopy    HPI:   Tamara Thompson is a 66 y.o. female presenting today with a history of multiple colon polyps in 2015, sessile serrated adenoma. Had acute stroke in July 2018. No physical deficits after stroke. Elevated transaminases noted in setting of acute illness while hospitalized for stroke. Rechecked recently with persistently elevated transaminases. Fatty liver on ultrasound.   No constipation, diarrhea, rectal bleeding. Good appetite. Ferrous sulfate TID. No NSAIDs. She states she got Hep B and C tested at PCP. We will need to retrieve these.   Past Medical History:  Diagnosis Date  . Anemia   . Bleeding from the nose   . Deviated septum   . Diabetes mellitus without complication ([place])   . Kidney stones   . Medical history non-contributory   . Stroke [facility])     Past Surgical History:  Procedure Laterality Date  . ANKLE SURGERY  2010   left  . CESAREAN SECTION    . COLONOSCOPY N/A 12/14/2013   Dr. Oneida Alar: 10 colon polyps removed, ascending colon sessile serrated adenoma, hyperplastic polyps. Moderate diverticulosis throughout colon. Left colon redundant  . IR ANGIO INTRA EXTRACRAN SEL COM CAROTID INNOMINATE BILAT MOD SED  01/29/2017  . IR ANGIO VERTEBRAL SEL VERTEBRAL BILAT MOD SED  01/29/2017  . IR RADIOLOGIST EVAL & MGMT  01/17/2017  . NASAL ENDOSCOPY WITH EPISTAXIS CONTROL Bilateral 02/07/2015   Procedure: BILATERAL NASAL ENDOSCOPIC CAUTERIZATION;  Surgeon: Leta Baptist, MD;  Location: [place];  Service: ENT;  Laterality: Bilateral;  . NASAL SEPTOPLASTY W/ TURBINOPLASTY Bilateral 02/02/2014   Procedure: RIGHT NASAL SEPTOPLASTY WITH TURBINATE REDUCTION;  Surgeon: Ascencion Dike, MD;  Location: [place];  Service: ENT;  Laterality: Bilateral;  .  OVARIAN CYST SURGERY    . POLYPECTOMY Right 02/02/2014   Procedure: RIGHT POLYPECTOMY NASAL;  Surgeon: Ascencion Dike, MD;  Location: [place];  Service: ENT;  Laterality: Right;  . Surgery of jaw and cheek fracture      Current Outpatient Prescriptions  Medication Sig Dispense Refill  . Cholecalciferol 2000 units TABS Take 2,000 Units by mouth daily.     . Coenzyme Q10 (CO Q 10) 100 MG CAPS Take 1 capsule by mouth daily.    Marland Kitchen ezetimibe (ZETIA) 10 MG tablet Take 10 mg by mouth daily.    . ferrous sulfate 325 (65 FE) MG tablet Take 325 mg by mouth 3 (three) times daily with meals.    Javier Docker Oil 300 MG CAPS Take 1 capsule by mouth daily.     . metFORMIN (GLUCOPHAGE) 500 MG tablet Take 500 mg by mouth 2 (two) times daily.     No current facility-administered medications for this visit.     Allergies as of 02/20/2017 - Review Complete 02/20/2017  Allergen Reaction Noted  . Penicillins  02/28/2012  . Statins Other (See Comments) 01/22/2017  . Percocet [oxycodone-acetaminophen] Rash 01/28/2015    Family History  Problem Relation Age of Onset  . Heart disease Mother   . Cancer Father        lung  . Stroke Father   . Colon cancer Neg Hx     Social History   Social History  . Marital status: Married    Spouse name: N/A  . Number of children: N/A  .  Years of education: N/A   Occupational History  . housewife    Social History Main Topics  . Smoking status: Former Smoker    Packs/day: 1.25    Years: 50.00    Types: Cigarettes    Quit date: 01/27/2010  . Smokeless tobacco: Never Used  . Alcohol use Yes     Comment: rarely, not in past few months  . Drug use: No  . Sexual activity: Not Currently    Birth control/ protection: Post-menopausal   Other Topics Concern  . None   Social History Narrative  . None    Review of Systems: Gen: Denies fever, chills, anorexia. Denies fatigue, weakness, weight loss.  CV: Denies chest pain, palpitations, syncope,  peripheral edema, and claudication. Resp: Denies dyspnea at rest, cough, wheezing, coughing up blood, and pleurisy. GI: see HPI  Derm: Denies rash, itching, dry skin Psych: Denies depression, anxiety, memory loss, confusion. No homicidal or suicidal ideation.  Heme: Denies bruising, bleeding, and enlarged lymph nodes.  Physical Exam: BP 113/78   Pulse (!) 113   Temp 98.5 F (36.9 C) (Oral)   Ht 5' 5"  (1.651 m)   Wt 175 lb 3.2 oz (79.5 kg)   BMI 29.15 kg/m  General:   Alert and oriented. No distress noted. Pleasant and cooperative.  Head:  Normocephalic and atraumatic. Eyes:  Conjuctiva clear without scleral icterus. Mouth:  Oral mucosa pink and moist.  Cardiac: S1 S2 present with systolic murmur  Abdomen:  +BS, soft, non-tender and non-distended. No rebound or guarding. No HSM or masses noted. Msk:  Symmetrical without gross deformities. Normal posture. Extremities:  Without edema. Neurologic:  Alert and  oriented x4 Psych:  Alert and cooperative. Normal mood and affect.

## 2017-02-25 ENCOUNTER — Other Ambulatory Visit: Payer: Self-pay | Admitting: Urology

## 2017-02-25 DIAGNOSIS — D509 Iron deficiency anemia, unspecified: Secondary | ICD-10-CM | POA: Diagnosis not present

## 2017-02-25 DIAGNOSIS — N200 Calculus of kidney: Secondary | ICD-10-CM

## 2017-02-25 DIAGNOSIS — R945 Abnormal results of liver function studies: Secondary | ICD-10-CM | POA: Diagnosis not present

## 2017-02-26 ENCOUNTER — Telehealth (HOSPITAL_COMMUNITY): Payer: Self-pay

## 2017-02-26 NOTE — Telephone Encounter (Signed)
Called to schedule f/u, left message for pt to return call. AW

## 2017-02-27 NOTE — Assessment & Plan Note (Signed)
66 year old female with history of sessile serrated adenoma, multiple polyps in 2015 and due for surveillance now. She has no concerning lower GI symptoms.  Proceed with colonoscopy with Dr. Oneida Alar in the near future. The risks, benefits, and alternatives have been discussed in detail with the patient. They state understanding and desire to proceed.  Hold iron 7 days prior NO metformin day of procedure Return after colonoscopy

## 2017-02-27 NOTE — Assessment & Plan Note (Signed)
Known fatty liver. Reports she had Hep B and C serologies at PCP. Will retrieve these and order extensive serologies now. Return after colonoscopy.

## 2017-02-27 NOTE — Progress Notes (Signed)
cc'ed to pcp °

## 2017-02-28 LAB — CERULOPLASMIN: Ceruloplasmin: 26 mg/dL (ref 18–53)

## 2017-02-28 LAB — ALPHA-1 ANTITRYPSIN PHENOTYPE: A1 ANTITRYPSIN SER: 146 mg/dL (ref 83–199)

## 2017-02-28 LAB — ANA: Anti Nuclear Antibody(ANA): POSITIVE — AB

## 2017-02-28 LAB — IRON,TIBC AND FERRITIN PANEL
%SAT: 36 % (calc) (ref 11–50)
FERRITIN: 58 ng/mL (ref 20–288)
Iron: 138 ug/dL (ref 45–160)
TIBC: 380 mcg/dL (calc) (ref 250–450)

## 2017-02-28 LAB — ANTI-NUCLEAR AB-TITER (ANA TITER)

## 2017-02-28 LAB — ANTI-SMOOTH MUSCLE ANTIBODY, IGG: ACTIN (SMOOTH MUSCLE) ANTIBODY (IGG): 30 U — AB (ref <20)

## 2017-02-28 LAB — IGG, IGA, IGM
IMMUNOGLOBULIN A: 124 mg/dL (ref 81–463)
IgG (Immunoglobin G), Serum: 1322 mg/dL (ref 694–1618)
IgM, Serum: 220 mg/dL (ref 48–271)

## 2017-02-28 LAB — MITOCHONDRIAL ANTIBODIES: Mitochondrial M2 Ab, IgG: <=20 U

## 2017-03-04 ENCOUNTER — Ambulatory Visit (HOSPITAL_COMMUNITY)
Admission: RE | Admit: 2017-03-04 | Discharge: 2017-03-04 | Disposition: A | Discharge status: Home / Self Care | Payer: Medicare Other | Source: Ambulatory Visit | Attending: Urology | Admitting: Urology

## 2017-03-04 DIAGNOSIS — N200 Calculus of kidney: Secondary | ICD-10-CM | POA: Diagnosis not present

## 2017-03-04 DIAGNOSIS — N281 Cyst of kidney, acquired: Secondary | ICD-10-CM | POA: Diagnosis not present

## 2017-03-05 ENCOUNTER — Other Ambulatory Visit (HOSPITAL_COMMUNITY): Payer: Self-pay | Admitting: Interventional Radiology

## 2017-03-05 DIAGNOSIS — I729 Aneurysm of unspecified site: Secondary | ICD-10-CM

## 2017-03-08 ENCOUNTER — Ambulatory Visit (INDEPENDENT_AMBULATORY_CARE_PROVIDER_SITE_OTHER): Payer: Medicare Other | Admitting: Urology

## 2017-03-08 DIAGNOSIS — N281 Cyst of kidney, acquired: Secondary | ICD-10-CM

## 2017-03-08 DIAGNOSIS — N200 Calculus of kidney: Secondary | ICD-10-CM | POA: Diagnosis not present

## 2017-03-18 ENCOUNTER — Telehealth (HOSPITAL_COMMUNITY): Payer: Self-pay

## 2017-03-18 NOTE — Telephone Encounter (Signed)
Called to reschedule consult, left message for pt to return call. AW

## 2017-03-22 NOTE — Progress Notes (Signed)
Due to elevated transaminases, further serologies were ordered. She has a positive ASMA and ANA. Would recommend liver biopsy if she is agreeable. She has a colonoscopy upcoming in early December.

## 2017-03-25 ENCOUNTER — Other Ambulatory Visit: Payer: Self-pay

## 2017-03-25 DIAGNOSIS — R7989 Other specified abnormal findings of blood chemistry: Secondary | ICD-10-CM

## 2017-03-25 DIAGNOSIS — R945 Abnormal results of liver function studies: Secondary | ICD-10-CM

## 2017-03-25 DIAGNOSIS — R768 Other specified abnormal immunological findings in serum: Secondary | ICD-10-CM

## 2017-03-25 NOTE — Progress Notes (Signed)
Pt is aware. She is scheduled Friday for the colonoscopy. Ok to order the liver biopsy.

## 2017-03-27 ENCOUNTER — Ambulatory Visit (HOSPITAL_COMMUNITY): Payer: Medicare Other

## 2017-03-29 ENCOUNTER — Other Ambulatory Visit: Payer: Self-pay

## 2017-03-29 ENCOUNTER — Ambulatory Visit (HOSPITAL_COMMUNITY)
Admission: RE | Admit: 2017-03-29 | Discharge: 2017-03-29 | Disposition: A | Discharge status: Home / Self Care | Payer: Medicare Other | Source: Ambulatory Visit | Attending: Gastroenterology | Admitting: Gastroenterology

## 2017-03-29 ENCOUNTER — Encounter (HOSPITAL_COMMUNITY): Payer: Self-pay | Admitting: *Deleted

## 2017-03-29 ENCOUNTER — Encounter (HOSPITAL_COMMUNITY)
Admission: RE | Disposition: A | Discharge status: Home / Self Care | Payer: Self-pay | Source: Ambulatory Visit | Attending: Gastroenterology

## 2017-03-29 DIAGNOSIS — K635 Polyp of colon: Secondary | ICD-10-CM | POA: Diagnosis not present

## 2017-03-29 DIAGNOSIS — K644 Residual hemorrhoidal skin tags: Secondary | ICD-10-CM | POA: Insufficient documentation

## 2017-03-29 DIAGNOSIS — Z87891 Personal history of nicotine dependence: Secondary | ICD-10-CM | POA: Insufficient documentation

## 2017-03-29 DIAGNOSIS — K5730 Diverticulosis of large intestine without perforation or abscess without bleeding: Secondary | ICD-10-CM | POA: Insufficient documentation

## 2017-03-29 DIAGNOSIS — D127 Benign neoplasm of rectosigmoid junction: Secondary | ICD-10-CM | POA: Diagnosis not present

## 2017-03-29 DIAGNOSIS — E119 Type 2 diabetes mellitus without complications: Secondary | ICD-10-CM | POA: Diagnosis not present

## 2017-03-29 DIAGNOSIS — Z880 Allergy status to penicillin: Secondary | ICD-10-CM | POA: Insufficient documentation

## 2017-03-29 DIAGNOSIS — Z09 Encounter for follow-up examination after completed treatment for conditions other than malignant neoplasm: Secondary | ICD-10-CM | POA: Diagnosis present

## 2017-03-29 DIAGNOSIS — Z860100 Personal history of colon polyps, unspecified: Secondary | ICD-10-CM

## 2017-03-29 DIAGNOSIS — Z885 Allergy status to narcotic agent status: Secondary | ICD-10-CM | POA: Insufficient documentation

## 2017-03-29 DIAGNOSIS — D125 Benign neoplasm of sigmoid colon: Secondary | ICD-10-CM | POA: Diagnosis not present

## 2017-03-29 DIAGNOSIS — Z8719 Personal history of other diseases of the digestive system: Secondary | ICD-10-CM | POA: Diagnosis not present

## 2017-03-29 DIAGNOSIS — Z7984 Long term (current) use of oral hypoglycemic drugs: Secondary | ICD-10-CM | POA: Insufficient documentation

## 2017-03-29 DIAGNOSIS — Z888 Allergy status to other drugs, medicaments and biological substances status: Secondary | ICD-10-CM | POA: Diagnosis not present

## 2017-03-29 DIAGNOSIS — D122 Benign neoplasm of ascending colon: Secondary | ICD-10-CM | POA: Insufficient documentation

## 2017-03-29 DIAGNOSIS — K648 Other hemorrhoids: Secondary | ICD-10-CM | POA: Diagnosis not present

## 2017-03-29 DIAGNOSIS — Z8673 Personal history of transient ischemic attack (TIA), and cerebral infarction without residual deficits: Secondary | ICD-10-CM | POA: Diagnosis not present

## 2017-03-29 DIAGNOSIS — D128 Benign neoplasm of rectum: Secondary | ICD-10-CM | POA: Diagnosis not present

## 2017-03-29 DIAGNOSIS — Z791 Long term (current) use of non-steroidal anti-inflammatories (NSAID): Secondary | ICD-10-CM | POA: Insufficient documentation

## 2017-03-29 DIAGNOSIS — Z79899 Other long term (current) drug therapy: Secondary | ICD-10-CM | POA: Diagnosis not present

## 2017-03-29 HISTORY — PX: COLONOSCOPY: SHX5424

## 2017-03-29 HISTORY — PX: POLYPECTOMY: SHX5525

## 2017-03-29 LAB — GLUCOSE, CAPILLARY: Glucose-Capillary: 137 mg/dL — ABNORMAL HIGH (ref 65–99)

## 2017-03-29 SURGERY — COLONOSCOPY
Anesthesia: Moderate Sedation

## 2017-03-29 MED ORDER — SPOT INK MARKER SYRINGE KIT
PACK | SUBMUCOSAL | Status: AC
  Filled 2017-03-29: qty 5

## 2017-03-29 MED ORDER — MIDAZOLAM HCL 5 MG/5ML IJ SOLN
INTRAMUSCULAR | Status: AC
  Filled 2017-03-29: qty 10

## 2017-03-29 MED ORDER — PROMETHAZINE HCL 25 MG/ML IJ SOLN
INTRAMUSCULAR | Status: DC | PRN
  Administered 2017-03-29: 12.5 mg via INTRAVENOUS

## 2017-03-29 MED ORDER — STERILE WATER FOR IRRIGATION IR SOLN
Status: DC | PRN
  Administered 2017-03-29: 12:00:00

## 2017-03-29 MED ORDER — MEPERIDINE HCL 100 MG/ML IJ SOLN
INTRAMUSCULAR | Status: AC
  Filled 2017-03-29: qty 2

## 2017-03-29 MED ORDER — MIDAZOLAM HCL 5 MG/5ML IJ SOLN
INTRAMUSCULAR | Status: DC | PRN
Start: 2017-03-29 — End: 2017-03-29
  Administered 2017-03-29 (×2): 2 mg via INTRAVENOUS

## 2017-03-29 MED ORDER — SODIUM CHLORIDE 0.9 % IV SOLN
INTRAVENOUS | Status: DC
  Administered 2017-03-29: 11:00:00 via INTRAVENOUS

## 2017-03-29 MED ORDER — PROMETHAZINE HCL 25 MG/ML IJ SOLN
INTRAMUSCULAR | Status: AC
  Filled 2017-03-29: qty 1

## 2017-03-29 MED ORDER — MEPERIDINE HCL 100 MG/ML IJ SOLN
INTRAMUSCULAR | Status: DC | PRN
  Administered 2017-03-29: 50 mg via INTRAVENOUS

## 2017-03-29 MED ORDER — SPOT INK MARKER SYRINGE KIT
PACK | SUBMUCOSAL | Status: DC | PRN
  Administered 2017-03-29: 1 mL via SUBMUCOSAL

## 2017-03-29 NOTE — Discharge Instructions (Signed)
You had 7 polyps removed. I PLACED ONE CLIP TO PREVENT BLEEDING IN 7-10 DAYS.  You have  SMALL internal hemorrhoids.   IF YOU NEED AN MRI, LET THE RADIOLOGY TECH KNOW THAT YOU HAD ONE METAL CLIP PLACED IN YOUR COLON. IT SHOULD FALL OFF IN 30 DAYS.  DRINK WATER TO KEEP YOUR URINE LIGHT YELLOW.  FOLLOW A HIGH FIBER DIET. AVOID ITEMS THAT CAUSE BLOATING & GAS. SEE INFO BELOW.  YOUR BIOPSY RESULTS WILL BE AVAILABLE IN MY CHART AFTER DEC 11 AND MY OFFICE WILL CONTACT YOU IN 10-14 DAYS WITH YOUR RESULTS.   Next colonoscopy in 3 years.    Colonoscopy Care After Read the instructions outlined below and refer to this sheet in the next week. These discharge instructions provide you with general information on caring for yourself after you leave the hospital. While your treatment has been planned according to the most current medical practices available, unavoidable complications occasionally occur. If you have any problems or questions after discharge, call DR. Sorcha Rotunno, [phone].  ACTIVITY  You may resume your regular activity, but move at a slower pace for the next 24 hours.   Take frequent rest periods for the next 24 hours.   Walking will help get rid of the air and reduce the bloated feeling in your belly (abdomen).   No driving for 24 hours (because of the medicine (anesthesia) used during the test).   You may shower.   Do not sign any important legal documents or operate any machinery for 24 hours (because of the anesthesia used during the test).    NUTRITION  Drink plenty of fluids.   You may resume your normal diet as instructed by your doctor.   Begin with a light meal and progress to your normal diet. Heavy or fried foods are harder to digest and may make you feel sick to your stomach (nauseated).   Avoid alcoholic beverages for 24 hours or as instructed.    MEDICATIONS  You may resume your normal medications.   WHAT YOU CAN EXPECT TODAY  Some feelings of  bloating in the abdomen.   Passage of more gas than usual.   Spotting of blood in your stool or on the toilet paper  .  IF YOU HAD POLYPS REMOVED DURING THE COLONOSCOPY:  Eat a soft diet IF YOU HAVE NAUSEA, BLOATING, ABDOMINAL PAIN, OR VOMITING.    FINDING OUT THE RESULTS OF YOUR TEST Not all test results are available during your visit. DR. Oneida Alar WILL CALL YOU WITHIN 14 DAYS OF YOUR PROCEDUE WITH YOUR RESULTS. Do not assume everything is normal if you have not heard from DR. Zeke Aker, CALL HER OFFICE AT [phone].  SEEK IMMEDIATE MEDICAL ATTENTION AND CALL THE OFFICE: [phone] IF:  You have more than a spotting of blood in your stool.   Your belly is swollen (abdominal distention).   You are nauseated or vomiting.   You have a temperature over 101F.   You have abdominal pain or discomfort that is severe or gets worse throughout the day.   High-Fiber Diet A high-fiber diet changes your normal diet to include more whole grains, legumes, fruits, and vegetables. Changes in the diet involve replacing refined carbohydrates with unrefined foods. The calorie level of the diet is essentially unchanged. The Dietary Reference Intake (recommended amount) for adult males is 38 grams per day. For adult females, it is 25 grams per day. Pregnant and lactating women should consume 28 grams of fiber per day. Fiber is  the intact part of a plant that is not broken down during digestion. Functional fiber is fiber that has been isolated from the plant to provide a beneficial effect in the body. PURPOSE  Increase stool bulk.   Ease and regulate bowel movements.   Lower cholesterol.   REDUCE RISK OF COLON CANCER  INDICATIONS THAT YOU NEED MORE FIBER  Constipation and hemorrhoids.   Uncomplicated diverticulosis (intestine condition) and irritable bowel syndrome.   Weight management.   As a protective measure against hardening of the arteries (atherosclerosis), diabetes, and cancer.    GUIDELINES FOR INCREASING FIBER IN THE DIET  Start adding fiber to the diet slowly. A gradual increase of about 5 more grams (2 slices of whole-wheat bread, 2 servings of most fruits or vegetables, or 1 bowl of high-fiber cereal) per day is best. Too rapid an increase in fiber may result in constipation, flatulence, and bloating.   Drink enough water and fluids to keep your urine clear or pale yellow. Water, juice, or caffeine-free drinks are recommended. Not drinking enough fluid may cause constipation.   Eat a variety of high-fiber foods rather than one type of fiber.   Try to increase your intake of fiber through using high-fiber foods rather than fiber pills or supplements that contain small amounts of fiber.   The goal is to change the types of food eaten. Do not supplement your present diet with high-fiber foods, but replace foods in your present diet.   INCLUDE A VARIETY OF FIBER SOURCES  Replace refined and processed grains with whole grains, canned fruits with fresh fruits, and incorporate other fiber sources. White rice, white breads, and most bakery goods contain little or no fiber.   Brown whole-grain rice, buckwheat oats, and many fruits and vegetables are all good sources of fiber. These include: broccoli, Brussels sprouts, cabbage, cauliflower, beets, sweet potatoes, white potatoes (skin on), carrots, tomatoes, eggplant, squash, berries, fresh fruits, and dried fruits.   Cereals appear to be the richest source of fiber. Cereal fiber is found in whole grains and bran. Bran is the fiber-rich outer coat of cereal grain, which is largely removed in refining. In whole-grain cereals, the bran remains. In breakfast cereals, the largest amount of fiber is found in those with "bran" in their names. The fiber content is sometimes indicated on the label.   You may need to include additional fruits and vegetables each day.   In baking, for 1 cup white flour, you may use the following  substitutions:   1 cup whole-wheat flour minus 2 tablespoons.   1/2 cup white flour plus 1/2 cup whole-wheat flour.   Polyps, Colon  A polyp is extra tissue that grows inside your body. Colon polyps grow in the large intestine. The large intestine, also called the colon, is part of your digestive system. It is a long, hollow tube at the end of your digestive tract where your body makes and stores stool. Most polyps are not dangerous. They are benign. This means they are not cancerous. But over time, some types of polyps can turn into cancer. Polyps that are smaller than a pea are usually not harmful. But larger polyps could someday become or may already be cancerous. To be safe, doctors remove all polyps and test them.   WHO GETS POLYPS? Anyone can get polyps, but certain people are more likely than others. You may have a greater chance of getting polyps if:  You are over 50.   You have had polyps  before.   Someone in your family has had polyps.   Someone in your family has had cancer of the large intestine.   Find out if someone in your family has had polyps. You may also be more likely to get polyps if you:   Eat a lot of fatty foods   Smoke   Drink alcohol   Do not exercise  Eat too much   PREVENTION There is not one sure way to prevent polyps. You might be able to lower your risk of getting them if you:  Eat more fruits and vegetables and less fatty food.   Do not smoke.   Avoid alcohol.   Exercise every day.   Lose weight if you are overweight.   Eating more calcium and folate can also lower your risk of getting polyps. Some foods that are rich in calcium are milk, cheese, and broccoli. Some foods that are rich in folate are chickpeas, kidney beans, and spinach.   Hemorrhoids Hemorrhoids are dilated (enlarged) veins around the rectum. Sometimes clots will form in the veins. This makes them swollen and painful. These are called thrombosed hemorrhoids. Causes of  hemorrhoids include:  Constipation.   Straining to have a bowel movement.   HEAVY LIFTING  HOME CARE INSTRUCTIONS  Eat a well balanced diet and drink 6 to 8 glasses of water every day to avoid constipation. You may also use a bulk laxative.   Avoid straining to have bowel movements.   Keep anal area dry and clean.   Do not use a donut shaped pillow or sit on the toilet for long periods. This increases blood pooling and pain.   Move your bowels when your body has the urge; this will require less straining and will decrease pain and pressure.

## 2017-03-29 NOTE — H&P (Signed)
Primary Care Physician:  Redmond School, MD Primary Gastroenterologist:  Dr. Oneida Alar  Pre-Procedure History & Physical: HPI:  Tamara Thompson is a 66 y.o. female here for  PERSONAL HISTORY OF POLYPS.  Past Medical History:  Diagnosis Date  . Anemia   . Bleeding from the nose   . Deviated septum   . Diabetes mellitus without complication ([place])   . Kidney stones   . Medical history non-contributory   . Stroke [facility])     Past Surgical History:  Procedure Laterality Date  . ANKLE SURGERY  2010   left  . CESAREAN SECTION    . COLONOSCOPY N/A 12/14/2013   Dr. Oneida Alar: 10 colon polyps removed, ascending colon sessile serrated adenoma, hyperplastic polyps. Moderate diverticulosis throughout colon. Left colon redundant  . IR ANGIO INTRA EXTRACRAN SEL COM CAROTID INNOMINATE BILAT MOD SED  01/29/2017  . IR ANGIO VERTEBRAL SEL VERTEBRAL BILAT MOD SED  01/29/2017  . IR RADIOLOGIST EVAL & MGMT  01/17/2017  . NASAL ENDOSCOPY WITH EPISTAXIS CONTROL Bilateral 02/07/2015   Procedure: BILATERAL NASAL ENDOSCOPIC CAUTERIZATION;  Surgeon: Leta Baptist, MD;  Location: [place];  Service: ENT;  Laterality: Bilateral;  . NASAL SEPTOPLASTY W/ TURBINOPLASTY Bilateral 02/02/2014   Procedure: RIGHT NASAL SEPTOPLASTY WITH TURBINATE REDUCTION;  Surgeon: Ascencion Dike, MD;  Location: [place];  Service: ENT;  Laterality: Bilateral;  . OVARIAN CYST SURGERY    . POLYPECTOMY Right 02/02/2014   Procedure: RIGHT POLYPECTOMY NASAL;  Surgeon: Ascencion Dike, MD;  Location: [place];  Service: ENT;  Laterality: Right;  . Surgery of jaw and cheek fracture      Prior to Admission medications   Medication Sig Start Date End Date Taking? Authorizing Provider  acetaminophen (TYLENOL) 500 MG tablet Take 1,000 mg by mouth daily as needed for moderate pain or headache.   Yes [provider]  Cholecalciferol 2000 units TABS Take 2,000 Units by mouth daily.    Yes [provider]  Coenzyme Q10 (CO Q 10) 100 MG CAPS Take 100 mg by mouth daily.    Yes [provider]  ezetimibe (ZETIA) 10 MG tablet Take 10 mg by mouth daily. 11/02/16  Yes [provider]  ferrous sulfate 325 (65 FE) MG tablet Take 325 mg by mouth 3 (three) times daily.    Yes [provider]  Javier Docker Oil 300 MG CAPS Take 300 mg by mouth daily.    Yes [provider]  metFORMIN (GLUCOPHAGE) 500 MG tablet Take 500 mg by mouth 2 (two) times daily. 11/02/16  Yes [provider]  Na Sulfate-K Sulfate-Mg Sulf 17.5-3.13-1.6 GM/177ML SOLN Take 1 kit by mouth as directed. 02/20/17  Yes FieldsMarga Melnick, MD    Allergies as of 02/20/2017 - Review Complete 02/20/2017  Allergen Reaction Noted  . Penicillins  02/28/2012  . Statins Other (See Comments) 01/22/2017  . Percocet [oxycodone-acetaminophen] Rash 01/28/2015    Family History  Problem Relation Age of Onset  . Heart disease Mother   . Cancer Father        lung  . Stroke Father   . Colon cancer Neg Hx     Social History   Socioeconomic History  . Marital status: Married    Spouse name: Not on file  . Number of children: Not on file  . Years of education: Not on file  . Highest education level: Not on file  Social Needs  . Financial resource strain: Not  on file  . Food insecurity - worry: Not on file  . Food insecurity - inability: Not on file  . Transportation needs - medical: Not on file  . Transportation needs - non-medical: Not on file  Occupational History  . Occupation: housewife  Tobacco Use  . Smoking status: Former Smoker    Packs/day: 1.25    Years: 50.00    Pack years: 62.50    Types: Cigarettes    Last attempt to quit: 01/27/2010    Years since quitting: 7.1  . Smokeless tobacco: Never Used  Substance and Sexual Activity  . Alcohol use: Yes    Comment: rarely, not in past few months  . Drug use: No  . Sexual activity: Not Currently    Birth control/protection:  Post-menopausal  Other Topics Concern  . Not on file  Social History Narrative  . Not on file    Review of Systems: See HPI, otherwise negative ROS   Physical Exam: BP 125/87   Pulse 96   Temp 97.8 F (36.6 C) (Oral)   Resp 15   Ht 5' 5"  (1.651 m)   Wt 171 lb (77.6 kg)   SpO2 94%   BMI 28.46 kg/m  General:   Alert,  pleasant and cooperative in NAD Head:  Normocephalic and atraumatic. Neck:  Supple; Lungs:  Clear throughout to auscultation.    Heart:  Regular rate and rhythm. Abdomen:  Soft, nontender and nondistended. Normal bowel sounds, without guarding, and without rebound.   Neurologic:  Alert and  oriented x4;  grossly normal neurologically.  Impression/Plan:     PERSONAL HISTORY OF POLYPS.  PLAN: 1. TCS TODAY DISCUSSED PROCEDURE, BENEFITS, & RISKS: < 1% chance of medication reaction, bleeding, perforation, or rupture of spleen/liver.

## 2017-03-29 NOTE — Op Note (Signed)
[facility] Patient Name: Tamara Thompson Procedure Date: 03/29/2017 11:23 AM MRN: [id number] Date of Birth: [birth year] Attending MD: Barney Drain MD, MD CSN: [id number] Age: 66 Admit Type: Inpatient Procedure:                Colonoscopy WITH EMR & COLD SNARE POLYPECTOMY Indications:              Personal history of colonic polyps Providers:                Barney Drain MD, MD, Otis Peak B. Sharon Seller, RN, Nelma Rothman, Technician, Aram Candela Referring MD:             Redmond School, MD Medicines:                Promethazine 12.5 mg IV, Meperidine 50 mg IV,                            Midazolam 4 mg IV Complications:            No immediate complications. Estimated Blood Loss:     Estimated blood loss was minimal. Procedure:                Pre-Anesthesia Assessment:                           - Prior to the procedure, a History and Physical                            was performed, and patient medications and                            allergies were reviewed. The patient's tolerance of                            previous anesthesia was also reviewed. The risks                            and benefits of the procedure and the sedation                            options and risks were discussed with the patient.                            All questions were answered, and informed consent                            was obtained. Prior Anticoagulants: The patient has                            taken no previous anticoagulant or antiplatelet                            agents. ASA Grade Assessment: II - A patient with  mild systemic disease. After reviewing the risks                            and benefits, the patient was deemed in                            satisfactory condition to undergo the procedure.                            After obtaining informed consent, the colonoscope                            was passed under direct vision.  Throughout the                            procedure, the patient's blood pressure, pulse, and                            oxygen saturations were monitored continuously. The                            [id number]) scope was introduced through                            the anus and advanced to the the cecum, identified                            by appendiceal orifice and ileocecal valve. The                            colonoscopy was technically difficult and complex                            due to a tortuous colon. Successful completion of                            the procedure was aided by increasing the dose of                            sedation medication, straightening and shortening                            the scope to obtain bowel loop reduction and                            COLOWRAP. The patient tolerated the procedure                            fairly well. The quality of the bowel preparation                            was excellent. The ileocecal valve, appendiceal  orifice, and rectum were photographed. Scope In: 11:57:07 AM Scope Out: 12:25:48 PM Scope Withdrawal Time: 0 hours 21 minutes 26 seconds  Total Procedure Duration: 0 hours 28 minutes 41 seconds  Findings:      A 10 mm polyp was found in the proximal ascending colon(BTL 2). The       polyp was carpet-like and flat. Area was successfully injected with 2 mL       ELEVIEW for a lift polypectomy. The polyp was removed with a cold snare.       ENDOSCOPIC MUCOSAL RESECTION and retrieval were complete. Area was       tattooed with an injection of 1 mL of Spot (carbon black). To prevent       bleeding after the polypectomy, one hemostatic clip was successfully       placed (MR conditional). There was no bleeding at the end of the       procedure. Coagulation for destruction of remaining portion of lesion       using argon plasma at 0.5 liters/minute and 20 watts was successful.       Multiple small and large-mouthed diverticula were found in the       recto-sigmoid colon and sigmoid colon.      Six sessile polyps were found in the rectum and sigmoid colon. The       polyps were 3 to 4 mm in size. These polyps were removed with a cold       snare. Resection and retrieval were complete.      External and internal hemorrhoids were found during retroflexion. The       hemorrhoids were moderate. Impression:               - One 10 mm polyp in the proximal ascending colon,                            removed with a cold snare. Resected and retrieved.                            Injected. Tattooed. Clip (MR conditional) was                            placed.                           - Diverticulosis in the recto-sigmoid colon and in                            the sigmoid colon.                           - Five 3 to 4 mm polyps in the rectum, removed with                            a cold snare. Resected and retrieved.                           - External and internal hemorrhoids. Moderate Sedation:      Moderate (conscious) sedation was administered by the endoscopy nurse       and supervised by the  endoscopist. The following parameters were       monitored: oxygen saturation, heart rate, blood pressure, and response       to care. Total physician intraservice time was 40 minutes. Recommendation:           - Repeat colonoscopy in 3 years for surveillance.                           - High fiber diet.                           - Continue present medications.                           - Await pathology results.                           - Patient has a contact number available for                            emergencies. The signs and symptoms of potential                            delayed complications were discussed with the                            patient. Return to normal activities tomorrow.                            Written discharge instructions were provided to the                             patient. Procedure Code(s):        --- Professional ---                           [phone], Colonoscopy, flexible; with removal of                            tumor(s), polyp(s), or other lesion(s) by snare                            technique                           45381, Colonoscopy, flexible; with directed                            submucosal injection(s), any substance                           10932, Moderate sedation services provided by the                            same physician or other qualified health care  professional performing the diagnostic or                            therapeutic service that the sedation supports,                            requiring the presence of an independent trained                            observer to assist in the monitoring of the                            patient's level of consciousness and physiological                            status; initial 15 minutes of intraservice time,                            patient age 52 years or older                           [phone], Moderate sedation services; each additional                            15 minutes intraservice time                           [phone], Moderate sedation services; each additional                            15 minutes intraservice time Diagnosis Code(s):        --- Professional ---                           D12.2, Benign neoplasm of ascending colon                           K64.8, Other hemorrhoids                           K62.1, Rectal polyp                           Z86.010, Personal history of colonic polyps                           K57.30, Diverticulosis of large intestine without                            perforation or abscess without bleeding CPT copyright 2016 [employer]. All rights reserved. The codes documented in this report are preliminary and upon coder review may  be revised to meet current  compliance requirements. Barney Drain, MD Barney Drain MD, MD 03/29/2017 12:38:53 PM This report has been signed electronically. Number of Addenda: 0

## 2017-04-02 NOTE — Progress Notes (Signed)
Cardiology Office Note   Date:  04/08/2017   ID:  LEIGHA OLBERDING, DOB March 20, 1951, MRN [id number]  PCP:  Redmond School, MD  Cardiologist:   Jenkins Rouge, MD   No chief complaint on file.     History of Present Illness: Tamara Thompson is a 66 y.o. female who presents for f/u of murmur. She indicates Dr Gerarda Fraction Mentioned this 2 years ago but not last year. Last visit in setting of anemia noted again. No cardiac symptoms No dyspnea, palpitations, chest pain or syncope. 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  Active walking no limitations.    No history of connective tissue dx No history of rheumatic heart dx No history of Fen/Phen diet pill use   Echo reviewed 10/26/16 EF 55-60% mild TR no significant valve disease Grade on diastolic dysfunction   Had TIA with slurred speech and facial droop July 2018 Acute left basal ganlia hematoma   Also had seen GI for colonoscopy and elevated LFT;s fatty liver  10 mm sessile polyp removed from ascending colon Benign  No cardiac symptoms had neurologic f/u for CVA    Past Medical History:  Diagnosis Date  . Anemia   . Bleeding from the nose   . Deviated septum   . Diabetes mellitus without complication ([place])   . Kidney stones   . Medical history non-contributory   . Stroke [facility])     Past Surgical History:  Procedure Laterality Date  . ANKLE SURGERY  2010   left  . CESAREAN SECTION    . COLONOSCOPY N/A 12/14/2013   Dr. Oneida Alar: 10 colon polyps removed, ascending colon sessile serrated adenoma, hyperplastic polyps. Moderate diverticulosis throughout colon. Left colon redundant  . IR ANGIO INTRA EXTRACRAN SEL COM CAROTID INNOMINATE BILAT MOD SED  01/29/2017  . IR ANGIO VERTEBRAL SEL VERTEBRAL BILAT MOD SED  01/29/2017  . IR RADIOLOGIST EVAL & MGMT  01/17/2017  . NASAL ENDOSCOPY WITH EPISTAXIS CONTROL Bilateral 02/07/2015   Procedure: BILATERAL NASAL ENDOSCOPIC CAUTERIZATION;  Surgeon: Leta Baptist, MD;   Location: [place];  Service: ENT;  Laterality: Bilateral;  . NASAL SEPTOPLASTY W/ TURBINOPLASTY Bilateral 02/02/2014   Procedure: RIGHT NASAL SEPTOPLASTY WITH TURBINATE REDUCTION;  Surgeon: Ascencion Dike, MD;  Location: [place];  Service: ENT;  Laterality: Bilateral;  . OVARIAN CYST SURGERY    . POLYPECTOMY Right 02/02/2014   Procedure: RIGHT POLYPECTOMY NASAL;  Surgeon: Ascencion Dike, MD;  Location: [place];  Service: ENT;  Laterality: Right;  . Surgery of jaw and cheek fracture       Current Outpatient Medications  Medication Sig Dispense Refill  . acetaminophen (TYLENOL) 500 MG tablet Take 1,000 mg by mouth daily as needed for moderate pain or headache.    . Cholecalciferol 2000 units TABS Take 2,000 Units by mouth daily.     . Coenzyme Q10 (CO Q 10) 100 MG CAPS Take 100 mg by mouth daily.     Marland Kitchen ezetimibe (ZETIA) 10 MG tablet Take 10 mg by mouth daily.    . ferrous sulfate 325 (65 FE) MG tablet Take 325 mg by mouth 3 (three) times daily.     Javier Docker Oil 300 MG CAPS Take 300 mg by mouth daily.     . metFORMIN (GLUCOPHAGE) 500 MG tablet Take 500 mg by mouth 2 (two) times daily.     No current facility-administered medications for this visit.  Allergies:   Penicillins; Statins; and Percocet [oxycodone-acetaminophen]    Social History:  The patient  reports that she quit smoking about 7 years ago. Her smoking use included cigarettes. She has a 62.50 pack-year smoking history. she has never used smokeless tobacco. She reports that she drinks alcohol. She reports that she does not use drugs.   Family History:  The patient's family history includes Cancer in her father; Heart disease in her mother; Stroke in her father.    ROS:  Please see the history of present illness.   Otherwise, review of systems are positive for none.   All other systems are reviewed and negative.    PHYSICAL EXAM: VS:  BP (!) 144/84   Pulse (!) 107   Ht 5' 5"   (1.651 m)   Wt 178 lb 9.6 oz (81 kg)   SpO2 98% Comment: on room air  BMI 29.72 kg/m  , BMI Body mass index is 29.72 kg/m. Affect appropriate Healthy:  appears stated age [place] Accent  HEENT: normal Neck supple with no adenopathy JVP normal no bruits no thyromegaly Lungs clear with no wheezing and good diaphragmatic motion Heart:  S1/S2 grade one SEM  murmur, no rub, gallop or click PMI normal Abdomen: benighn, BS positve, no tenderness, no AAA no bruit.  No HSM or HJR Distal pulses intact with no bruits No edema Neuro non-focal Skin warm and dry No muscular weakness     EKG:   10/25/16 SR rate 79 low voltage normal ST segments    Recent Labs: 01/18/2017: ALT 92 01/29/2017: BUN 12; Creatinine, Ser 0.71; Hemoglobin 13.3; Platelets 280; Potassium 4.0; Sodium 139    Lipid Panel    Component Value Date/Time   CHOL 248 (H) 10/25/2016 1920   TRIG 208 (H) 10/25/2016 1920   HDL 47 10/25/2016 1920   CHOLHDL 5.3 10/25/2016 1920   VLDL 42 (H) 10/25/2016 1920   LDLCALC 159 (H) 10/25/2016 1920      Wt Readings from Last 3 Encounters:  04/08/17 178 lb 9.6 oz (81 kg)  03/29/17 171 lb (77.6 kg)  02/20/17 175 lb 3.2 oz (79.5 kg)      Other studies Reviewed: Additional studies/ records that were reviewed today include: Notes Dr Gerarda Fraction Labs echo and ECG     ASSESSMENT AND PLAN:  1.  Murmur: benign sounding AV sclerosis No need for SBE echo 10/26/16 no significant valvular disease  2. Anemia: improved 3. Polyp benign f/u GI will need f/u colonoscopy had had 2 polyps removed now 4. CVA:  No indication for loop with brainstem hematoma f/u neurology    Current medicines are reviewed at length with the patient today.  The patient does not have concerns regarding medicines.  The following changes have been made:  no change  Labs/ tests ordered today include: None No orders of the defined types were placed in this encounter.    Disposition:   FU with me in a year       Signed, Jenkins Rouge, MD  04/08/2017 2:10 PM    [facility] [address], [place], [place]  [address] Phone: [phone]; Fax: [phone]

## 2017-04-03 ENCOUNTER — Other Ambulatory Visit: Payer: Self-pay | Admitting: Radiology

## 2017-04-03 ENCOUNTER — Ambulatory Visit (HOSPITAL_COMMUNITY)
Admission: RE | Admit: 2017-04-03 | Discharge: 2017-04-03 | Disposition: A | Discharge status: Home / Self Care | Payer: Medicare Other | Source: Ambulatory Visit | Attending: Interventional Radiology | Admitting: Interventional Radiology

## 2017-04-03 DIAGNOSIS — I729 Aneurysm of unspecified site: Secondary | ICD-10-CM

## 2017-04-03 NOTE — Progress Notes (Signed)
Pt is aware.  

## 2017-04-08 ENCOUNTER — Ambulatory Visit (INDEPENDENT_AMBULATORY_CARE_PROVIDER_SITE_OTHER): Payer: Medicare Other | Admitting: Cardiovascular Disease

## 2017-04-08 ENCOUNTER — Encounter: Payer: Self-pay | Admitting: Cardiovascular Disease

## 2017-04-08 ENCOUNTER — Other Ambulatory Visit: Payer: Self-pay | Admitting: Radiology

## 2017-04-08 VITALS — BP 144/84 | HR 107 | Ht 65.0 in | Wt 178.6 lb

## 2017-04-08 DIAGNOSIS — R011 Cardiac murmur, unspecified: Secondary | ICD-10-CM | POA: Diagnosis not present

## 2017-04-08 DIAGNOSIS — I639 Cerebral infarction, unspecified: Secondary | ICD-10-CM | POA: Diagnosis not present

## 2017-04-08 NOTE — Patient Instructions (Signed)

## 2017-04-09 ENCOUNTER — Encounter (HOSPITAL_COMMUNITY): Payer: Self-pay

## 2017-04-09 ENCOUNTER — Ambulatory Visit (HOSPITAL_COMMUNITY)
Admission: RE | Admit: 2017-04-09 | Discharge: 2017-04-09 | Disposition: A | Discharge status: Home / Self Care | Payer: Medicare Other | Source: Ambulatory Visit | Attending: Gastroenterology | Admitting: Gastroenterology

## 2017-04-09 DIAGNOSIS — Z79899 Other long term (current) drug therapy: Secondary | ICD-10-CM | POA: Insufficient documentation

## 2017-04-09 DIAGNOSIS — R7989 Other specified abnormal findings of blood chemistry: Secondary | ICD-10-CM

## 2017-04-09 DIAGNOSIS — Z9889 Other specified postprocedural states: Secondary | ICD-10-CM | POA: Diagnosis not present

## 2017-04-09 DIAGNOSIS — Z823 Family history of stroke: Secondary | ICD-10-CM | POA: Insufficient documentation

## 2017-04-09 DIAGNOSIS — Z8249 Family history of ischemic heart disease and other diseases of the circulatory system: Secondary | ICD-10-CM | POA: Insufficient documentation

## 2017-04-09 DIAGNOSIS — Z7984 Long term (current) use of oral hypoglycemic drugs: Secondary | ICD-10-CM | POA: Insufficient documentation

## 2017-04-09 DIAGNOSIS — Z87891 Personal history of nicotine dependence: Secondary | ICD-10-CM | POA: Diagnosis not present

## 2017-04-09 DIAGNOSIS — Z885 Allergy status to narcotic agent status: Secondary | ICD-10-CM | POA: Insufficient documentation

## 2017-04-09 DIAGNOSIS — R768 Other specified abnormal immunological findings in serum: Secondary | ICD-10-CM

## 2017-04-09 DIAGNOSIS — Z801 Family history of malignant neoplasm of trachea, bronchus and lung: Secondary | ICD-10-CM | POA: Insufficient documentation

## 2017-04-09 DIAGNOSIS — Z880 Allergy status to penicillin: Secondary | ICD-10-CM | POA: Diagnosis not present

## 2017-04-09 DIAGNOSIS — E119 Type 2 diabetes mellitus without complications: Secondary | ICD-10-CM | POA: Insufficient documentation

## 2017-04-09 DIAGNOSIS — Z8673 Personal history of transient ischemic attack (TIA), and cerebral infarction without residual deficits: Secondary | ICD-10-CM | POA: Diagnosis not present

## 2017-04-09 DIAGNOSIS — D649 Anemia, unspecified: Secondary | ICD-10-CM | POA: Insufficient documentation

## 2017-04-09 DIAGNOSIS — K7581 Nonalcoholic steatohepatitis (NASH): Secondary | ICD-10-CM | POA: Insufficient documentation

## 2017-04-09 DIAGNOSIS — Z87442 Personal history of urinary calculi: Secondary | ICD-10-CM | POA: Diagnosis not present

## 2017-04-09 DIAGNOSIS — Z888 Allergy status to other drugs, medicaments and biological substances status: Secondary | ICD-10-CM | POA: Insufficient documentation

## 2017-04-09 DIAGNOSIS — R945 Abnormal results of liver function studies: Secondary | ICD-10-CM

## 2017-04-09 DIAGNOSIS — Z800 Family history of malignant neoplasm of digestive organs: Secondary | ICD-10-CM | POA: Diagnosis not present

## 2017-04-09 LAB — GLUCOSE, CAPILLARY
GLUCOSE-CAPILLARY: 83 mg/dL (ref 65–99)
Glucose-Capillary: 76 mg/dL (ref 65–99)

## 2017-04-09 LAB — PROTIME-INR
INR: 0.95
Prothrombin Time: 12.6 seconds (ref 11.4–15.2)

## 2017-04-09 LAB — CBC
HEMATOCRIT: 39.4 % (ref 36.0–46.0)
HEMOGLOBIN: 12.9 g/dL (ref 12.0–15.0)
MCH: 29.9 pg (ref 26.0–34.0)
MCHC: 32.7 g/dL (ref 30.0–36.0)
MCV: 91.2 fL (ref 78.0–100.0)
PLATELETS: 291 10*3/uL (ref 150–400)
RBC: 4.32 MIL/uL (ref 3.87–5.11)
RDW: 14.2 % (ref 11.5–15.5)
WBC: 6.1 10*3/uL (ref 4.0–10.5)

## 2017-04-09 LAB — APTT: aPTT: 28 seconds (ref 24–36)

## 2017-04-09 MED ORDER — LIDOCAINE HCL (PF) 1 % IJ SOLN
INTRAMUSCULAR | Status: AC
  Filled 2017-04-09: qty 10

## 2017-04-09 MED ORDER — MIDAZOLAM HCL 2 MG/2ML IJ SOLN
INTRAMUSCULAR | Status: AC | PRN
  Administered 2017-04-09 (×2): 1 mg via INTRAVENOUS

## 2017-04-09 MED ORDER — MIDAZOLAM HCL 2 MG/2ML IJ SOLN
INTRAMUSCULAR | Status: AC
  Filled 2017-04-09: qty 4

## 2017-04-09 MED ORDER — FENTANYL CITRATE (PF) 100 MCG/2ML IJ SOLN
INTRAMUSCULAR | Status: AC
  Filled 2017-04-09: qty 2

## 2017-04-09 MED ORDER — SODIUM CHLORIDE 0.9 % IV SOLN: INTRAVENOUS | Status: DC

## 2017-04-09 MED ORDER — GELATIN ABSORBABLE 12-7 MM EX MISC
CUTANEOUS | Status: DC
Start: 2017-04-09 — End: 2017-04-10
  Filled 2017-04-09: qty 1

## 2017-04-09 MED ORDER — FENTANYL CITRATE (PF) 100 MCG/2ML IJ SOLN
INTRAMUSCULAR | Status: AC | PRN
  Administered 2017-04-09 (×2): 50 ug via INTRAVENOUS

## 2017-04-09 NOTE — Sedation Documentation (Signed)
Bandaid R flank intact

## 2017-04-09 NOTE — H&P (Signed)
Chief Complaint: Patient was seen in consultation today for random core liver biopsy at the request of Tamara Thompson  Referring Physician(s): Tamara Thompson  Supervising Physician: Tamara Thompson  Patient Status: Tamara Thompson  History of Present Illness: Tamara Thompson is a 66 y.o. female    Was seen for routine colonoscopy (performed 03/29/17) Blood work revealed elevated liver functions Repeat labs still elevated  Chart shows evidence of elevated functions known approx 1 yr ago [place] Abd 03/07/16 CLINICAL DATA:  Elevated liver function studies. History of anemia, renal cysts and stones, fatty liver disease. IMPRESSION: 1. Bilateral nonobstructing kidney stones. Bilateral simple appearing cysts in both kidneys. There is a septated lower pole cyst in the left kidney which is stable. 2. Fatty infiltrative change of the liver. No gallstones or evidence of acute cholecystitis. 3. Ectatic abdominal aorta. Maximal diameter proximally is 3.1 mm. Recommend followup by ultrasound in 3 years.   Past Medical History:  Diagnosis Date  . Anemia   . Bleeding from the nose   . Deviated septum   . Diabetes mellitus without complication ([place])   . Kidney stones   . Medical history non-contributory   . Stroke [facility])     Past Surgical History:  Procedure Laterality Date  . ANKLE SURGERY  2010   left  . CESAREAN SECTION    . COLONOSCOPY N/A 12/14/2013   Dr. Oneida Alar: 10 colon polyps removed, ascending colon sessile serrated adenoma, hyperplastic polyps. Moderate diverticulosis throughout colon. Left colon redundant  . IR ANGIO INTRA EXTRACRAN SEL COM CAROTID INNOMINATE BILAT MOD SED  01/29/2017  . IR ANGIO VERTEBRAL SEL VERTEBRAL BILAT MOD SED  01/29/2017  . IR RADIOLOGIST EVAL & MGMT  01/17/2017  . NASAL ENDOSCOPY WITH EPISTAXIS CONTROL Bilateral 02/07/2015   Procedure: BILATERAL NASAL ENDOSCOPIC CAUTERIZATION;  Surgeon: Leta Baptist, MD;  Location: [place];  Service: ENT;   Laterality: Bilateral;  . NASAL SEPTOPLASTY W/ TURBINOPLASTY Bilateral 02/02/2014   Procedure: RIGHT NASAL SEPTOPLASTY WITH TURBINATE REDUCTION;  Surgeon: Ascencion Dike, MD;  Location: [place];  Service: ENT;  Laterality: Bilateral;  . OVARIAN CYST SURGERY    . POLYPECTOMY Right 02/02/2014   Procedure: RIGHT POLYPECTOMY NASAL;  Surgeon: Ascencion Dike, MD;  Location: [place];  Service: ENT;  Laterality: Right;  . Surgery of jaw and cheek fracture      Allergies: Penicillins; Statins; and Percocet [oxycodone-acetaminophen]  Medications: Prior to Admission medications   Medication Sig Start Date End Date Taking? Authorizing Provider  acetaminophen (TYLENOL) 500 MG tablet Take 1,000 mg by mouth daily as needed for moderate pain or headache.   Yes [provider]  Cholecalciferol 2000 units TABS Take 2,000 Units by mouth daily.    Yes [provider]  Coenzyme Q10 (CO Q 10) 100 MG CAPS Take 100 mg by mouth daily.    Yes [provider]  ezetimibe (ZETIA) 10 MG tablet Take 10 mg by mouth daily. 11/02/16  Yes [provider]  ferrous sulfate 325 (65 FE) MG tablet Take 325 mg by mouth 3 (three) times daily.    Yes [provider]  Javier Docker Oil 300 MG CAPS Take 300 mg by mouth daily.    Yes [provider]  metFORMIN (GLUCOPHAGE) 500 MG tablet Take 500 mg by mouth 2 (two) times daily. 11/02/16  Yes [provider]     Family History  Problem Relation Age of Onset  . Heart disease Mother   .  Cancer Father        lung  . Stroke Father   . Colon cancer Neg Hx     Social History   Socioeconomic History  . Marital status: Married    Spouse name: None  . Number of children: None  . Years of education: None  . Highest education level: None  Social Thompson  . Financial resource strain: None  . Food insecurity - worry: None  . Food insecurity - inability: None  . Transportation Thompson - medical: None  .  Transportation Thompson - non-medical: None  Occupational History  . Occupation: housewife  Tobacco Use  . Smoking status: Former Smoker    Packs/day: 1.25    Years: 50.00    Pack years: 62.50    Types: Cigarettes    Last attempt to quit: 01/27/2010    Years since quitting: 7.2  . Smokeless tobacco: Never Used  Substance and Sexual Activity  . Alcohol use: Yes    Comment: rarely, not in past few months  . Drug use: No  . Sexual activity: Not Currently    Birth control/protection: Post-menopausal  Other Topics Concern  . None  Social History Narrative  . None    Review of Systems: A 12 point ROS discussed and pertinent positives are indicated in the HPI above.  All other systems are negative.  Review of Systems  Constitutional: Negative for activity change, fatigue and fever.  Respiratory: Negative for shortness of breath.   Cardiovascular: Negative for chest pain and leg swelling.  Gastrointestinal: Negative for abdominal pain and nausea.  Musculoskeletal: Negative for gait problem.  Neurological: Negative for weakness.  Psychiatric/Behavioral: Negative for behavioral problems and confusion.    Vital Signs: BP (!) 140/96 (BP Location: Right Arm)   Pulse 77   Temp 97.6 F (36.4 C) (Oral)   Ht 5' 5"  (1.651 m)   Wt 178 lb (80.7 kg)   SpO2 100%   BMI 29.62 kg/m   Physical Exam  Constitutional: She is oriented to person, place, and time.  Cardiovascular: Normal rate, regular rhythm and normal heart sounds.  Pulmonary/Chest: Effort normal and breath sounds normal.  Abdominal: Soft. Bowel sounds are normal.  Musculoskeletal: Normal range of motion.  Neurological: She is alert and oriented to person, place, and time.  Skin: Skin is warm and dry.  Psychiatric: She has a normal mood and affect. Her behavior is normal. Judgment and thought content normal.  Nursing note and vitals reviewed.   Imaging: No results found.  Labs:  CBC: Recent Labs    10/25/16 1920  10/25/16 1925 10/26/16 0700 10/27/16 0524 01/29/17 0949  WBC 6.3  --  5.7 5.8 5.2  HGB 12.6 13.3 12.1 12.4 13.3  HCT 37.5 39.0 36.5 39.1 40.9  PLT 259  --  212 250 280    COAGS: Recent Labs    10/25/16 1920 01/29/17 0949  INR 0.93 1.00  APTT 26 28    BMP: Recent Labs    10/25/16 1920 10/25/16 1925 10/26/16 0700 10/27/16 0524 01/29/17 0949  NA 134* 139 137 139 139  K 3.7 3.8 3.8 3.8 4.0  CL 103 103 108 109 105  CO2 20*  --  24 24 24   GLUCOSE 186* 187* 199* 149* 131*  BUN 15 15 9 8 12   CALCIUM 9.2  --  8.3* 8.7* 9.1  CREATININE 0.72 0.70 0.73 0.69 0.71  GFRNONAA >60  --  >60 >60 >60  GFRAA >60  --  >60 >60 >  60    LIVER FUNCTION TESTS: Recent Labs    10/25/16 1920 10/26/16 0700 01/18/17 1230  BILITOT 0.4 0.5 0.3  AST 63* 49* 45*  ALT 134* 107* 92*  ALKPHOS 66 56  --   PROT 7.3 6.2* 7.0  ALBUMIN 4.1 3.4*  --     TUMOR MARKERS: No results for input(s): AFPTM, CEA, CA199, CHROMGRNA in the last 8760 hours.  Assessment and Plan:  Persistent elevated liver functions Fatty liver Scheduled for liver core biopsy Risks and benefits discussed with the patient including, but not limited to bleeding, infection, damage to adjacent structures or low yield requiring additional tests. All of the patient's questions were answered, patient is agreeable to proceed. Consent signed and in chart.   Thank you for this interesting consult.  I greatly enjoyed meeting Tamara Thompson and look forward to participating in their care.  A copy of this report was sent to the requesting provider on this date.  Electronically Signed: Lavonia Drafts, PA-C 04/09/2017, 12:09 PM   I spent a total of  30 Minutes   in face to face in clinical consultation, greater than 50% of which was counseling/coordinating care for liver core bx

## 2017-04-09 NOTE — Sedation Documentation (Signed)
Patient is resting comfortably. 

## 2017-04-09 NOTE — Discharge Instructions (Signed)
Liver Biopsy, Care After These instructions give you information on caring for yourself after your procedure. Your doctor may also give you more specific instructions. Call your doctor if you have any problems or questions after your procedure. Follow these instructions at home:  Rest at home for 1-2 days or as told by your doctor.  Have someone stay with you for at least 24 hours.  Do not do these things in the first 24 hours: ? Drive. ? Use machinery. ? Take care of other people. ? Sign legal documents. ? Take a bath or shower.  There are many different ways to close and cover a cut (incision). For example, a cut can be closed with stitches, skin glue, or adhesive strips. Follow your doctor's instructions on: ? Taking care of your cut. ? Changing and removing your bandage (dressing). ? Removing whatever was used to close your cut.  Do not drink alcohol in the first week.  Do not lift more than 5 pounds or play contact sports for the first 2 weeks.  Take medicines only as told by your doctor. For 1 week, do not take medicine that has aspirin in it or medicines like ibuprofen.  Get your test results. Contact a doctor if:  A cut bleeds and leaves more than just a small spot of blood.  A cut is red, puffs up (swells), or hurts more than before.  Fluid or something else comes from a cut.  A cut smells bad.  You have a fever or chills. Get help right away if:  You have swelling, bloating, or pain in your belly (abdomen).  You get dizzy or faint.  You have a rash.  You feel sick to your stomach (nauseous) or throw up (vomit).  You have trouble breathing, feel short of breath, or feel faint.  Your chest hurts.  You have problems talking or seeing.  You have trouble balancing or moving your arms or legs. This information is not intended to replace advice given to you by your health care provider. Make sure you discuss any questions you have with your health care  provider. Document Released: 01/17/2008 Document Revised: 09/15/2015 Document Reviewed: 06/05/2013 Elsevier Interactive Patient Education  [employer].

## 2017-04-09 NOTE — Procedures (Signed)
inc'd LFTs  S/p US LIVER RANDOM BX  NO COMP STABLE PATH PENDING FULL REPORT IN PACS

## 2017-04-12 ENCOUNTER — Ambulatory Visit (HOSPITAL_COMMUNITY)
Admission: RE | Admit: 2017-04-12 | Discharge: 2017-04-12 | Disposition: A | Discharge status: Home / Self Care | Payer: Medicare Other | Source: Ambulatory Visit | Attending: Interventional Radiology | Admitting: Interventional Radiology

## 2017-04-12 DIAGNOSIS — R930 Abnormal findings on diagnostic imaging of skull and head, not elsewhere classified: Secondary | ICD-10-CM | POA: Diagnosis not present

## 2017-04-12 HISTORY — PX: IR RADIOLOGIST EVAL & MGMT: IMG5224

## 2017-04-17 ENCOUNTER — Encounter (HOSPITAL_COMMUNITY): Payer: Self-pay | Admitting: Interventional Radiology

## 2017-04-18 DIAGNOSIS — Z1389 Encounter for screening for other disorder: Secondary | ICD-10-CM | POA: Diagnosis not present

## 2017-04-18 DIAGNOSIS — I10 Essential (primary) hypertension: Secondary | ICD-10-CM | POA: Diagnosis not present

## 2017-04-18 DIAGNOSIS — E663 Overweight: Secondary | ICD-10-CM | POA: Diagnosis not present

## 2017-04-18 DIAGNOSIS — Z6829 Body mass index (BMI) 29.0-29.9, adult: Secondary | ICD-10-CM | POA: Diagnosis not present

## 2017-04-18 DIAGNOSIS — E119 Type 2 diabetes mellitus without complications: Secondary | ICD-10-CM | POA: Diagnosis not present

## 2017-04-22 ENCOUNTER — Telehealth: Payer: Self-pay | Admitting: Gastroenterology

## 2017-04-22 NOTE — Telephone Encounter (Signed)
PLEASE CALL PT. HER LIVER ENZYMES ARE MILDLY ELEVATED DUE TO FAT IN HER LIVER. SHE SHOULD LOSE TEN POUNDS AND AVOID DRINKS WITH HIGH FRUCTOSE CORN SYRUP. NEXT OPV in JAN 2019. REPEAT LIVER PANEL IN SEP 2019.

## 2017-04-22 NOTE — Telephone Encounter (Signed)
OV made and reminder in epic

## 2017-04-23 DIAGNOSIS — I639 Cerebral infarction, unspecified: Secondary | ICD-10-CM

## 2017-04-23 HISTORY — DX: Cerebral infarction, unspecified: I63.9

## 2017-04-24 ENCOUNTER — Telehealth: Payer: Self-pay

## 2017-04-24 ENCOUNTER — Other Ambulatory Visit: Payer: Self-pay

## 2017-04-24 DIAGNOSIS — R7989 Other specified abnormal findings of blood chemistry: Secondary | ICD-10-CM

## 2017-04-24 DIAGNOSIS — R945 Abnormal results of liver function studies: Principal | ICD-10-CM

## 2017-04-24 NOTE — Telephone Encounter (Signed)
Returning call to [place]. [phone]

## 2017-04-24 NOTE — Telephone Encounter (Signed)
See result note.  

## 2017-04-24 NOTE — Telephone Encounter (Signed)
LMOM for a return call.  

## 2017-04-24 NOTE — Telephone Encounter (Signed)
Pt is aware and lab order on file for 12/2017.

## 2017-04-29 ENCOUNTER — Encounter: Payer: Self-pay | Admitting: Neurology

## 2017-04-29 ENCOUNTER — Ambulatory Visit (INDEPENDENT_AMBULATORY_CARE_PROVIDER_SITE_OTHER): Payer: Medicare Other | Admitting: Neurology

## 2017-04-29 VITALS — BP 130/84 | HR 89 | Wt 176.4 lb

## 2017-04-29 DIAGNOSIS — Q282 Arteriovenous malformation of cerebral vessels: Secondary | ICD-10-CM | POA: Diagnosis not present

## 2017-04-29 DIAGNOSIS — I780 Hereditary hemorrhagic telangiectasia: Secondary | ICD-10-CM | POA: Insufficient documentation

## 2017-04-29 DIAGNOSIS — E1159 Type 2 diabetes mellitus with other circulatory complications: Secondary | ICD-10-CM

## 2017-04-29 DIAGNOSIS — Z87891 Personal history of nicotine dependence: Secondary | ICD-10-CM | POA: Diagnosis not present

## 2017-04-29 DIAGNOSIS — I610 Nontraumatic intracerebral hemorrhage in hemisphere, subcortical: Secondary | ICD-10-CM | POA: Diagnosis not present

## 2017-04-29 DIAGNOSIS — E782 Mixed hyperlipidemia: Secondary | ICD-10-CM | POA: Diagnosis not present

## 2017-04-29 NOTE — Progress Notes (Signed)
STROKE NEUROLOGY FOLLOW UP NOTE  NAME: Tamara Thompson DOB: 1950/12/19  REASON FOR VISIT: stroke follow up HISTORY FROM: pt and chart  Today we had the pleasure of seeing Tamara Thompson in follow-up at our Neurology Clinic. Pt was accompanied by daughter.   History Summary Tamara Thompson a 67 y.o.femalewith history of anemia on iron pills, epistaxis due to deviated septum s/p cauterization, and former smoker admitted on 10/25/16 for acute onset of slurred speech, right arm weakness. CT head showed left BG small ICH. MRI confirmed left inferior BG hematoma. CTA head and neck showed stable hematoma, no ACOM aneurysm but distal right A2 segment 4x5 mm saccular aneurysm or small AVM. TTE EF 55-60%, LDL 159 and A1C 7.2. No Hx of HTN and BP well control during hospitalization, but found to have high glucose 140-200 range, diagnosed with DM. She was discharged lipitor and outpt PT/OT.  Follow up 01/07/17 - the patient has been doing well. Neuro back to baseline. Pt is on metformin now for DM, at home glucose 130s. Not tolerating statins in the past due to rashes, now on zetia. No hx of HTN, today BP 134/87. Reviewed CT and MRI images with her, and she is willing to do angiogram to evaluate aneurysm. Discussed with Dr. Estanislado Pandy.    Interval History During the interval time, pt has been doing well. Stated that she is at baseline. She followed with Dr. 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 Recommend to repeat CTA head and neck in 6 months. BP today 130/84. Not check BP at home regularly but glucose at 130s, on metformin.   REVIEW OF SYSTEMS: Full 14 system review of systems performed and notable only for those listed below and in HPI above, all others are negative:  Constitutional:   Cardiovascular:    Ear/Nose/Throat:   Skin:  Eyes:   Respiratory:   Gastroitestinal:   Genitourinary:  Hematology/Lymphatic:   Endocrine:  Musculoskeletal:   Allergy/Immunology:   Neurological:   Psychiatric:  Sleep: frequent waking  The following represents the patient's updated allergies and side effects list: Allergies  Allergen Reactions  . Penicillins     Childhood allergy Has patient had a PCN reaction causing immediate rash, facial/tongue/throat swelling, SOB or lightheadedness with hypotension: Unknown Has patient had a PCN reaction causing severe rash involving mucus membranes or skin necrosis: Unknown Has patient had a PCN reaction that required hospitalization: Unknown Has patient had a PCN reaction occurring within the last 10 years: No If all of the above answers are "NO", then may proceed with Cephalosporin use.   . Statins Other (See Comments)    Broke out in rash all over body - mostly torso  . Percocet [Oxycodone-Acetaminophen] Rash    The neurologically relevant items on the patient's problem list were reviewed on today's visit.  Neurologic Examination  A problem focused neurological exam (12 or more points of the single system neurologic examination, vital signs counts as 1 point, cranial nerves count for 8 points) was performed.  Blood pressure 130/84, pulse 89, weight 176 lb 6.4 oz (80 kg).  General - Well nourished, well developed, in no apparent distress.  Ophthalmologic - Sharp disc margins OU.   Cardiovascular - Regular rate and rhythm with no murmur.  Mental Status -  Level of arousal and orientation to time, place,  and person were intact. Language including expression, naming, repetition, comprehension was assessed and found intact. Attention span and concentration were normal. Fund of Knowledge was assessed and was intact.  Cranial Nerves II - XII - II - Visual field intact OU. III, IV, VI - Extraocular movements intact. V - Facial sensation intact  bilaterally. VII - Facial movement intact bilaterally. VIII - Hearing & vestibular intact bilaterally. X - Palate elevates symmetrically. XI - Chin turning & shoulder shrug intact bilaterally. XII - Tongue protrusion intact.  Motor Strength - The patient's strength was normal in all extremities and pronator drift was absent.  Bulk was normal and fasciculations were absent.   Motor Tone - Muscle tone was assessed at the neck and appendages and was normal.  Reflexes - The patient's reflexes were 1+ in all extremities and she had no pathological reflexes.  Sensory - Light touch, temperature/pinprick, vibration and proprioception, and Romberg testing were assessed and were normal.    Coordination - The patient had normal movements in the hands and feet with no ataxia or dysmetria.  Tremor was absent.  Gait and Station - The patient's transfers, posture, gait, station, and turns were observed as normal.   Data reviewed: I personally reviewed the images and agree with the radiology interpretations.  Ct Angio Head and neck W and Wo Contrast 10/26/2016 1. Left inferior basal ganglia hemorrhage appears stable since the MRI yesterday, with NO CTA spot sign. And although the hemorrhage is in proximity to the left ICA terminus, no ICA aneurysm or regional vascular malformation is identified (see # 2).  2. There is a distal right ACA distal A2 segment 4 x 5 mm saccular aneurysm or small vascular malformation. This was occult on the MRI yesterday, favoring either an aneurysm or a slow flow malformation.  3. However, the anterior communicating artery is normal, no A-comm aneurysm.  4. Minimal atherosclerosis. No stenosis.  5. No acute findings in the neck or visible chest.   MRI  1. Moderate to severely motion degraded examination. 2. Acute LEFT inferior basal ganglia hematoma.  MRA  1. No emergent large vessel occlusion or severe stenosis on this moderately motion degraded examination. 2. 3  mm ACOM and 2 mm LEFT carotid terminus aneurysms versus motion artifact. Recommend CTA HEAD.   Ct Head Code Stroke Wo Contrast 10/25/2016 1. Acute LEFT basal ganglia 1.6 x 1.7 cm hematoma. No significant mass effect.  2. ASPECTS is 10.  TTE - Left ventricle: The cavity size was normal. Wall thickness was   normal. Systolic function was normal. The estimated ejection   fraction was in the range of 55% to 60%. Wall motion was normal;   there were no regional wall motion abnormalities. Doppler   parameters are consistent with abnormal left ventricular   relaxation (grade 1 diastolic dysfunction). Impressions: - Normal LV systolic function; mild diastolic dysfunction; mild TR.    Component     Latest Ref Rng & Units 10/25/2016 10/26/2016  Total Protein     6.5 - 8.1 g/dL  6.2 (L)  Albumin     3.5 - 5.0 g/dL  3.4 (L)  AST     15 - 41 U/L  49 (H)  ALT     14 - 54 U/L  107 (H)  Alkaline Phosphatase     38 - 126 U/L  56  Total Bilirubin     0.3 - 1.2 mg/dL  0.5  Bilirubin, Direct     0.1 - 0.5 mg/dL  <  0.1 (L)  Indirect Bilirubin     0.3 - 0.9 mg/dL  NOT CALCULATED  Cholesterol     0 - 200 mg/dL 248 (H)   Triglycerides     <150 mg/dL 208 (H)   HDL Cholesterol     >40 mg/dL 47   Total CHOL/HDL Ratio     RATIO 5.3   VLDL     0 - 40 mg/dL 42 (H)   LDL (calc)     0 - 99 mg/dL 159 (H)   Hemoglobin A1C     4.8 - 5.6 %  7.2 (H)  Mean Plasma Glucose     mg/dL  160    Assessment: As you may recall, she is a 67 y.o. Caucasian female with PMH of anemia, epistaxis due to deviated septum s/p cauterization, and former smoker admitted on 10/25/16 for left BG small ICH on CT. MRI confirmed left inferior BG hematoma. MRA concerning for ACOM aneurysm but CTA head and neck showed stable hematoma, no ACOM aneurysm but distal right A2 segment 4x5 mm saccular aneurysm or small AVM. TTE EF 55-60%, LDL 159 and A1C 7.2. No Hx of HTN and BP well control during hospitalization, but found to have high  glucose 140-200 range, diagnosed with DM. She was discharged lipitor and outpt PT/OT. During the interval time, the patient back to baseline. Pt is on metformin now for DM. Not tolerating statins in the past due to rashes, now on zetia. She followed with Dr. 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 Recommend to repeat CTA head and neck in 6 months.     Plan:  - continue zetia and metformin for cholesterol and diabetic control - follow up with Dr. Estanislado Pandy as instructed - check BP and glucose at home.  - Follow up with your primary care physician for stroke risk factor modification. Recommend maintain blood pressure goal <130/80, diabetes with hemoglobin A1c goal below 7.0% and lipids with LDL cholesterol goal below 70 mg/dL.  - diabetic diet and regular exercise - follow up as needed.  I spent more than 25 minutes of face to face time with the patient. Greater than 50% of time was spent in counseling and coordination of care. We reviewed neuro images, discussed DM and HLD management, and AVM follow up.  No orders of the defined types were placed in this encounter.   No orders of the defined types were placed in this encounter.   Patient Instructions  - continue zetia and metformin for cholesterol and diabetic control - follow up with Dr. Estanislado Pandy as instructed - check BP and glucose at home.  - Follow up with your primary care physician for stroke risk factor modification. Recommend maintain blood pressure goal <130/80, diabetes with hemoglobin A1c goal below 7.0% and lipids with LDL cholesterol goal below 70 mg/dL.  - diabetic diet and regular exercise - follow up as needed.   Rosalin Hawking, MD PhD [facility] [address], [place], [place] [address] [phone]

## 2017-04-29 NOTE — Patient Instructions (Signed)
-   continue zetia and metformin for cholesterol and diabetic control - follow up with Dr. Estanislado Pandy as instructed - check BP and glucose at home.  - Follow up with your primary care physician for stroke risk factor modification. Recommend maintain blood pressure goal <130/80, diabetes with hemoglobin A1c goal below 7.0% and lipids with LDL cholesterol goal below 70 mg/dL.  - diabetic diet and regular exercise - follow up as needed.

## 2017-05-23 ENCOUNTER — Ambulatory Visit (INDEPENDENT_AMBULATORY_CARE_PROVIDER_SITE_OTHER): Payer: Medicare Other | Admitting: Gastroenterology

## 2017-05-23 ENCOUNTER — Encounter: Payer: Self-pay | Admitting: Gastroenterology

## 2017-05-23 VITALS — BP 132/83 | HR 89 | Temp 97.5°F | Ht 65.0 in | Wt 179.2 lb

## 2017-05-23 DIAGNOSIS — Z86010 Personal history of colonic polyps: Secondary | ICD-10-CM | POA: Diagnosis not present

## 2017-05-23 DIAGNOSIS — K7581 Nonalcoholic steatohepatitis (NASH): Secondary | ICD-10-CM | POA: Insufficient documentation

## 2017-05-23 NOTE — Assessment & Plan Note (Signed)
With elevated transaminases. Thorough evaluation on file to include serologies and liver biopsy. Repeat LFTs in Sept 2019. Return in Sept 2019. Discussed dietary and behavior modification.

## 2017-05-23 NOTE — Progress Notes (Signed)
cc'ed to pcp °

## 2017-05-23 NOTE — Progress Notes (Addendum)
REVIEWED-NO ADDITIONAL RECOMMENDATIONS.  Primary Care Physician:  Redmond School, MD Primary GI: Dr. Oneida Alar   Chief Complaint  Patient presents with  . elevated lft's    HPI:   Tamara Thompson is a 67 y.o. female presenting today with a history of colon polyps, with recent colonoscopy completed and found seven serrated adenomas. Due for surveillance in 3 years. Due to elevated transaminases, thorough evaluation to include serologies was undertaken. Had positive ASMA and ANA. Liver biopsy with mild steatohepatitis on liver biopsy. Due for repeat LFTs in Sept 2019.   No abdominal pain, N/V, GERD, dysphagia, rectal bleeding, changes in bowel habits. Has no questions or concerns regarding liver biopsy. Knows she needs to lose weight. Relieved to know what is going on. Making good choices. No alcohol intake.   Past Medical History:  Diagnosis Date  . Anemia   . Bleeding from the nose   . Deviated septum   . Diabetes mellitus without complication ([place])   . Kidney stones   . Medical history non-contributory   . Stroke [facility])     Past Surgical History:  Procedure Laterality Date  . ANKLE SURGERY  2010   left  . CESAREAN SECTION    . COLONOSCOPY N/A 12/14/2013   Dr. Oneida Alar: 10 colon polyps removed, ascending colon sessile serrated adenoma, hyperplastic polyps. Moderate diverticulosis throughout colon. Left colon redundant  . COLONOSCOPY N/A 03/29/2017   Dr. Oneida Alar: multiple serrated adenomas. Surveillance in 3 years   . IR ANGIO INTRA EXTRACRAN SEL COM CAROTID INNOMINATE BILAT MOD SED  01/29/2017  . IR ANGIO VERTEBRAL SEL VERTEBRAL BILAT MOD SED  01/29/2017  . IR RADIOLOGIST EVAL & MGMT  01/17/2017  . IR RADIOLOGIST EVAL & MGMT  04/12/2017  . NASAL ENDOSCOPY WITH EPISTAXIS CONTROL Bilateral 02/07/2015   Procedure: BILATERAL NASAL ENDOSCOPIC CAUTERIZATION;  Surgeon: Leta Baptist, MD;  Location: [facility];  Service: ENT;  Laterality: Bilateral;  . NASAL SEPTOPLASTY W/  TURBINOPLASTY Bilateral 02/02/2014   Procedure: RIGHT NASAL SEPTOPLASTY WITH TURBINATE REDUCTION;  Surgeon: Ascencion Dike, MD;  Location: [facility];  Service: ENT;  Laterality: Bilateral;  . OVARIAN CYST SURGERY    . POLYPECTOMY Right 02/02/2014   Procedure: RIGHT POLYPECTOMY NASAL;  Surgeon: Ascencion Dike, MD;  Location: [facility];  Service: ENT;  Laterality: Right;  . POLYPECTOMY  03/29/2017   Procedure: POLYPECTOMY;  Surgeon: Danie Binder, MD;  [facility];  Service: Endoscopy;;  . Surgery of jaw and cheek fracture      Current Outpatient Medications  Medication Sig Dispense Refill  . acetaminophen (TYLENOL) 500 MG tablet Take 1,000 mg by mouth daily as needed for moderate pain or headache.    . Cholecalciferol 2000 units TABS Take 2,000 Units by mouth daily.     . Coenzyme Q10 (CO Q 10) 100 MG CAPS Take 100 mg by mouth daily.     Marland Kitchen ezetimibe (ZETIA) 10 MG tablet Take 10 mg by mouth daily.    . ferrous sulfate 325 (65 FE) MG tablet Take 325 mg by mouth 3 (three) times daily.     Javier Docker Oil 300 MG CAPS Take 300 mg by mouth daily.     . metFORMIN (GLUCOPHAGE) 500 MG tablet Take 500 mg by mouth 2 (two) times daily.     No current facility-administered medications for this visit.     Allergies as of 05/23/2017 - Review Complete 05/23/2017  Allergen Reaction Noted  .  Penicillins  02/28/2012  . Statins Other (See Comments) 01/22/2017  . Percocet [oxycodone-acetaminophen] Rash 01/28/2015    Family History  Problem Relation Age of Onset  . Heart disease Mother   . Cancer Father        lung  . Stroke Father   . Colon cancer Neg Hx     Social History   Socioeconomic History  . Marital status: Married    Spouse name: None  . Number of children: None  . Years of education: None  . Highest education level: None  Social Needs  . Financial resource strain: None  . Food insecurity - worry: None  . Food insecurity - inability: None  .  Transportation needs - medical: None  . Transportation needs - non-medical: None  Occupational History  . Occupation: housewife  Tobacco Use  . Smoking status: Former Smoker    Packs/day: 1.25    Years: 50.00    Pack years: 62.50    Types: Cigarettes    Last attempt to quit: 01/27/2010    Years since quitting: 7.3  . Smokeless tobacco: Never Used  Substance and Sexual Activity  . Alcohol use: No    Frequency: Never  . Drug use: No  . Sexual activity: Not Currently    Birth control/protection: Post-menopausal  Other Topics Concern  . None  Social History Narrative  . None    Review of Systems: Gen: Denies fever, chills, anorexia. Denies fatigue, weakness, weight loss.  CV: Denies chest pain, palpitations, syncope, peripheral edema, and claudication. Resp: Denies dyspnea at rest, cough, wheezing, coughing up blood, and pleurisy. GI: see HPI  Derm: Denies rash, itching, dry skin Psych: Denies depression, anxiety, memory loss, confusion. No homicidal or suicidal ideation.  Heme: Denies bruising, bleeding, and enlarged lymph nodes.  Physical Exam: BP 132/83   Pulse 89   Temp (!) 97.5 F (36.4 C) (Oral)   Ht 5' 5"  (1.651 m)   Wt 179 lb 3.2 oz (81.3 kg)   BMI 29.82 kg/m  General:   Alert and oriented. No distress noted. Pleasant and cooperative.  Head:  Normocephalic and atraumatic. Eyes:  Conjuctiva clear without scleral icterus. Mouth:  Oral mucosa pink and moist. Abdomen:  +BS, soft, non-tender and non-distended. No rebound or guarding. No HSM or masses noted. Msk:  Symmetrical without gross deformities. Normal posture. Extremities:  Without edema. Neurologic:  Alert and  oriented x4 Psych:  Alert and cooperative. Normal mood and affect.

## 2017-05-23 NOTE — Patient Instructions (Addendum)
I am glad you are doing well!  We will see you in September 2019 and do liver numbers then. I have attached a diet sheet for tips.    Fatty Liver Fatty liver, also called hepatic steatosis or steatohepatitis, is a condition in which too much fat has built up in your liver cells. The liver removes harmful substances from your bloodstream. It produces fluids your body needs. It also helps your body use and store energy from the food you eat. In many cases, fatty liver does not cause symptoms or problems. It is often diagnosed when tests are being done for other reasons. However, over time, fatty liver can cause inflammation that may lead to more serious liver problems, such as scarring of the liver (cirrhosis). What are the causes? Causes of fatty liver may include:  Drinking too much alcohol.  Poor nutrition.  Obesity.  Cushing syndrome.  Diabetes.  Hyperlipidemia.  Pregnancy.  Certain drugs.  Poisons.  Some viral infections.  What increases the risk? You may be more likely to develop fatty liver if you:  Abuse alcohol.  Are pregnant.  Are overweight.  Have diabetes.  Have hepatitis.  Have a high triglyceride level.  What are the signs or symptoms? Fatty liver often does not cause any symptoms. In cases where symptoms develop, they can include:  Fatigue.  Weakness.  Weight loss.  Confusion.  Abdominal pain.  Yellowing of your skin and the white parts of your eyes (jaundice).  Nausea and vomiting.  How is this diagnosed? Fatty liver may be diagnosed by:  Physical exam and medical history.  Blood tests.  Imaging tests, such as an ultrasound, CT scan, or MRI.  Liver biopsy. A small sample of liver tissue is removed using a needle. The sample is then looked at under a microscope.  How is this treated? Fatty liver is often caused by other health conditions. Treatment for fatty liver may involve medicines and lifestyle changes to manage conditions  such as:  Alcoholism.  High cholesterol.  Diabetes.  Being overweight or obese.  Follow these instructions at home:  Eat a healthy diet as directed by your health care provider.  Exercise regularly. This can help you lose weight and control your cholesterol and diabetes. Talk to your health care provider about an exercise plan and which activities are best for you.  Do not drink alcohol.  Take medicines only as directed by your health care provider. Contact a health care provider if: You have difficulty controlling your:  Blood sugar.  Cholesterol.  Alcohol consumption.  Get help right away if:  You have abdominal pain.  You have jaundice.  You have nausea and vomiting. This information is not intended to replace advice given to you by your health care provider. Make sure you discuss any questions you have with your health care provider. Document Released: 05/25/2005 Document Revised: 09/15/2015 Document Reviewed: 08/19/2013 Elsevier Interactive Patient Education  2018 [place]. Nonalcoholic Fatty Liver Disease Diet Nonalcoholic fatty liver disease is a condition that causes fat to accumulate in and around the liver. The disease makes it harder for the liver to work the way that it should. Following a healthy diet can help to keep nonalcoholic fatty liver disease under control. It can also help to prevent or improve conditions that are associated with the disease, such as heart disease, diabetes, high blood pressure, and abnormal cholesterol levels. Along with regular exercise, this diet:  Promotes weight loss.  Helps to control blood sugar levels.  Helps to improve the way that the body uses insulin.  What do I need to know about this diet?  Use the glycemic index (GI) to plan your meals. The index tells you how quickly a food will raise your blood sugar. Choose low-GI foods. These foods take a longer time to raise blood sugar.  Keep track of how many calories  you take in. Eating the right amount of calories will help you to achieve a healthy weight.  You may want to follow a Mediterranean diet. This diet includes a lot of vegetables, lean meats or fish, whole grains, fruits, and healthy oils and fats. What foods can I eat? Grains Whole grains, such as whole-wheat or whole-grain breads, crackers, tortillas, cereals, and pasta. Stone-ground whole wheat. Pumpernickel bread. Unsweetened oatmeal. Bulgur. Barley. Quinoa. Brown or wild rice. Corn or whole-wheat flour tortillas. Vegetables Lettuce. Spinach. Peas. Beets. Cauliflower. Cabbage. Broccoli. Carrots. Tomatoes. Squash. Eggplant. Herbs. Peppers. Onions. Cucumbers. Brussels sprouts. Yams and sweet potatoes. Beans. Lentils. Fruits Bananas. Apples. Oranges. Grapes. Papaya. Mango. Pomegranate. Kiwi. Grapefruit. Cherries. Meats and Other Protein Sources Seafood and shellfish. Lean meats. Poultry. Tofu. Dairy Low-fat or fat-free dairy products, such as yogurt, cottage cheese, and cheese. Beverages Water. Sugar-free drinks. Tea. Coffee. Low-fat or skim milk. Milk alternatives, such as soy or almond milk. Real fruit juice. Condiments Mustard. Relish. Low-fat, low-sugar ketchup and barbecue sauce. Low-fat or fat-free mayonnaise. Sweets and Desserts Sugar-free sweets. Fats and Oils Avocado. Canola or olive oil. Nuts and nut butters. Seeds. The items listed above may not be a complete list of recommended foods or beverages. Contact your dietitian for more options. What foods are not recommended? Palm oil and coconut oil. Processed foods. Fried foods. Sweetened drinks, such as sweet tea, milkshakes, snow cones, iced sweet drinks, and sodas. Alcohol. Sweets. Foods that contain a lot of salt or sodium. The items listed above may not be a complete list of foods and beverages to avoid. Contact your dietitian for more information. This information is not intended to replace advice given to you by your health  care provider. Make sure you discuss any questions you have with your health care provider. Document Released: 08/24/2014 Document Revised: 09/15/2015 Document Reviewed: 05/04/2014 Elsevier Interactive Patient Education  [employer].

## 2017-05-23 NOTE — Assessment & Plan Note (Signed)
History of multiple adenomas. Surveillance due in Dec 2021.

## 2017-09-11 ENCOUNTER — Other Ambulatory Visit (HOSPITAL_COMMUNITY): Payer: Self-pay | Admitting: Interventional Radiology

## 2017-10-02 ENCOUNTER — Telehealth (HOSPITAL_COMMUNITY): Payer: Self-pay

## 2017-10-02 NOTE — Telephone Encounter (Signed)
Called to schedule f/u cta head/neck, no answer, left vm. AW 

## 2017-11-14 ENCOUNTER — Other Ambulatory Visit (HOSPITAL_COMMUNITY): Payer: Self-pay | Admitting: Internal Medicine

## 2017-11-14 DIAGNOSIS — Z1231 Encounter for screening mammogram for malignant neoplasm of breast: Secondary | ICD-10-CM

## 2017-11-20 ENCOUNTER — Ambulatory Visit (HOSPITAL_COMMUNITY)
Admission: RE | Admit: 2017-11-20 | Discharge: 2017-11-20 | Disposition: A | Discharge status: Home / Self Care | Payer: Medicare Other | Source: Ambulatory Visit | Attending: Internal Medicine | Admitting: Internal Medicine

## 2017-11-20 ENCOUNTER — Encounter (HOSPITAL_COMMUNITY): Payer: Self-pay

## 2017-11-20 DIAGNOSIS — Z1231 Encounter for screening mammogram for malignant neoplasm of breast: Secondary | ICD-10-CM | POA: Insufficient documentation

## 2017-11-28 ENCOUNTER — Telehealth: Payer: Self-pay | Admitting: Gastroenterology

## 2017-11-28 NOTE — Telephone Encounter (Signed)
RECALL FOR REPEAT LIVER PANEL

## 2017-11-29 ENCOUNTER — Other Ambulatory Visit: Payer: Self-pay

## 2017-11-29 DIAGNOSIS — R945 Abnormal results of liver function studies: Principal | ICD-10-CM

## 2017-11-29 DIAGNOSIS — R7989 Other specified abnormal findings of blood chemistry: Secondary | ICD-10-CM

## 2017-11-29 NOTE — Telephone Encounter (Signed)
Lab order mailed to pt to do around 01/06/2018.

## 2018-01-06 DIAGNOSIS — R945 Abnormal results of liver function studies: Secondary | ICD-10-CM | POA: Diagnosis not present

## 2018-01-07 LAB — HEPATIC FUNCTION PANEL
AG RATIO: 1.4 (calc) (ref 1.0–2.5)
ALKALINE PHOSPHATASE (APISO): 56 U/L (ref 33–130)
ALT: 64 U/L — AB (ref 6–29)
AST: 34 U/L (ref 10–35)
Albumin: 4.1 g/dL (ref 3.6–5.1)
BILIRUBIN TOTAL: 0.3 mg/dL (ref 0.2–1.2)
Bilirubin, Direct: 0.1 mg/dL (ref 0.0–0.2)
GLOBULIN: 2.9 g/dL (ref 1.9–3.7)
Indirect Bilirubin: 0.2 mg/dL (calc) (ref 0.2–1.2)
Total Protein: 7 g/dL (ref 6.1–8.1)

## 2018-01-15 ENCOUNTER — Encounter: Payer: Self-pay | Admitting: Gastroenterology

## 2018-01-15 ENCOUNTER — Telehealth: Payer: Self-pay | Admitting: Gastroenterology

## 2018-01-15 ENCOUNTER — Other Ambulatory Visit: Payer: Self-pay

## 2018-01-15 ENCOUNTER — Ambulatory Visit (INDEPENDENT_AMBULATORY_CARE_PROVIDER_SITE_OTHER): Payer: Medicare Other | Admitting: Gastroenterology

## 2018-01-15 VITALS — BP 142/92 | HR 113 | Temp 97.7°F | Ht 65.0 in | Wt 179.4 lb

## 2018-01-15 DIAGNOSIS — K7581 Nonalcoholic steatohepatitis (NASH): Secondary | ICD-10-CM

## 2018-01-15 NOTE — Progress Notes (Signed)
CC'D TO PCP °

## 2018-01-15 NOTE — Telephone Encounter (Signed)
PATIENT ON RECALL

## 2018-01-15 NOTE — Progress Notes (Signed)
Referring Provider: Redmond School, MD Primary Care Physician:  Redmond School, MD  Chief Complaint  Patient presents with  . steatohepatitis    doing ok    HPI:   Tamara Thompson is a 67 y.o. female presenting today with a history of colon polyps, with recent colonoscopy completed and found seven serrated adenomas. Due for surveillance in 2021. Due to elevated transaminases, thorough evaluation to include serologies was undertaken. Had positive ASMA and ANA. Liver biopsy with mild steatohepatitis on liver biopsy. Repeat LFTs Sept 2019 with improvement in AST to normal. ALT improved and now 64 (previously 92).   No abdominal pain. Not exercising. Making healthier food choices. No weight loss. No lower GI concerns.   Past Medical History:  Diagnosis Date  . Anemia   . Bleeding from the nose   . Deviated septum   . Diabetes mellitus without complication ([place])   . Kidney stones   . Medical history non-contributory   . Stroke [facility])     Past Surgical History:  Procedure Laterality Date  . ANKLE SURGERY  2010   left  . CESAREAN SECTION    . COLONOSCOPY N/A 12/14/2013   Dr. Oneida Alar: 10 colon polyps removed, ascending colon sessile serrated adenoma, hyperplastic polyps. Moderate diverticulosis throughout colon. Left colon redundant  . COLONOSCOPY N/A 03/29/2017   Dr. Oneida Alar: multiple serrated adenomas. Surveillance in 3 years   . IR ANGIO INTRA EXTRACRAN SEL COM CAROTID INNOMINATE BILAT MOD SED  01/29/2017  . IR ANGIO VERTEBRAL SEL VERTEBRAL BILAT MOD SED  01/29/2017  . IR RADIOLOGIST EVAL & MGMT  01/17/2017  . IR RADIOLOGIST EVAL & MGMT  04/12/2017  . NASAL ENDOSCOPY WITH EPISTAXIS CONTROL Bilateral 02/07/2015   Procedure: BILATERAL NASAL ENDOSCOPIC CAUTERIZATION;  Surgeon: Leta Baptist, MD;  Location: [place];  Service: ENT;  Laterality: Bilateral;  . NASAL SEPTOPLASTY W/ TURBINOPLASTY Bilateral 02/02/2014   Procedure: RIGHT NASAL SEPTOPLASTY WITH TURBINATE  REDUCTION;  Surgeon: Ascencion Dike, MD;  Location: [place];  Service: ENT;  Laterality: Bilateral;  . OVARIAN CYST SURGERY    . POLYPECTOMY Right 02/02/2014   Procedure: RIGHT POLYPECTOMY NASAL;  Surgeon: Ascencion Dike, MD;  Location: [place];  Service: ENT;  Laterality: Right;  . POLYPECTOMY  03/29/2017   Procedure: POLYPECTOMY;  Surgeon: Danie Binder, MD;  [facility];  Service: Endoscopy;;  . Surgery of jaw and cheek fracture      Current Outpatient Medications  Medication Sig Dispense Refill  . acetaminophen (TYLENOL) 500 MG tablet Take 1,000 mg by mouth daily as needed for moderate pain or headache.    . Cholecalciferol 2000 units TABS Take 2,000 Units by mouth daily.     . Coenzyme Q10 (CO Q 10) 100 MG CAPS Take 100 mg by mouth daily.     Marland Kitchen ezetimibe (ZETIA) 10 MG tablet Take 10 mg by mouth daily.    . ferrous sulfate 325 (65 FE) MG tablet Take 325 mg by mouth 3 (three) times daily.     Javier Docker Oil 300 MG CAPS Take 300 mg by mouth daily.     . metFORMIN (GLUCOPHAGE) 500 MG tablet Take 500 mg by mouth 2 (two) times daily.     No current facility-administered medications for this visit.     Allergies as of 01/15/2018 - Review Complete 01/15/2018  Allergen Reaction Noted  . Penicillins  02/28/2012  . Statins Other (See Comments) 01/22/2017  .  Percocet [oxycodone-acetaminophen] Rash 01/28/2015    Family History  Problem Relation Age of Onset  . Heart disease Mother   . Cancer Father        lung  . Stroke Father   . Colon cancer Neg Hx     Social History   Socioeconomic History  . Marital status: Married    Spouse name: Not on file  . Number of children: Not on file  . Years of education: Not on file  . Highest education level: Not on file  Occupational History  . Occupation: housewife  Social Needs  . Financial resource strain: Not on file  . Food insecurity:    Worry: Not on file    Inability: Not on file  .  Transportation needs:    Medical: Not on file    Non-medical: Not on file  Tobacco Use  . Smoking status: Former Smoker    Packs/day: 1.25    Years: 50.00    Pack years: 62.50    Types: Cigarettes    Last attempt to quit: 01/27/2010    Years since quitting: 7.9  . Smokeless tobacco: Never Used  Substance and Sexual Activity  . Alcohol use: No    Frequency: Never  . Drug use: No  . Sexual activity: Not Currently    Birth control/protection: Post-menopausal  Lifestyle  . Physical activity:    Days per week: Not on file    Minutes per session: Not on file  . Stress: Not on file  Relationships  . Social connections:    Talks on phone: Not on file    Gets together: Not on file    Attends religious service: Not on file    Active member of club or organization: Not on file    Attends meetings of clubs or organizations: Not on file    Relationship status: Not on file  Other Topics Concern  . Not on file  Social History Narrative  . Not on file    Review of Systems: Gen: Denies fever, chills, anorexia. Denies fatigue, weakness, weight loss.  CV: Denies chest pain, palpitations, syncope, peripheral edema, and claudication. Resp: Denies dyspnea at rest, cough, wheezing, coughing up blood, and pleurisy. GI: see HPI  Derm: Denies rash, itching, dry skin Psych: Denies depression, anxiety, memory loss, confusion. No homicidal or suicidal ideation.  Heme: Denies bruising, bleeding, and enlarged lymph nodes.  Physical Exam: BP (!) 142/92   Pulse (!) 113   Temp 97.7 F (36.5 C) (Oral)   Ht 5' 5"  (1.651 m)   Wt 179 lb 6.4 oz (81.4 kg)   BMI 29.85 kg/m  General:   Alert and oriented. No distress noted. Pleasant and cooperative.  Head:  Normocephalic and atraumatic. Eyes:  Conjuctiva clear without scleral icterus. Mouth:  Oral mucosa pink and moist. G Abdomen:  +BS, soft, non-tender and non-distended. No rebound or guarding. No HSM or masses noted. Msk:  Symmetrical without  gross deformities. Normal posture. Extremities:  Without edema. Neurologic:  Alert and  oriented x4 Psych:  Alert and cooperative. Normal mood and affect.   Lab Results  Component Value Date   ALT 64 (H) 01/06/2018   AST 34 01/06/2018   ALKPHOS 56 10/26/2016   BILITOT 0.3 01/06/2018

## 2018-01-15 NOTE — Telephone Encounter (Signed)
fowarding to stacey/susan

## 2018-01-15 NOTE — Assessment & Plan Note (Signed)
67 year old female with biopsy-proven steatohepatitis. LFTs are improving with dietary changes but weight remains same. Will schedule elastography in 6 months along with updated LFTs, as last imaging was in 2017. Continue dietary/weight loss efforts. Return in 1 year or sooner if needed. Next colonoscopy in 2021.

## 2018-01-15 NOTE — Patient Instructions (Signed)
Keep up the good work!  We will repeat blood work in 6 months and see you back in 1 year.  Your next colonoscopy is due in 2021.  Please call with any concerns!  It was a pleasure to see you today. I strive to create trusting relationships with patients to provide genuine, compassionate, and quality care. I value your feedback. If you receive a survey regarding your visit,  I greatly appreciate you taking time to fill this out.   Annitta Needs, PhD, ANP-BC [facility]

## 2018-01-15 NOTE — Telephone Encounter (Signed)
Can we nic for recall for ultrasound with elastography in 6 months when she has repeat HFP? History of NASH.

## 2018-02-20 ENCOUNTER — Other Ambulatory Visit: Payer: Self-pay | Admitting: Urology

## 2018-02-20 DIAGNOSIS — N281 Cyst of kidney, acquired: Secondary | ICD-10-CM

## 2018-02-26 ENCOUNTER — Ambulatory Visit (HOSPITAL_COMMUNITY)
Admission: RE | Admit: 2018-02-26 | Discharge: 2018-02-26 | Disposition: A | Discharge status: Home / Self Care | Payer: Medicare Other | Source: Ambulatory Visit | Attending: Urology | Admitting: Urology

## 2018-02-26 DIAGNOSIS — N281 Cyst of kidney, acquired: Secondary | ICD-10-CM | POA: Diagnosis not present

## 2018-02-28 DIAGNOSIS — Z0001 Encounter for general adult medical examination with abnormal findings: Secondary | ICD-10-CM | POA: Diagnosis not present

## 2018-02-28 DIAGNOSIS — E6609 Other obesity due to excess calories: Secondary | ICD-10-CM | POA: Diagnosis not present

## 2018-02-28 DIAGNOSIS — Z1389 Encounter for screening for other disorder: Secondary | ICD-10-CM | POA: Diagnosis not present

## 2018-02-28 DIAGNOSIS — Z23 Encounter for immunization: Secondary | ICD-10-CM | POA: Diagnosis not present

## 2018-02-28 DIAGNOSIS — Z6830 Body mass index (BMI) 30.0-30.9, adult: Secondary | ICD-10-CM | POA: Diagnosis not present

## 2018-03-07 ENCOUNTER — Ambulatory Visit (INDEPENDENT_AMBULATORY_CARE_PROVIDER_SITE_OTHER): Payer: Medicare Other | Admitting: Urology

## 2018-03-07 DIAGNOSIS — N200 Calculus of kidney: Secondary | ICD-10-CM | POA: Diagnosis not present

## 2018-03-07 DIAGNOSIS — N281 Cyst of kidney, acquired: Secondary | ICD-10-CM

## 2018-05-13 ENCOUNTER — Telehealth: Payer: Self-pay | Admitting: Gastroenterology

## 2018-05-13 NOTE — Telephone Encounter (Signed)
Letter mailed

## 2018-05-13 NOTE — Telephone Encounter (Signed)
Recall for ultrasound 

## 2018-05-16 ENCOUNTER — Telehealth: Payer: Self-pay | Admitting: Gastroenterology

## 2018-05-16 DIAGNOSIS — K7581 Nonalcoholic steatohepatitis (NASH): Secondary | ICD-10-CM

## 2018-05-16 DIAGNOSIS — R945 Abnormal results of liver function studies: Secondary | ICD-10-CM

## 2018-05-16 DIAGNOSIS — R7989 Other specified abnormal findings of blood chemistry: Secondary | ICD-10-CM

## 2018-05-16 NOTE — Telephone Encounter (Signed)
LMTCB

## 2018-05-16 NOTE — Telephone Encounter (Signed)
PATIENT DOES NOT KNOW WHY SHE IS TO HAVE AN ULTRASOUND  PLEASE ADVISE

## 2018-05-19 NOTE — Telephone Encounter (Signed)
Per 01/15/2018 phone note, AB stated to NIC ultrasound with elastography in 6 months. AB do you want RUQ or complete with elastography? Thanks

## 2018-05-19 NOTE — Addendum Note (Signed)
Addended by: Inge Rise on: 05/19/2018 01:35 PM   Modules accepted: Orders

## 2018-05-19 NOTE — Telephone Encounter (Signed)
U/s scheduled for 07/11/2018 at 9:30am, arrival time 9:15am, npo after midnight. Patient aware. Letter mailed

## 2018-05-19 NOTE — Telephone Encounter (Signed)
Yes, needs [place] complete with elastography.

## 2018-05-29 ENCOUNTER — Encounter: Payer: Self-pay | Admitting: *Deleted

## 2018-05-29 ENCOUNTER — Other Ambulatory Visit: Payer: Self-pay | Admitting: *Deleted

## 2018-05-29 DIAGNOSIS — K7581 Nonalcoholic steatohepatitis (NASH): Secondary | ICD-10-CM

## 2018-07-11 ENCOUNTER — Ambulatory Visit (HOSPITAL_COMMUNITY)
Admission: RE | Admit: 2018-07-11 | Discharge: 2018-07-11 | Disposition: A | Discharge status: Home / Self Care | Payer: Medicare Other | Source: Ambulatory Visit | Attending: Gastroenterology | Admitting: Gastroenterology

## 2018-07-11 ENCOUNTER — Other Ambulatory Visit: Payer: Self-pay

## 2018-07-11 DIAGNOSIS — K7581 Nonalcoholic steatohepatitis (NASH): Secondary | ICD-10-CM | POA: Insufficient documentation

## 2018-07-11 DIAGNOSIS — R945 Abnormal results of liver function studies: Secondary | ICD-10-CM | POA: Insufficient documentation

## 2018-07-11 DIAGNOSIS — R7989 Other specified abnormal findings of blood chemistry: Secondary | ICD-10-CM

## 2018-07-11 DIAGNOSIS — K760 Fatty (change of) liver, not elsewhere classified: Secondary | ICD-10-CM | POA: Diagnosis not present

## 2018-07-11 DIAGNOSIS — N281 Cyst of kidney, acquired: Secondary | ICD-10-CM | POA: Diagnosis not present

## 2018-07-14 NOTE — Progress Notes (Signed)
PT is aware. Forwarding to [place] to nic.

## 2018-07-14 NOTE — Progress Notes (Signed)
[place] without obvious cirrhosis. Metavir F3/F4. Spleen size normal. Would favor ultrasound every 6 months to 1 year if patient is willing.

## 2018-07-16 DIAGNOSIS — K7581 Nonalcoholic steatohepatitis (NASH): Secondary | ICD-10-CM | POA: Diagnosis not present

## 2018-07-16 LAB — HEPATIC FUNCTION PANEL
AG RATIO: 1.4 (calc) (ref 1.0–2.5)
ALKALINE PHOSPHATASE (APISO): 64 U/L (ref 37–153)
ALT: 150 U/L — AB (ref 6–29)
AST: 76 U/L — ABNORMAL HIGH (ref 10–35)
Albumin: 4.2 g/dL (ref 3.6–5.1)
BILIRUBIN TOTAL: 0.3 mg/dL (ref 0.2–1.2)
Bilirubin, Direct: 0.1 mg/dL (ref 0.0–0.2)
GLOBULIN: 3.1 g/dL (ref 1.9–3.7)
Indirect Bilirubin: 0.2 mg/dL (calc) (ref 0.2–1.2)
TOTAL PROTEIN: 7.3 g/dL (ref 6.1–8.1)

## 2018-07-18 DIAGNOSIS — E1165 Type 2 diabetes mellitus with hyperglycemia: Secondary | ICD-10-CM | POA: Diagnosis not present

## 2018-07-18 DIAGNOSIS — Z1389 Encounter for screening for other disorder: Secondary | ICD-10-CM | POA: Diagnosis not present

## 2018-07-18 DIAGNOSIS — E6609 Other obesity due to excess calories: Secondary | ICD-10-CM | POA: Diagnosis not present

## 2018-07-18 DIAGNOSIS — E7849 Other hyperlipidemia: Secondary | ICD-10-CM | POA: Diagnosis not present

## 2018-07-18 DIAGNOSIS — I610 Nontraumatic intracerebral hemorrhage in hemisphere, subcortical: Secondary | ICD-10-CM | POA: Diagnosis not present

## 2018-07-18 DIAGNOSIS — Z6830 Body mass index (BMI) 30.0-30.9, adult: Secondary | ICD-10-CM | POA: Diagnosis not present

## 2018-07-18 DIAGNOSIS — E559 Vitamin D deficiency, unspecified: Secondary | ICD-10-CM | POA: Diagnosis not present

## 2018-07-18 DIAGNOSIS — K7689 Other specified diseases of liver: Secondary | ICD-10-CM | POA: Diagnosis not present

## 2018-07-19 DIAGNOSIS — E119 Type 2 diabetes mellitus without complications: Secondary | ICD-10-CM | POA: Diagnosis not present

## 2018-07-19 DIAGNOSIS — E7849 Other hyperlipidemia: Secondary | ICD-10-CM | POA: Diagnosis not present

## 2018-07-21 ENCOUNTER — Other Ambulatory Visit: Payer: Self-pay

## 2018-07-21 DIAGNOSIS — R740 Nonspecific elevation of levels of transaminase and lactic acid dehydrogenase [LDH]: Principal | ICD-10-CM

## 2018-07-21 DIAGNOSIS — R7401 Elevation of levels of liver transaminase levels: Secondary | ICD-10-CM

## 2018-07-21 NOTE — Progress Notes (Signed)
Pt is aware of results. She has not had any abdominal pain. She has been trying to exercise and eat more health. Will put lab orders in to repeat in 3 months.

## 2018-07-21 NOTE — Progress Notes (Signed)
Jump in transaminases noted. Any abdominal pain? Has she been following a healthy diet and exercising? We need to repeat these in 3 months and pursue aggressive lifestyle changes. Known NASH. Liver biopsy on file.

## 2018-09-17 ENCOUNTER — Other Ambulatory Visit: Payer: Self-pay

## 2018-09-17 DIAGNOSIS — R7401 Elevation of levels of liver transaminase levels: Secondary | ICD-10-CM

## 2018-10-21 DIAGNOSIS — R740 Nonspecific elevation of levels of transaminase and lactic acid dehydrogenase [LDH]: Secondary | ICD-10-CM | POA: Diagnosis not present

## 2018-10-22 LAB — HEPATIC FUNCTION PANEL
AG Ratio: 1.4 (calc) (ref 1.0–2.5)
ALT: 145 U/L — ABNORMAL HIGH (ref 6–29)
AST: 86 U/L — ABNORMAL HIGH (ref 10–35)
Albumin: 4.1 g/dL (ref 3.6–5.1)
Alkaline phosphatase (APISO): 61 U/L (ref 37–153)
Bilirubin, Direct: 0.1 mg/dL (ref 0.0–0.2)
Globulin: 3 g/dL (calc) (ref 1.9–3.7)
Indirect Bilirubin: 0.3 mg/dL (calc) (ref 0.2–1.2)
Total Bilirubin: 0.4 mg/dL (ref 0.2–1.2)
Total Protein: 7.1 g/dL (ref 6.1–8.1)

## 2018-10-27 NOTE — Progress Notes (Signed)
Transaminases similar to 3 months ago, fluctuating over past year. Is she exercising, eating healthy, etc? We need to repeat in 6 months.

## 2018-10-28 ENCOUNTER — Other Ambulatory Visit: Payer: Self-pay

## 2018-10-28 DIAGNOSIS — R7989 Other specified abnormal findings of blood chemistry: Secondary | ICD-10-CM

## 2018-10-28 NOTE — Progress Notes (Signed)
hep

## 2018-10-28 NOTE — Progress Notes (Signed)
LMOM to call.

## 2018-12-22 ENCOUNTER — Other Ambulatory Visit (HOSPITAL_COMMUNITY): Payer: Self-pay | Admitting: Internal Medicine

## 2018-12-22 DIAGNOSIS — Z1231 Encounter for screening mammogram for malignant neoplasm of breast: Secondary | ICD-10-CM

## 2018-12-23 ENCOUNTER — Encounter: Payer: Self-pay | Admitting: Gastroenterology

## 2018-12-23 ENCOUNTER — Telehealth: Payer: Self-pay | Admitting: Gastroenterology

## 2018-12-23 NOTE — Telephone Encounter (Signed)
RECALL FOR ULTRASOUND 

## 2018-12-23 NOTE — Telephone Encounter (Signed)
Recall sent 

## 2018-12-26 ENCOUNTER — Encounter (HOSPITAL_COMMUNITY): Payer: Self-pay

## 2018-12-26 ENCOUNTER — Ambulatory Visit (HOSPITAL_COMMUNITY)
Admission: RE | Admit: 2018-12-26 | Discharge: 2018-12-26 | Disposition: A | Discharge status: Home / Self Care | Payer: Medicare Other | Source: Ambulatory Visit | Attending: Internal Medicine | Admitting: Internal Medicine

## 2018-12-26 ENCOUNTER — Other Ambulatory Visit: Payer: Self-pay

## 2018-12-26 DIAGNOSIS — Z1231 Encounter for screening mammogram for malignant neoplasm of breast: Secondary | ICD-10-CM

## 2018-12-31 ENCOUNTER — Telehealth: Payer: Self-pay | Admitting: Gastroenterology

## 2018-12-31 DIAGNOSIS — R7401 Elevation of levels of liver transaminase levels: Secondary | ICD-10-CM

## 2018-12-31 DIAGNOSIS — K7581 Nonalcoholic steatohepatitis (NASH): Secondary | ICD-10-CM

## 2018-12-31 NOTE — Telephone Encounter (Signed)
U/s scheduled for 9/15 at 10:30am, arrival time 10:15am, npo after midnight.  Patient aware of appt details

## 2018-12-31 NOTE — Telephone Encounter (Signed)
RUQ ultrasound will be fine.

## 2018-12-31 NOTE — Telephone Encounter (Signed)
Per last ultrasound complete with elastography: Notes recorded by Annitta Needs, NP on 07/14/2018 at 11:05 AM EDT  [place] without obvious cirrhosis. Metavir F3/F4. Spleen size normal. Would favor ultrasound every 6 months to 1 year if patient is willing.   Please advise AB which ultrasound patient will need

## 2018-12-31 NOTE — Telephone Encounter (Signed)
Pt received a letter that it was time to schedule her U/S. [phone]

## 2019-01-01 ENCOUNTER — Other Ambulatory Visit (HOSPITAL_COMMUNITY): Payer: Self-pay | Admitting: Internal Medicine

## 2019-01-01 DIAGNOSIS — R928 Other abnormal and inconclusive findings on diagnostic imaging of breast: Secondary | ICD-10-CM

## 2019-01-06 ENCOUNTER — Ambulatory Visit (HOSPITAL_COMMUNITY)
Admission: RE | Admit: 2019-01-06 | Discharge: 2019-01-06 | Disposition: A | Discharge status: Home / Self Care | Payer: Medicare Other | Source: Ambulatory Visit | Attending: Internal Medicine | Admitting: Internal Medicine

## 2019-01-06 ENCOUNTER — Other Ambulatory Visit: Payer: Self-pay

## 2019-01-06 ENCOUNTER — Ambulatory Visit (HOSPITAL_COMMUNITY)
Admission: RE | Admit: 2019-01-06 | Discharge: 2019-01-06 | Disposition: A | Discharge status: Home / Self Care | Payer: Medicare Other | Source: Ambulatory Visit | Attending: Gastroenterology | Admitting: Gastroenterology

## 2019-01-06 DIAGNOSIS — R928 Other abnormal and inconclusive findings on diagnostic imaging of breast: Secondary | ICD-10-CM

## 2019-01-06 DIAGNOSIS — R7401 Elevation of levels of liver transaminase levels: Secondary | ICD-10-CM

## 2019-01-06 DIAGNOSIS — R740 Nonspecific elevation of levels of transaminase and lactic acid dehydrogenase [LDH]: Secondary | ICD-10-CM | POA: Diagnosis not present

## 2019-01-06 DIAGNOSIS — K7581 Nonalcoholic steatohepatitis (NASH): Secondary | ICD-10-CM | POA: Diagnosis not present

## 2019-01-06 DIAGNOSIS — K760 Fatty (change of) liver, not elsewhere classified: Secondary | ICD-10-CM | POA: Diagnosis not present

## 2019-01-06 DIAGNOSIS — N6002 Solitary cyst of left breast: Secondary | ICD-10-CM | POA: Diagnosis not present

## 2019-01-15 NOTE — Progress Notes (Signed)
Fatty liver. Elevated fibrosis score historically. We can move to yearly ultrasounds if patient is agreeable.

## 2019-01-30 ENCOUNTER — Other Ambulatory Visit: Payer: Self-pay

## 2019-01-30 ENCOUNTER — Encounter: Payer: Self-pay | Admitting: Gastroenterology

## 2019-01-30 ENCOUNTER — Telehealth: Payer: Self-pay | Admitting: Gastroenterology

## 2019-01-30 ENCOUNTER — Ambulatory Visit (INDEPENDENT_AMBULATORY_CARE_PROVIDER_SITE_OTHER): Payer: Medicare Other | Admitting: Gastroenterology

## 2019-01-30 VITALS — BP 131/87 | HR 90 | Temp 97.0°F | Ht 65.0 in | Wt 178.6 lb

## 2019-01-30 DIAGNOSIS — K7581 Nonalcoholic steatohepatitis (NASH): Secondary | ICD-10-CM | POA: Diagnosis not present

## 2019-01-30 NOTE — Patient Instructions (Addendum)
Labs will be due again in Dec 2020.   We will order an ultrasound for March 2021.  We will see you again in Oct/Nov 2021.  I will be getting blood work from Dr. Gerarda Fraction. It is important to have good control of diabetes, cholesterol, eat a low fat diet, exercise.   Further recommendations to follow after review of labs!  I enjoyed seeing you again today! As you know, I value our relationship and want to provide genuine, compassionate, and quality care. I welcome your feedback. If you receive a survey regarding your visit,  I greatly appreciate you taking time to fill this out. See you next time!  Annitta Needs, PhD, ANP-BC [facility]

## 2019-01-30 NOTE — Progress Notes (Signed)
Referring Provider: Redmond School, MD Primary Care Physician:  Redmond School, MD Primary GI: Dr. Oneida Alar   Chief Complaint  Patient presents with  . steatohepatitis    f/u. no complaints    HPI:   Tamara Thompson is a 68 y.o. female presenting today with a history of  colon polyps, with recent colonoscopy completed and found seven serrated adenomas. Due for surveillance in 2021. Due to elevated transaminases, thorough evaluation to include serologies was undertaken. Had positive ASMA and ANA. Liver biopsy with mild steatohepatitis on liver biopsy. Repeat LFTs Sept 2019 with improvement in AST to normal.   Recent bump in transaminases to AST 86 and ALT 145 (June 2020). Repeat in Dec 2020. [place] due in March 2020. Colonoscopy Dec 2021. No overt GI bleeding. No abdominal pain. Weight is stable. No fatty/fried foods. Does not exercise much. She is unsure when last labs were done by PCP. History of diabetes. Unsure last A1c. No mental status changes or confusion.   Past Medical History:  Diagnosis Date  . Anemia   . Bleeding from the nose   . Deviated septum   . Diabetes mellitus without complication ([place])   . Kidney stones   . Medical history non-contributory   . Stroke [facility])     Past Surgical History:  Procedure Laterality Date  . ANKLE SURGERY  2010   left  . CESAREAN SECTION    . COLONOSCOPY N/A 12/14/2013   Dr. Oneida Alar: 10 colon polyps removed, ascending colon sessile serrated adenoma, hyperplastic polyps. Moderate diverticulosis throughout colon. Left colon redundant  . COLONOSCOPY N/A 03/29/2017   Dr. Oneida Alar: multiple serrated adenomas. Surveillance in 3 years   . IR ANGIO INTRA EXTRACRAN SEL COM CAROTID INNOMINATE BILAT MOD SED  01/29/2017  . IR ANGIO VERTEBRAL SEL VERTEBRAL BILAT MOD SED  01/29/2017  . IR RADIOLOGIST EVAL & MGMT  01/17/2017  . IR RADIOLOGIST EVAL & MGMT  04/12/2017  . NASAL ENDOSCOPY WITH EPISTAXIS CONTROL Bilateral 02/07/2015   Procedure: BILATERAL  NASAL ENDOSCOPIC CAUTERIZATION;  Surgeon: Leta Baptist, MD;  Location: [place];  Service: ENT;  Laterality: Bilateral;  . NASAL SEPTOPLASTY W/ TURBINOPLASTY Bilateral 02/02/2014   Procedure: RIGHT NASAL SEPTOPLASTY WITH TURBINATE REDUCTION;  Surgeon: Ascencion Dike, MD;  Location: [place];  Service: ENT;  Laterality: Bilateral;  . OVARIAN CYST SURGERY    . POLYPECTOMY Right 02/02/2014   Procedure: RIGHT POLYPECTOMY NASAL;  Surgeon: Ascencion Dike, MD;  Location: [place];  Service: ENT;  Laterality: Right;  . POLYPECTOMY  03/29/2017   Procedure: POLYPECTOMY;  Surgeon: Danie Binder, MD;  [facility];  Service: Endoscopy;;  . Surgery of jaw and cheek fracture      Current Outpatient Medications  Medication Sig Dispense Refill  . acetaminophen (TYLENOL) 500 MG tablet Take 1,000 mg by mouth daily as needed for moderate pain or headache.    . Cholecalciferol 2000 units TABS Take 2,000 Units by mouth daily.     . Coenzyme Q10 (CO Q 10) 100 MG CAPS Take 100 mg by mouth daily.     Marland Kitchen ezetimibe (ZETIA) 10 MG tablet Take 10 mg by mouth daily.    . ferrous sulfate 325 (65 FE) MG tablet Take 325 mg by mouth 3 (three) times daily.     . metFORMIN (GLUCOPHAGE) 500 MG tablet Take 500 mg by mouth 2 (two) times daily.    Javier Docker Oil 300 MG CAPS Take  300 mg by mouth daily.      No current facility-administered medications for this visit.     Allergies as of 01/30/2019 - Review Complete 01/30/2019  Allergen Reaction Noted  . Penicillins  02/28/2012  . Statins Other (See Comments) 01/22/2017  . Percocet [oxycodone-acetaminophen] Rash 01/28/2015    Family History  Problem Relation Age of Onset  . Heart disease Mother   . Cancer Father        lung  . Stroke Father   . Colon cancer Neg Hx     Social History   Socioeconomic History  . Marital status: Married    Spouse name: Not on file  . Number of children: Not on file  . Years of education:  Not on file  . Highest education level: Not on file  Occupational History  . Occupation: housewife  Social Needs  . Financial resource strain: Not on file  . Food insecurity    Worry: Not on file    Inability: Not on file  . Transportation needs    Medical: Not on file    Non-medical: Not on file  Tobacco Use  . Smoking status: Former Smoker    Packs/day: 1.25    Years: 50.00    Pack years: 62.50    Types: Cigarettes    Quit date: 01/27/2010    Years since quitting: 9.0  . Smokeless tobacco: Never Used  Substance and Sexual Activity  . Alcohol use: No    Frequency: Never  . Drug use: No  . Sexual activity: Not Currently    Birth control/protection: Post-menopausal  Lifestyle  . Physical activity    Days per week: Not on file    Minutes per session: Not on file  . Stress: Not on file  Relationships  . Social Herbalist on phone: Not on file    Gets together: Not on file    Attends religious service: Not on file    Active member of club or organization: Not on file    Attends meetings of clubs or organizations: Not on file    Relationship status: Not on file  Other Topics Concern  . Not on file  Social History Narrative  . Not on file    Review of Systems: Gen: Denies fever, chills, anorexia. Denies fatigue, weakness, weight loss.  CV: Denies chest pain, palpitations, syncope, peripheral edema, and claudication. Resp: Denies dyspnea at rest, cough, wheezing, coughing up blood, and pleurisy. GI: see HPI Derm: Denies rash, itching, dry skin Psych: Denies depression, anxiety, memory loss, confusion. No homicidal or suicidal ideation.  Heme: Denies bruising, bleeding, and enlarged lymph nodes.  Physical Exam: BP 131/87   Pulse 90   Temp (!) 97 F (36.1 C) (Oral)   Ht 5' 5"  (1.651 m)   Wt 178 lb 9.6 oz (81 kg)   BMI 29.72 kg/m  General:   Alert and oriented. No distress noted. Pleasant and cooperative.  Head:  Normocephalic and atraumatic. Eyes:   Conjuctiva clear without scleral icterus. Abdomen:  +BS, soft, non-tender and non-distended. No rebound or guarding. No HSM or masses noted. Msk:  Symmetrical without gross deformities. Normal posture. Extremities:  Without edema. Neurologic:  Alert and  oriented x4 Psych:  Alert and cooperative. Normal mood and affect.

## 2019-01-30 NOTE — Assessment & Plan Note (Signed)
68 year old female with biopsy-proven mild steatohepatitis, transaminases fluctuating. Elastography with F3/F4 recently. Possibility of overcall. However, with fluctuating transaminases and known steatohepatitis, will pursue repeat US in 6 months. Will also need to retreive Hep B and C labs from PCP, as I am unable to find these scanned into our system. Obtain most recent labs from PCP as well. Return in 1 year, with [place] in March 2021. Colonoscopy in Dec 2021.

## 2019-01-30 NOTE — Telephone Encounter (Signed)
Tamara Thompson, can we get Hep B and C labs from PCP? For some reason, we don't have these scanned into the system. Also need most recent routine labs from PCP to have on file.

## 2019-02-02 NOTE — Telephone Encounter (Signed)
requested

## 2019-02-13 DIAGNOSIS — Z23 Encounter for immunization: Secondary | ICD-10-CM | POA: Diagnosis not present

## 2019-03-23 NOTE — Telephone Encounter (Signed)
Labs reviewed. March 2020: negative Hep A antibody, negative Hep B surface antigen, Negative Hep C core antibody, negative Hep C antibody. A1c 6.8.   She needs Hep A/B vaccinations. No recent HFP. Please have this completed.

## 2019-03-24 NOTE — Telephone Encounter (Signed)
PT is aware and I am mailing her the order for the Hep A and B vaccinations.

## 2019-03-30 ENCOUNTER — Other Ambulatory Visit: Payer: Self-pay

## 2019-03-30 ENCOUNTER — Ambulatory Visit (HOSPITAL_COMMUNITY)
Admission: RE | Admit: 2019-03-30 | Discharge: 2019-03-30 | Disposition: A | Discharge status: Home / Self Care | Payer: Medicare Other | Source: Ambulatory Visit | Attending: Urology | Admitting: Urology

## 2019-03-30 ENCOUNTER — Other Ambulatory Visit (HOSPITAL_COMMUNITY): Payer: Self-pay | Admitting: Urology

## 2019-03-30 DIAGNOSIS — N200 Calculus of kidney: Secondary | ICD-10-CM | POA: Insufficient documentation

## 2019-04-03 ENCOUNTER — Ambulatory Visit (INDEPENDENT_AMBULATORY_CARE_PROVIDER_SITE_OTHER): Payer: Medicare Other | Admitting: Urology

## 2019-04-03 ENCOUNTER — Other Ambulatory Visit: Payer: Self-pay

## 2019-04-03 DIAGNOSIS — N200 Calculus of kidney: Secondary | ICD-10-CM

## 2019-04-03 DIAGNOSIS — N281 Cyst of kidney, acquired: Secondary | ICD-10-CM

## 2019-04-06 ENCOUNTER — Other Ambulatory Visit: Payer: Self-pay

## 2019-04-06 DIAGNOSIS — R7989 Other specified abnormal findings of blood chemistry: Secondary | ICD-10-CM

## 2019-04-30 DIAGNOSIS — R7989 Other specified abnormal findings of blood chemistry: Secondary | ICD-10-CM | POA: Diagnosis not present

## 2019-05-01 LAB — HEPATIC FUNCTION PANEL
AG Ratio: 1.2 (calc) (ref 1.0–2.5)
ALT: 102 U/L — ABNORMAL HIGH (ref 6–29)
AST: 55 U/L — ABNORMAL HIGH (ref 10–35)
Albumin: 4.3 g/dL (ref 3.6–5.1)
Alkaline phosphatase (APISO): 65 U/L (ref 37–153)
Bilirubin, Direct: 0.1 mg/dL (ref 0.0–0.2)
Globulin: 3.5 g/dL (calc) (ref 1.9–3.7)
Indirect Bilirubin: 0.2 mg/dL (calc) (ref 0.2–1.2)
Total Bilirubin: 0.3 mg/dL (ref 0.2–1.2)
Total Protein: 7.8 g/dL (ref 6.1–8.1)

## 2019-05-04 ENCOUNTER — Other Ambulatory Visit: Payer: Self-pay

## 2019-05-04 DIAGNOSIS — R7989 Other specified abnormal findings of blood chemistry: Secondary | ICD-10-CM

## 2019-05-07 ENCOUNTER — Other Ambulatory Visit: Payer: Self-pay

## 2019-05-07 DIAGNOSIS — N200 Calculus of kidney: Secondary | ICD-10-CM

## 2019-05-07 DIAGNOSIS — N281 Cyst of kidney, acquired: Secondary | ICD-10-CM

## 2019-05-24 DIAGNOSIS — E1165 Type 2 diabetes mellitus with hyperglycemia: Secondary | ICD-10-CM | POA: Diagnosis not present

## 2019-05-24 DIAGNOSIS — I10 Essential (primary) hypertension: Secondary | ICD-10-CM | POA: Diagnosis not present

## 2019-05-24 DIAGNOSIS — E7849 Other hyperlipidemia: Secondary | ICD-10-CM | POA: Diagnosis not present

## 2019-05-28 ENCOUNTER — Telehealth: Payer: Self-pay | Admitting: Gastroenterology

## 2019-05-28 NOTE — Telephone Encounter (Signed)
Letter mailed

## 2019-05-28 NOTE — Telephone Encounter (Signed)
Recall for ultrasound 

## 2019-06-04 ENCOUNTER — Telehealth: Payer: Self-pay | Admitting: Gastroenterology

## 2019-06-04 DIAGNOSIS — K7581 Nonalcoholic steatohepatitis (NASH): Secondary | ICD-10-CM

## 2019-06-04 NOTE — Telephone Encounter (Signed)
Please call patient to schedule ultrasound.

## 2019-06-05 NOTE — Addendum Note (Signed)
Addended by: Zara Council C on: 06/05/2019 11:03 AM   Modules accepted: Orders

## 2019-06-05 NOTE — Telephone Encounter (Signed)
[place] abd ruq scheduled for 07/03/19 at 9:30am, arrive at 9:15am. NPO after midnight prior to test.  Called and informed pt. Letter mailed.

## 2019-07-03 ENCOUNTER — Ambulatory Visit (HOSPITAL_COMMUNITY)
Admission: RE | Admit: 2019-07-03 | Discharge: 2019-07-03 | Disposition: A | Discharge status: Home / Self Care | Payer: Medicare Other | Source: Ambulatory Visit | Attending: Gastroenterology | Admitting: Gastroenterology

## 2019-07-03 ENCOUNTER — Other Ambulatory Visit: Payer: Self-pay

## 2019-07-03 DIAGNOSIS — K760 Fatty (change of) liver, not elsewhere classified: Secondary | ICD-10-CM | POA: Diagnosis not present

## 2019-07-03 DIAGNOSIS — K7581 Nonalcoholic steatohepatitis (NASH): Secondary | ICD-10-CM | POA: Diagnosis not present

## 2019-07-04 DIAGNOSIS — E7849 Other hyperlipidemia: Secondary | ICD-10-CM | POA: Diagnosis not present

## 2019-07-04 DIAGNOSIS — I10 Essential (primary) hypertension: Secondary | ICD-10-CM | POA: Diagnosis not present

## 2019-07-04 DIAGNOSIS — E1165 Type 2 diabetes mellitus with hyperglycemia: Secondary | ICD-10-CM | POA: Diagnosis not present

## 2019-07-10 ENCOUNTER — Ambulatory Visit: Payer: Medicare Other | Attending: Internal Medicine

## 2019-07-10 DIAGNOSIS — Z23 Encounter for immunization: Secondary | ICD-10-CM

## 2019-07-10 NOTE — Progress Notes (Signed)
   Covid-19 Vaccination Clinic  Name:  Tamara Thompson    MRN: [id number] DOB: 1950-07-31  07/10/2019  Tamara Thompson was observed post Covid-19 immunization for 15 minutes without incident. She was provided with Vaccine Information Sheet and instruction to access the V-Safe system.   Tamara Thompson was instructed to call 911 with any severe reactions post vaccine: Marland Kitchen Difficulty breathing  . Swelling of face and throat  . A fast heartbeat  . A bad rash all over body  . Dizziness and weakness   Immunizations Administered    Name Date Dose VIS Date Route   Moderna COVID-19 Vaccine 07/10/2019 10:13 AM 0.5 mL 03/24/2019 Intramuscular   Manufacturer: Moderna   Lot: [id number]   [place]: [id number]

## 2019-07-17 DIAGNOSIS — R739 Hyperglycemia, unspecified: Secondary | ICD-10-CM | POA: Diagnosis not present

## 2019-07-17 DIAGNOSIS — Z6829 Body mass index (BMI) 29.0-29.9, adult: Secondary | ICD-10-CM | POA: Diagnosis not present

## 2019-07-17 DIAGNOSIS — Z0000 Encounter for general adult medical examination without abnormal findings: Secondary | ICD-10-CM | POA: Diagnosis not present

## 2019-07-17 DIAGNOSIS — E7849 Other hyperlipidemia: Secondary | ICD-10-CM | POA: Diagnosis not present

## 2019-07-17 DIAGNOSIS — I358 Other nonrheumatic aortic valve disorders: Secondary | ICD-10-CM | POA: Diagnosis not present

## 2019-07-17 DIAGNOSIS — E039 Hypothyroidism, unspecified: Secondary | ICD-10-CM | POA: Diagnosis not present

## 2019-07-17 DIAGNOSIS — Z1389 Encounter for screening for other disorder: Secondary | ICD-10-CM | POA: Diagnosis not present

## 2019-07-17 DIAGNOSIS — E663 Overweight: Secondary | ICD-10-CM | POA: Diagnosis not present

## 2019-07-17 DIAGNOSIS — I10 Essential (primary) hypertension: Secondary | ICD-10-CM | POA: Diagnosis not present

## 2019-07-17 DIAGNOSIS — E119 Type 2 diabetes mellitus without complications: Secondary | ICD-10-CM | POA: Diagnosis not present

## 2019-07-17 DIAGNOSIS — J302 Other seasonal allergic rhinitis: Secondary | ICD-10-CM | POA: Diagnosis not present

## 2019-07-21 DIAGNOSIS — E119 Type 2 diabetes mellitus without complications: Secondary | ICD-10-CM | POA: Diagnosis not present

## 2019-07-21 DIAGNOSIS — E663 Overweight: Secondary | ICD-10-CM | POA: Diagnosis not present

## 2019-07-21 DIAGNOSIS — Z0000 Encounter for general adult medical examination without abnormal findings: Secondary | ICD-10-CM | POA: Diagnosis not present

## 2019-07-21 DIAGNOSIS — Z6829 Body mass index (BMI) 29.0-29.9, adult: Secondary | ICD-10-CM | POA: Diagnosis not present

## 2019-07-21 DIAGNOSIS — Z1389 Encounter for screening for other disorder: Secondary | ICD-10-CM | POA: Diagnosis not present

## 2019-08-11 ENCOUNTER — Ambulatory Visit: Payer: Medicare Other | Attending: Internal Medicine

## 2019-08-11 DIAGNOSIS — Z23 Encounter for immunization: Secondary | ICD-10-CM

## 2019-08-11 NOTE — Progress Notes (Signed)
   Covid-19 Vaccination Clinic  Name:  Tamara Thompson    MRN: [id number] DOB: Jul 02, 1950  08/11/2019  Tamara Thompson was observed post Covid-19 immunization for 15 minutes without incident. She was provided with Vaccine Information Sheet and instruction to access the V-Safe system.   Tamara Thompson was instructed to call 911 with any severe reactions post vaccine: Marland Kitchen Difficulty breathing  . Swelling of face and throat  . A fast heartbeat  . A bad rash all over body  . Dizziness and weakness   Immunizations Administered    Name Date Dose VIS Date Route   Moderna COVID-19 Vaccine 08/11/2019  9:59 AM 0.5 mL 03/2019 Intramuscular   Manufacturer: Moderna   Lot: [id number]   [place]: [id number]

## 2019-09-21 DIAGNOSIS — E1165 Type 2 diabetes mellitus with hyperglycemia: Secondary | ICD-10-CM | POA: Diagnosis not present

## 2019-09-21 DIAGNOSIS — E7849 Other hyperlipidemia: Secondary | ICD-10-CM | POA: Diagnosis not present

## 2019-09-21 DIAGNOSIS — I10 Essential (primary) hypertension: Secondary | ICD-10-CM | POA: Diagnosis not present

## 2019-09-22 ENCOUNTER — Other Ambulatory Visit: Payer: Self-pay | Admitting: *Deleted

## 2019-09-22 ENCOUNTER — Encounter: Payer: Self-pay | Admitting: *Deleted

## 2019-09-22 DIAGNOSIS — R7989 Other specified abnormal findings of blood chemistry: Secondary | ICD-10-CM

## 2019-10-28 DIAGNOSIS — R7989 Other specified abnormal findings of blood chemistry: Secondary | ICD-10-CM | POA: Diagnosis not present

## 2019-10-29 LAB — HEPATIC FUNCTION PANEL
AG Ratio: 1.2 (calc) (ref 1.0–2.5)
ALT: 118 U/L — ABNORMAL HIGH (ref 6–29)
AST: 68 U/L — ABNORMAL HIGH (ref 10–35)
Albumin: 4.1 g/dL (ref 3.6–5.1)
Alkaline phosphatase (APISO): 59 U/L (ref 37–153)
Bilirubin, Direct: 0.1 mg/dL (ref 0.0–0.2)
Globulin: 3.3 g/dL (calc) (ref 1.9–3.7)
Indirect Bilirubin: 0.3 mg/dL (calc) (ref 0.2–1.2)
Total Bilirubin: 0.4 mg/dL (ref 0.2–1.2)
Total Protein: 7.4 g/dL (ref 6.1–8.1)

## 2019-11-02 ENCOUNTER — Other Ambulatory Visit: Payer: Self-pay | Admitting: Emergency Medicine

## 2019-11-02 DIAGNOSIS — R7989 Other specified abnormal findings of blood chemistry: Secondary | ICD-10-CM

## 2019-11-02 NOTE — Progress Notes (Signed)
hfp

## 2019-11-20 DIAGNOSIS — E1165 Type 2 diabetes mellitus with hyperglycemia: Secondary | ICD-10-CM | POA: Diagnosis not present

## 2019-11-20 DIAGNOSIS — E7849 Other hyperlipidemia: Secondary | ICD-10-CM | POA: Diagnosis not present

## 2019-11-20 DIAGNOSIS — I10 Essential (primary) hypertension: Secondary | ICD-10-CM | POA: Diagnosis not present

## 2019-12-01 ENCOUNTER — Other Ambulatory Visit (HOSPITAL_COMMUNITY): Payer: Self-pay | Admitting: Internal Medicine

## 2019-12-01 DIAGNOSIS — Z1231 Encounter for screening mammogram for malignant neoplasm of breast: Secondary | ICD-10-CM

## 2019-12-23 ENCOUNTER — Telehealth: Payer: Self-pay | Admitting: Internal Medicine

## 2019-12-23 NOTE — Telephone Encounter (Signed)
Per [place] result 06/2019, pt needs repeat US in one year.  Stacey please update NIC for [place] to 06/2020.

## 2019-12-23 NOTE — Telephone Encounter (Signed)
RECALL FOR ULTRASOUND 

## 2019-12-24 NOTE — Telephone Encounter (Signed)
RECALL UPDATED

## 2019-12-30 ENCOUNTER — Ambulatory Visit (HOSPITAL_COMMUNITY)
Admission: RE | Admit: 2019-12-30 | Discharge: 2019-12-30 | Disposition: A | Discharge status: Home / Self Care | Payer: Medicare Other | Source: Ambulatory Visit | Attending: Internal Medicine | Admitting: Internal Medicine

## 2019-12-30 ENCOUNTER — Other Ambulatory Visit: Payer: Self-pay

## 2019-12-30 DIAGNOSIS — Z1231 Encounter for screening mammogram for malignant neoplasm of breast: Secondary | ICD-10-CM | POA: Diagnosis not present

## 2020-01-21 DIAGNOSIS — I10 Essential (primary) hypertension: Secondary | ICD-10-CM | POA: Diagnosis not present

## 2020-01-21 DIAGNOSIS — E1165 Type 2 diabetes mellitus with hyperglycemia: Secondary | ICD-10-CM | POA: Diagnosis not present

## 2020-01-21 DIAGNOSIS — E7849 Other hyperlipidemia: Secondary | ICD-10-CM | POA: Diagnosis not present

## 2020-02-01 ENCOUNTER — Encounter: Payer: Self-pay | Admitting: Internal Medicine

## 2020-02-18 DIAGNOSIS — Z23 Encounter for immunization: Secondary | ICD-10-CM | POA: Diagnosis not present

## 2020-02-29 ENCOUNTER — Other Ambulatory Visit: Payer: Self-pay

## 2020-02-29 ENCOUNTER — Ambulatory Visit (HOSPITAL_COMMUNITY)
Admission: RE | Admit: 2020-02-29 | Discharge: 2020-02-29 | Disposition: A | Discharge status: Home / Self Care | Payer: Medicare Other | Source: Ambulatory Visit | Attending: Urology | Admitting: Urology

## 2020-02-29 DIAGNOSIS — N200 Calculus of kidney: Secondary | ICD-10-CM | POA: Diagnosis not present

## 2020-02-29 DIAGNOSIS — N281 Cyst of kidney, acquired: Secondary | ICD-10-CM | POA: Diagnosis not present

## 2020-03-01 NOTE — Progress Notes (Signed)
The renal US shows non-obstructing stones and bilateral renal cysts.   She was supposed to have an OV with these results but I don't see that I put in that request.  If she is doing well, she could just return in a year with another Renal US.

## 2020-03-02 ENCOUNTER — Other Ambulatory Visit: Payer: Self-pay

## 2020-03-02 DIAGNOSIS — N200 Calculus of kidney: Secondary | ICD-10-CM

## 2020-03-02 NOTE — Progress Notes (Signed)
Pt called and made aware. Sent via my chart and letter mailed.

## 2020-03-15 DIAGNOSIS — Z23 Encounter for immunization: Secondary | ICD-10-CM | POA: Diagnosis not present

## 2020-03-22 DIAGNOSIS — E1165 Type 2 diabetes mellitus with hyperglycemia: Secondary | ICD-10-CM | POA: Diagnosis not present

## 2020-03-22 DIAGNOSIS — E7849 Other hyperlipidemia: Secondary | ICD-10-CM | POA: Diagnosis not present

## 2020-03-22 DIAGNOSIS — I10 Essential (primary) hypertension: Secondary | ICD-10-CM | POA: Diagnosis not present

## 2020-03-23 ENCOUNTER — Ambulatory Visit (INDEPENDENT_AMBULATORY_CARE_PROVIDER_SITE_OTHER): Payer: Medicare Other | Admitting: Internal Medicine

## 2020-03-23 ENCOUNTER — Other Ambulatory Visit: Payer: Self-pay

## 2020-03-23 ENCOUNTER — Encounter: Payer: Self-pay | Admitting: Internal Medicine

## 2020-03-23 ENCOUNTER — Encounter: Payer: Self-pay | Admitting: *Deleted

## 2020-03-23 VITALS — BP 158/90 | HR 123 | Temp 97.1°F | Ht 65.0 in | Wt 179.4 lb

## 2020-03-23 DIAGNOSIS — Z860100 Personal history of colon polyps, unspecified: Secondary | ICD-10-CM

## 2020-03-23 DIAGNOSIS — R7989 Other specified abnormal findings of blood chemistry: Secondary | ICD-10-CM | POA: Diagnosis not present

## 2020-03-23 DIAGNOSIS — K7581 Nonalcoholic steatohepatitis (NASH): Secondary | ICD-10-CM | POA: Diagnosis not present

## 2020-03-23 NOTE — Progress Notes (Signed)
Referring Provider: Redmond School, MD Primary Care Physician:  Redmond School, MD Primary GI:  Dr. Abbey Chatters  Chief Complaint  Patient presents with  . steatohepatitis  . Colonoscopy    due for 3 yr tcs    HPI:   Tamara Thompson is a 69 y.o. female who presents for follow-up visit.  She is due for surveillance colonoscopy.  Last colonoscopy 3 years ago showed one large sessile serrated polyp and 6 other smaller sessile serrated polyps.  Denies any family history of colorectal malignancy.  No unintentional weight loss.  No abdominal pain.  No change in bowel habits.  No melena hematochezia.  Does have biopsy-proven steatohepatitis with continued elevated LFTs.  She underwent a full serological work-up in the past.  Risk factors include obesity and diabetes.  Last ultrasound March 2021 relatively unchanged.  Past Medical History:  Diagnosis Date  . Anemia   . Bleeding from the nose   . Deviated septum   . Diabetes mellitus without complication ([place])   . Kidney stones   . Medical history non-contributory   . Stroke [facility])     Past Surgical History:  Procedure Laterality Date  . ANKLE SURGERY  2010   left  . CESAREAN SECTION    . COLONOSCOPY N/A 12/14/2013   Dr. Oneida Alar: 10 colon polyps removed, ascending colon sessile serrated adenoma, hyperplastic polyps. Moderate diverticulosis throughout colon. Left colon redundant  . COLONOSCOPY N/A 03/29/2017   Dr. Oneida Alar: multiple serrated adenomas. Surveillance in 3 years   . IR ANGIO INTRA EXTRACRAN SEL COM CAROTID INNOMINATE BILAT MOD SED  01/29/2017  . IR ANGIO VERTEBRAL SEL VERTEBRAL BILAT MOD SED  01/29/2017  . IR RADIOLOGIST EVAL & MGMT  01/17/2017  . IR RADIOLOGIST EVAL & MGMT  04/12/2017  . NASAL ENDOSCOPY WITH EPISTAXIS CONTROL Bilateral 02/07/2015   Procedure: BILATERAL NASAL ENDOSCOPIC CAUTERIZATION;  Surgeon: Leta Baptist, MD;  Location: [place];  Service: ENT;  Laterality: Bilateral;  . NASAL SEPTOPLASTY W/  TURBINOPLASTY Bilateral 02/02/2014   Procedure: RIGHT NASAL SEPTOPLASTY WITH TURBINATE REDUCTION;  Surgeon: Ascencion Dike, MD;  Location: [place];  Service: ENT;  Laterality: Bilateral;  . OVARIAN CYST SURGERY    . POLYPECTOMY Right 02/02/2014   Procedure: RIGHT POLYPECTOMY NASAL;  Surgeon: Ascencion Dike, MD;  Location: [place];  Service: ENT;  Laterality: Right;  . POLYPECTOMY  03/29/2017   Procedure: POLYPECTOMY;  Surgeon: Danie Binder, MD;  [facility];  Service: Endoscopy;;  . Surgery of jaw and cheek fracture      Current Outpatient Medications  Medication Sig Dispense Refill  . acetaminophen (TYLENOL) 500 MG tablet Take 1,000 mg by mouth daily as needed for moderate pain or headache.    . Cholecalciferol 2000 units TABS Take 2,000 Units by mouth daily.     . Coenzyme Q10 (CO Q 10) 100 MG CAPS Take 100 mg by mouth daily.     Marland Kitchen ezetimibe (ZETIA) 10 MG tablet Take 10 mg by mouth daily.    . ferrous sulfate 325 (65 FE) MG tablet Take 325 mg by mouth 3 (three) times daily.     . metFORMIN (GLUCOPHAGE) 500 MG tablet Take 500 mg by mouth 2 (two) times daily.     No current facility-administered medications for this visit.    Allergies as of 03/23/2020 - Review Complete 03/23/2020  Allergen Reaction Noted  . Penicillins  02/28/2012  . Statins Other (See Comments)  01/22/2017  . Percocet [oxycodone-acetaminophen] Rash 01/28/2015    Family History  Problem Relation Age of Onset  . Heart disease Mother   . Cancer Father        lung  . Stroke Father   . Colon cancer Neg Hx     Social History   Socioeconomic History  . Marital status: Married    Spouse name: Not on file  . Number of children: Not on file  . Years of education: Not on file  . Highest education level: Not on file  Occupational History  . Occupation: housewife  Tobacco Use  . Smoking status: Former Smoker    Packs/day: 1.25    Years: 50.00    Pack years: 62.50     Types: Cigarettes    Quit date: 01/27/2010    Years since quitting: 10.1  . Smokeless tobacco: Never Used  Vaping Use  . Vaping Use: Former  Substance and Sexual Activity  . Alcohol use: No  . Drug use: No  . Sexual activity: Not Currently    Birth control/protection: Post-menopausal  Other Topics Concern  . Not on file  Social History Narrative  . Not on file   Social Determinants of Health   Financial Resource Strain:   . Difficulty of Paying Living Expenses: Not on file  Food Insecurity:   . Worried About Charity fundraiser in the Last Year: Not on file  . Ran Out of Food in the Last Year: Not on file  Transportation Needs:   . Lack of Transportation (Medical): Not on file  . Lack of Transportation (Non-Medical): Not on file  Physical Activity:   . Days of Exercise per Week: Not on file  . Minutes of Exercise per Session: Not on file  Stress:   . Feeling of Stress : Not on file  Social Connections:   . Frequency of Communication with Friends and Family: Not on file  . Frequency of Social Gatherings with Friends and Family: Not on file  . Attends Religious Services: Not on file  . Active Member of Clubs or Organizations: Not on file  . Attends Archivist Meetings: Not on file  . Marital Status: Not on file    Subjective: Review of Systems  Constitutional: Negative for chills and fever.  HENT: Negative for congestion and hearing loss.   Eyes: Negative for blurred vision and double vision.  Respiratory: Negative for cough and shortness of breath.   Cardiovascular: Negative for chest pain and palpitations.  Gastrointestinal: Negative for abdominal pain, blood in stool, constipation, diarrhea, heartburn, melena and vomiting.  Genitourinary: Negative for dysuria and urgency.  Musculoskeletal: Negative for joint pain and myalgias.  Skin: Negative for itching and rash.  Neurological: Negative for dizziness and headaches.  Psychiatric/Behavioral: Negative for  depression. The patient is not nervous/anxious.      Objective: BP (!) 158/90   Pulse (!) 123   Temp (!) 97.1 F (36.2 C) (Temporal)   Ht 5' 5"  (1.651 m)   Wt 179 lb 6.4 oz (81.4 kg)   BMI 29.85 kg/m  Physical Exam Constitutional:      Appearance: Normal appearance.  HENT:     Head: Normocephalic and atraumatic.  Eyes:     Extraocular Movements: Extraocular movements intact.     Conjunctiva/sclera: Conjunctivae normal.  Cardiovascular:     Rate and Rhythm: Normal rate and regular rhythm.  Pulmonary:     Effort: Pulmonary effort is normal.     Breath  sounds: Normal breath sounds.  Abdominal:     General: Bowel sounds are normal.     Palpations: Abdomen is soft.  Musculoskeletal:        General: No swelling. Normal range of motion.     Cervical back: Normal range of motion and neck supple.  Skin:    General: Skin is warm and dry.     Coloration: Skin is not jaundiced.  Neurological:     General: No focal deficit present.     Mental Status: She is alert and oriented to person, place, and time.  Psychiatric:        Mood and Affect: Mood normal.        Behavior: Behavior normal.      Assessment: *History of multiple sessile serrated polyps *Steatohepatitis *Abnormal LFTs  Plan: We will schedule patient for surveillance colonoscopy today in clinic.The risks including infection, bleed, or perforation as well as benefits, limitations, alternatives and imponderables have been reviewed with the patient. Questions have been answered. All parties agreeable.  She will follow-up in 3 months to discuss her steatohepatitis.  She will likely need repeat ultrasound at that time.  Recommend 1-2# weight loss per week until ideal body weight through exercise & diet. Low fat/cholesterol diet.   Avoid sweets, sodas, fruit juices, sweetened beverages like tea, etc. Gradually increase exercise from 15 min daily up to 1 hr per day 5 days/week. Limit alcohol use.    03/23/2020 2:21  PM   Disclaimer: This note was dictated with voice recognition software. Similar sounding words can inadvertently be transcribed and may not be corrected upon review.

## 2020-03-23 NOTE — Patient Instructions (Signed)
We will schedule you for surveillance colonoscopy today in clinic due to history of colon polyps.  Further recommendations to follow.  Follow-up in March 2022 with Roseanne Kaufman to discuss fatty liver disease and likely repeat ultrasound.  At [facility] we value your feedback. You may receive a survey about your visit today. Please share your experience as we strive to create trusting relationships with our patients to provide genuine, compassionate, quality care.  We appreciate your understanding and patience as we review any laboratory studies, imaging, and other diagnostic tests that are ordered as we care for you. Our office policy is 5 business days for review of these results, and any emergent or urgent results are addressed in a timely manner for your best interest. If you do not hear from our office in 1 week, please contact us.   We also encourage the use of MyChart, which contains your medical information for your review as well. If you are not enrolled in this feature, an access code is on this after visit summary for your convenience. Thank you for allowing [place] to be involved in your care.  It was great to see you today!  I hope you have a great rest of your winter!!    Elon Alas. Abbey Chatters, D.O. Gastroenterology and Hepatology [facility]

## 2020-03-24 ENCOUNTER — Encounter: Payer: Self-pay | Admitting: *Deleted

## 2020-04-04 ENCOUNTER — Other Ambulatory Visit: Payer: Self-pay

## 2020-04-04 DIAGNOSIS — R7989 Other specified abnormal findings of blood chemistry: Secondary | ICD-10-CM

## 2020-05-02 DIAGNOSIS — R7989 Other specified abnormal findings of blood chemistry: Secondary | ICD-10-CM | POA: Diagnosis not present

## 2020-05-02 LAB — HEPATIC FUNCTION PANEL
AG Ratio: 1.1 (calc) (ref 1.0–2.5)
ALT: 70 U/L — ABNORMAL HIGH (ref 6–29)
AST: 41 U/L — ABNORMAL HIGH (ref 10–35)
Albumin: 4.2 g/dL (ref 3.6–5.1)
Alkaline phosphatase (APISO): 60 U/L (ref 37–153)
Bilirubin, Direct: 0.1 mg/dL (ref 0.0–0.2)
Globulin: 3.7 g/dL (calc) (ref 1.9–3.7)
Indirect Bilirubin: 0.2 mg/dL (calc) (ref 0.2–1.2)
Total Bilirubin: 0.3 mg/dL (ref 0.2–1.2)
Total Protein: 7.9 g/dL (ref 6.1–8.1)

## 2020-05-11 NOTE — Patient Instructions (Signed)
   Your procedure is scheduled on: 05/17/2020  Report to [facility] at   7:30  AM.  Call this number if you have problems the morning of surgery: [phone]   Remember:              Follow Directions on the letter you received from Your Physician's office regarding the Bowel Prep              No Smoking the day of Procedure :   Take these medicines the morning of surgery with A SIP OF WATER: Zetia   Do not wear jewelry, make-up or nail polish.    Do not bring valuables to the hospital.  Contacts, dentures or bridgework may not be worn into surgery.  .   Patients discharged the day of surgery will not be allowed to drive home.     Colonoscopy, Adult, Care After This sheet gives you information about how to care for yourself after your procedure. Your health care provider may also give you more specific instructions. If you have problems or questions, contact your health care provider. What can I expect after the procedure? After the procedure, it is common to have:  A small amount of blood in your stool for 24 hours after the procedure.  Some gas.  Mild abdominal cramping or bloating.  Follow these instructions at home: General instructions   For the first 24 hours after the procedure: ? Do not drive or use machinery. ? Do not sign important documents. ? Do not drink alcohol. ? Do your regular daily activities at a slower pace than normal. ? Eat soft, easy-to-digest foods. ? Rest often.  Take over-the-counter or prescription medicines only as told by your health care provider.  It is up to you to get the results of your procedure. Ask your health care provider, or the department performing the procedure, when your results will be ready. Relieving cramping and bloating  Try walking around when you have cramps or feel bloated.  Apply heat to your abdomen as told by your health care provider. Use a heat source that your health care provider recommends, such as a moist  heat pack or a heating pad. ? Place a towel between your skin and the heat source. ? Leave the heat on for 20-30 minutes. ? Remove the heat if your skin turns bright red. This is especially important if you are unable to feel pain, heat, or cold. You may have a greater risk of getting burned. Eating and drinking  Drink enough fluid to keep your urine clear or pale yellow.  Resume your normal diet as instructed by your health care provider. Avoid heavy or fried foods that are hard to digest.  Avoid drinking alcohol for as long as instructed by your health care provider. Contact a health care provider if:  You have blood in your stool 2-3 days after the procedure. Get help right away if:  You have more than a small spotting of blood in your stool.  You pass large blood clots in your stool.  Your abdomen is swollen.  You have nausea or vomiting.  You have a fever.  You have increasing abdominal pain that is not relieved with medicine. This information is not intended to replace advice given to you by your health care provider. Make sure you discuss any questions you have with your health care provider. Document Released: 11/22/2003 Document Revised: 01/02/2016 Document Reviewed: 06/21/2015 Elsevier Interactive Patient Education  [employer].

## 2020-05-12 ENCOUNTER — Encounter (HOSPITAL_COMMUNITY)
Admission: RE | Admit: 2020-05-12 | Discharge: 2020-05-12 | Disposition: A | Discharge status: Home / Self Care | Payer: Medicare Other | Source: Ambulatory Visit | Attending: Internal Medicine | Admitting: Internal Medicine

## 2020-05-12 ENCOUNTER — Other Ambulatory Visit: Payer: Self-pay

## 2020-05-12 ENCOUNTER — Other Ambulatory Visit (HOSPITAL_COMMUNITY)
Admission: RE | Admit: 2020-05-12 | Discharge: 2020-05-12 | Disposition: A | Discharge status: Home / Self Care | Payer: Medicare Other | Source: Ambulatory Visit | Attending: Internal Medicine | Admitting: Internal Medicine

## 2020-05-12 ENCOUNTER — Encounter (HOSPITAL_COMMUNITY): Payer: Self-pay

## 2020-05-12 DIAGNOSIS — Z20822 Contact with and (suspected) exposure to covid-19: Secondary | ICD-10-CM | POA: Insufficient documentation

## 2020-05-12 DIAGNOSIS — Z8673 Personal history of transient ischemic attack (TIA), and cerebral infarction without residual deficits: Secondary | ICD-10-CM | POA: Diagnosis not present

## 2020-05-12 DIAGNOSIS — Z01818 Encounter for other preprocedural examination: Secondary | ICD-10-CM | POA: Diagnosis not present

## 2020-05-12 DIAGNOSIS — Z01810 Encounter for preprocedural cardiovascular examination: Secondary | ICD-10-CM | POA: Insufficient documentation

## 2020-05-12 DIAGNOSIS — Z01812 Encounter for preprocedural laboratory examination: Secondary | ICD-10-CM | POA: Insufficient documentation

## 2020-05-12 HISTORY — DX: Personal history of urinary calculi: Z87.442

## 2020-05-13 ENCOUNTER — Encounter (HOSPITAL_COMMUNITY)
Admission: RE | Admit: 2020-05-13 | Discharge: 2020-05-13 | Disposition: A | Discharge status: Home / Self Care | Payer: Medicare Other | Source: Ambulatory Visit | Attending: Internal Medicine | Admitting: Internal Medicine

## 2020-05-13 ENCOUNTER — Other Ambulatory Visit (HOSPITAL_COMMUNITY): Payer: Medicare Other

## 2020-05-13 LAB — SARS CORONAVIRUS 2 (TAT 6-24 HRS): SARS Coronavirus 2: NEGATIVE

## 2020-05-17 ENCOUNTER — Encounter (HOSPITAL_COMMUNITY)
Admission: RE | Disposition: A | Discharge status: Home / Self Care | Payer: Self-pay | Source: Home / Self Care | Attending: Internal Medicine

## 2020-05-17 ENCOUNTER — Ambulatory Visit (HOSPITAL_COMMUNITY)
Admission: RE | Admit: 2020-05-17 | Discharge: 2020-05-17 | Disposition: A | Discharge status: Home / Self Care | Payer: Medicare Other | Attending: Internal Medicine | Admitting: Internal Medicine

## 2020-05-17 ENCOUNTER — Other Ambulatory Visit: Payer: Self-pay

## 2020-05-17 ENCOUNTER — Ambulatory Visit (HOSPITAL_COMMUNITY): Payer: Medicare Other | Admitting: Anesthesiology

## 2020-05-17 ENCOUNTER — Encounter (HOSPITAL_COMMUNITY): Payer: Self-pay

## 2020-05-17 DIAGNOSIS — Z79899 Other long term (current) drug therapy: Secondary | ICD-10-CM | POA: Insufficient documentation

## 2020-05-17 DIAGNOSIS — Z7984 Long term (current) use of oral hypoglycemic drugs: Secondary | ICD-10-CM | POA: Diagnosis not present

## 2020-05-17 DIAGNOSIS — Z885 Allergy status to narcotic agent status: Secondary | ICD-10-CM | POA: Insufficient documentation

## 2020-05-17 DIAGNOSIS — Z880 Allergy status to penicillin: Secondary | ICD-10-CM | POA: Insufficient documentation

## 2020-05-17 DIAGNOSIS — Z1211 Encounter for screening for malignant neoplasm of colon: Secondary | ICD-10-CM | POA: Insufficient documentation

## 2020-05-17 DIAGNOSIS — Z86010 Personal history of colonic polyps: Secondary | ICD-10-CM | POA: Diagnosis not present

## 2020-05-17 DIAGNOSIS — K648 Other hemorrhoids: Secondary | ICD-10-CM | POA: Diagnosis not present

## 2020-05-17 DIAGNOSIS — K5730 Diverticulosis of large intestine without perforation or abscess without bleeding: Secondary | ICD-10-CM | POA: Diagnosis not present

## 2020-05-17 DIAGNOSIS — Z888 Allergy status to other drugs, medicaments and biological substances status: Secondary | ICD-10-CM | POA: Insufficient documentation

## 2020-05-17 DIAGNOSIS — K635 Polyp of colon: Secondary | ICD-10-CM

## 2020-05-17 DIAGNOSIS — Z87891 Personal history of nicotine dependence: Secondary | ICD-10-CM | POA: Diagnosis not present

## 2020-05-17 DIAGNOSIS — D123 Benign neoplasm of transverse colon: Secondary | ICD-10-CM | POA: Diagnosis not present

## 2020-05-17 DIAGNOSIS — E119 Type 2 diabetes mellitus without complications: Secondary | ICD-10-CM | POA: Diagnosis not present

## 2020-05-17 HISTORY — PX: POLYPECTOMY: SHX5525

## 2020-05-17 HISTORY — PX: COLONOSCOPY WITH PROPOFOL: SHX5780

## 2020-05-17 LAB — GLUCOSE, CAPILLARY: Glucose-Capillary: 163 mg/dL — ABNORMAL HIGH (ref 70–99)

## 2020-05-17 SURGERY — COLONOSCOPY WITH PROPOFOL
Anesthesia: General

## 2020-05-17 MED ORDER — LACTATED RINGERS IV SOLN
INTRAVENOUS | Status: DC
  Administered 2020-05-17: 1000 mL via INTRAVENOUS

## 2020-05-17 MED ORDER — STERILE WATER FOR IRRIGATION IR SOLN
Status: DC | PRN
  Administered 2020-05-17: 100 mL

## 2020-05-17 MED ORDER — PROPOFOL 10 MG/ML IV BOLUS
INTRAVENOUS | Status: DC | PRN
  Administered 2020-05-17: 100 mg via INTRAVENOUS
  Administered 2020-05-17: 125 ug/kg/min via INTRAVENOUS

## 2020-05-17 NOTE — Discharge Instructions (Addendum)
Colonoscopy Discharge Instructions  Read the instructions outlined below and refer to this sheet in the next few weeks. These discharge instructions provide you with general information on caring for yourself after you leave the hospital. Your doctor may also give you specific instructions. While your treatment has been planned according to the most current medical practices available, unavoidable complications occasionally occur.   ACTIVITY  You may resume your regular activity, but move at a slower pace for the next 24 hours.   Take frequent rest periods for the next 24 hours.   Walking will help get rid of the air and reduce the bloated feeling in your belly (abdomen).   No driving for 24 hours (because of the medicine (anesthesia) used during the test).    Do not sign any important legal documents or operate any machinery for 24 hours (because of the anesthesia used during the test).  NUTRITION  Drink plenty of fluids.   You may resume your normal diet as instructed by your doctor.   Begin with a light meal and progress to your normal diet. Heavy or fried foods are harder to digest and may make you feel sick to your stomach (nauseated).   Avoid alcoholic beverages for 24 hours or as instructed.  MEDICATIONS  You may resume your normal medications unless your doctor tells you otherwise.  WHAT YOU CAN EXPECT TODAY  Some feelings of bloating in the abdomen.   Passage of more gas than usual.   Spotting of blood in your stool or on the toilet paper.  IF YOU HAD POLYPS REMOVED DURING THE COLONOSCOPY:  No aspirin products for 7 days or as instructed.   No alcohol for 7 days or as instructed.   Eat a soft diet for the next 24 hours.  FINDING OUT THE RESULTS OF YOUR TEST Not all test results are available during your visit. If your test results are not back during the visit, make an appointment with your caregiver to find out the results. Do not assume everything is normal if  you have not heard from your caregiver or the medical facility. It is important for you to follow up on all of your test results.  SEEK IMMEDIATE MEDICAL ATTENTION IF:  You have more than a spotting of blood in your stool.   Your belly is swollen (abdominal distention).   You are nauseated or vomiting.   You have a temperature over 101.   You have abdominal pain or discomfort that is severe or gets worse throughout the day.   Your colonoscopy revealed 5 polyp(s) which I removed successfully. Await pathology results, my office will contact you. I recommend repeating colonoscopy in 5 years for surveillance purposes.    I hope you have a great rest of your week!  Elon Alas. Abbey Chatters, D.O. Gastroenterology and Hepatology [facility]   Colon Polyps  Colon polyps are tissue growths inside the colon, which is part of the large intestine. They are one of the types of polyps that can grow in the body. A polyp may be a round bump or a mushroom-shaped growth. You could have one polyp or more than one. Most colon polyps are noncancerous (benign). However, some colon polyps can become cancerous over time. Finding and removing the polyps early can help prevent this. What are the causes? The exact cause of colon polyps is not known. What increases the risk? The following factors may make you more likely to develop this condition:  Having a family  history of colorectal cancer or colon polyps.  Being older than 71 years of age.  Being younger than 71 years of age and having a significant family history of colorectal cancer or colon polyps or a genetic condition that puts you at higher risk of getting colon polyps.  Having inflammatory bowel disease, such as ulcerative colitis or Crohn's disease.  Having certain conditions passed from parent to child (hereditary conditions), such as: ? Familial adenomatous polyposis (FAP). ? Lynch syndrome. ? Turcot  syndrome. ? Peutz-Jeghers syndrome. ? MUTYH-associated polyposis (MAP).  Being overweight.  Certain lifestyle factors. These include smoking cigarettes, drinking too much alcohol, not getting enough exercise, and eating a diet that is high in fat and red meat and low in fiber.  Having had childhood cancer that was treated with radiation of the abdomen. What are the signs or symptoms? Many times, there are no symptoms. If you have symptoms, they may include:  Blood coming from the rectum during a bowel movement.  Blood in the stool (feces). The blood may be bright red or very dark in color.  Pain in the abdomen.  A change in bowel habits, such as constipation or diarrhea. How is this diagnosed? This condition is diagnosed with a colonoscopy. This is a procedure in which a lighted, flexible scope is inserted into the opening between the buttocks (anus) and then passed into the colon to examine the area. Polyps are sometimes found when a colonoscopy is done as part of routine cancer screening tests. How is this treated? This condition is treated by removing any polyps that are found. Most polyps can be removed during a colonoscopy. Those polyps will then be tested for cancer. Additional treatment may be needed depending on the results of testing. Follow these instructions at home: Eating and drinking  Eat foods that are high in fiber, such as fruits, vegetables, and whole grains.  Eat foods that are high in calcium and vitamin D, such as milk, cheese, yogurt, eggs, liver, fish, and broccoli.  Limit foods that are high in fat, such as fried foods and desserts.  Limit the amount of red meat, precooked or cured meat, or other processed meat that you eat, such as hot dogs, sausages, bacon, or meat loaves.  Limit sugary drinks.   Lifestyle  Maintain a healthy weight, or lose weight if recommended by your health care provider.  Exercise every day or as told by your health care  provider.  Do not use any products that contain nicotine or tobacco, such as cigarettes, e-cigarettes, and chewing tobacco. If you need help quitting, ask your health care provider.  Do not drink alcohol if: ? Your health care provider tells you not to drink. ? You are pregnant, may be pregnant, or are planning to become pregnant.  If you drink alcohol: ? Limit how much you use to:  0-1 drink a day for women.  0-2 drinks a day for men. ? Know how much alcohol is in your drink. In the [place] one drink equals one 12 oz bottle of beer (355 mL), one 5 oz glass of wine (148 mL), or one 1 oz glass of hard liquor (44 mL). General instructions  Take over-the-counter and prescription medicines only as told by your health care provider.  Keep all follow-up visits. This is important. This includes having regularly scheduled colonoscopies. Talk to your health care provider about when you need a colonoscopy. Contact a health care provider if:  You have new or worsening bleeding  during a bowel movement.  You have new or increased blood in your stool.  You have a change in bowel habits.  You lose weight for no known reason. Summary  Colon polyps are tissue growths inside the colon, which is part of the large intestine. They are one type of polyp that can grow in the body.  Most colon polyps are noncancerous (benign), but some can become cancerous over time.  This condition is diagnosed with a colonoscopy.  This condition is treated by removing any polyps that are found. Most polyps can be removed during a colonoscopy. This information is not intended to replace advice given to you by your health care provider. Make sure you discuss any questions you have with your health care provider. Document Revised: 07/29/2019 Document Reviewed: 07/29/2019 Elsevier Patient Education  2021 [employer].

## 2020-05-17 NOTE — H&P (Signed)
Primary Care Physician:  Redmond School, MD Primary Gastroenterologist:  Dr. Abbey Chatters  Pre-Procedure History & Physical: HPI:  Tamara Thompson is a 70 y.o. female is here for a colonoscopy for surveillance due to personal history of adenomatous colon polyps. Last colonoscopy 3 years ago showed one large sessile serrated polyp and 6 other smaller sessile serrated polyps.  Denies any family history of colorectal malignancy.  No unintentional weight loss.  No abdominal pain.  No change in bowel habits.  No melena hematochezia  Past Medical History:  Diagnosis Date  . Anemia   . Bleeding from the nose   . Deviated septum   . Diabetes mellitus without complication ([place])   . History of kidney stones   . Kidney stones   . Medical history non-contributory   . Stroke [facility]) 2019    Past Surgical History:  Procedure Laterality Date  . ANKLE SURGERY  2010   left  . CESAREAN SECTION    . COLONOSCOPY N/A 12/14/2013   Dr. Oneida Alar: 10 colon polyps removed, ascending colon sessile serrated adenoma, hyperplastic polyps. Moderate diverticulosis throughout colon. Left colon redundant  . COLONOSCOPY N/A 03/29/2017   Dr. Oneida Alar: multiple serrated adenomas. Surveillance in 3 years   . IR ANGIO INTRA EXTRACRAN SEL COM CAROTID INNOMINATE BILAT MOD SED  01/29/2017  . IR ANGIO VERTEBRAL SEL VERTEBRAL BILAT MOD SED  01/29/2017  . IR RADIOLOGIST EVAL & MGMT  01/17/2017  . IR RADIOLOGIST EVAL & MGMT  04/12/2017  . NASAL ENDOSCOPY WITH EPISTAXIS CONTROL Bilateral 02/07/2015   Procedure: BILATERAL NASAL ENDOSCOPIC CAUTERIZATION;  Surgeon: Leta Baptist, MD;  Location: [place];  Service: ENT;  Laterality: Bilateral;  . NASAL SEPTOPLASTY W/ TURBINOPLASTY Bilateral 02/02/2014   Procedure: RIGHT NASAL SEPTOPLASTY WITH TURBINATE REDUCTION;  Surgeon: Ascencion Dike, MD;  Location: [place];  Service: ENT;  Laterality: Bilateral;  . OVARIAN CYST SURGERY    . POLYPECTOMY Right 02/02/2014    Procedure: RIGHT POLYPECTOMY NASAL;  Surgeon: Ascencion Dike, MD;  Location: [place];  Service: ENT;  Laterality: Right;  . POLYPECTOMY  03/29/2017   Procedure: POLYPECTOMY;  Surgeon: Danie Binder, MD;  [facility];  Service: Endoscopy;;  . Surgery of jaw and cheek fracture      Prior to Admission medications   Medication Sig Start Date End Date Taking? Authorizing Provider  acetaminophen (TYLENOL) 500 MG tablet Take 1,000 mg by mouth daily as needed for moderate pain or headache.   Yes [provider]  B Complex Vitamins (B COMPLEX PO) Take 0.5 tablets by mouth daily.   Yes [provider]  Cholecalciferol 2000 units TABS Take 2,000 Units by mouth daily.    Yes [provider]  Coenzyme Q10 (CO Q 10) 100 MG CAPS Take 100 mg by mouth daily.    Yes [provider]  ezetimibe (ZETIA) 10 MG tablet Take 10 mg by mouth daily. 11/02/16  Yes [provider]  ferrous sulfate 325 (65 FE) MG tablet Take 325 mg by mouth 3 (three) times daily.    Yes [provider]  metFORMIN (GLUCOPHAGE) 500 MG tablet Take 500 mg by mouth 2 (two) times daily. 11/02/16  Yes [provider]    Allergies as of 03/23/2020 - Review Complete 03/23/2020  Allergen Reaction Noted  . Penicillins  02/28/2012  . Statins Other (See Comments) 01/22/2017  . Percocet [oxycodone-acetaminophen] Rash 01/28/2015    Family History  Problem Relation Age  of Onset  . Heart disease Mother   . Cancer Father        lung  . Stroke Father   . Colon cancer Neg Hx     Social History   Socioeconomic History  . Marital status: Married    Spouse name: Not on file  . Number of children: Not on file  . Years of education: Not on file  . Highest education level: Not on file  Occupational History  . Occupation: housewife  Tobacco Use  . Smoking status: Former Smoker    Packs/day: 1.25    Years: 50.00    Pack years: 62.50    Types: Cigarettes     Quit date: 01/27/2010    Years since quitting: 10.3  . Smokeless tobacco: Never Used  Vaping Use  . Vaping Use: Never used  Substance and Sexual Activity  . Alcohol use: No  . Drug use: No  . Sexual activity: Not Currently    Birth control/protection: Post-menopausal  Other Topics Concern  . Not on file  Social History Narrative  . Not on file   Social Determinants of Health   Financial Resource Strain: Not on file  Food Insecurity: Not on file  Transportation Needs: Not on file  Physical Activity: Not on file  Stress: Not on file  Social Connections: Not on file  Intimate Partner Violence: Not on file    Review of Systems: See HPI, otherwise negative ROS  Impression/Plan: Tamara Thompson is here for a colonoscopy for surveillance due to personal history of adenomatous colon polyps.  The risks of the procedure including infection, bleed, or perforation as well as benefits, limitations, alternatives and imponderables have been reviewed with the patient. Questions have been answered. All parties agreeable.

## 2020-05-17 NOTE — Op Note (Signed)
[facility] Patient Name: Tamara Thompson Procedure Date: 05/17/2020 9:17 AM MRN: [id number] Date of Birth: 1950/10/28 Attending MD: Elon Alas. Edgar Frisk CSN: [id number] Age: 70 Admit Type: Outpatient Procedure:                Colonoscopy Indications:              High risk colon cancer surveillance: Personal                            history of colonic polyps Providers:                Elon Alas. Abbey Chatters, DO, Lambert Mody, Nelma Rothman, Technician Referring MD:              Medicines:                See the Anesthesia note for documentation of the                            administered medications Complications:            No immediate complications. Estimated Blood Loss:     Estimated blood loss was minimal. Procedure:                Pre-Anesthesia Assessment:                           - The anesthesia plan was to use monitored                            anesthesia care (MAC).                           After obtaining informed consent, the colonoscope                            was passed under direct vision. Throughout the                            procedure, the patient's blood pressure, pulse, and                            oxygen saturations were monitored continuously. The                            [id number]-HQ190L ([id number]) scope was introduced through                            the anus and advanced to the the cecum, identified                            by appendiceal orifice and ileocecal valve. The                            colonoscopy was performed without difficulty. The  patient tolerated the procedure well. The quality                            of the bowel preparation was evaluated using the                            BBPS [facility] Bowel Preparation Scale) with scores                            of: Right Colon = 2 (minor amount of residual                            staining, small fragments of stool and/or  opaque                            liquid, but mucosa seen well), Transverse Colon = 2                            (minor amount of residual staining, small fragments                            of stool and/or opaque liquid, but mucosa seen                            well) and Left Colon = 2 (minor amount of residual                            staining, small fragments of stool and/or opaque                            liquid, but mucosa seen well). The total BBPS score                            equals 6. The quality of the bowel preparation was                            fair. Scope In: 9:26:13 AM Scope Out: 9:45:39 AM Scope Withdrawal Time: 0 hours 13 minutes 53 seconds  Total Procedure Duration: 0 hours 19 minutes 26 seconds  Findings:      The perianal and digital rectal examinations were normal.      Non-bleeding internal hemorrhoids were found during endoscopy.      Multiple small-mouthed diverticula were found in the sigmoid colon and       descending colon.      Five sessile polyps were found in the sigmoid colon, descending colon       and transverse colon. The polyps were 4 to 8 mm in size. These polyps       were removed with a cold snare. Resection and retrieval were complete.      The exam was otherwise without abnormality. Impression:               - Preparation of the colon was fair.                           -  Non-bleeding internal hemorrhoids.                           - Diverticulosis in the sigmoid colon and in the                            descending colon.                           - Five 4 to 8 mm polyps in the sigmoid colon, in                            the descending colon and in the transverse colon,                            removed with a cold snare. Resected and retrieved.                           - The examination was otherwise normal. Moderate Sedation:      Per Anesthesia Care Recommendation:           - Patient has a contact number available for                             emergencies. The signs and symptoms of potential                            delayed complications were discussed with the                            patient. Return to normal activities tomorrow.                            Written discharge instructions were provided to the                            patient.                           - Resume previous diet.                           - Continue present medications.                           - Await pathology results.                           - Repeat colonoscopy in 5 years for surveillance.                           - Return to GI clinic PRN. Procedure Code(s):        --- Professional ---                           [phone], Colonoscopy, flexible; with removal of  tumor(s), polyp(s), or other lesion(s) by snare                            technique Diagnosis Code(s):        --- Professional ---                           Z86.010, Personal history of colonic polyps                           K64.8, Other hemorrhoids                           K63.5, Polyp of colon                           K57.30, Diverticulosis of large intestine without                            perforation or abscess without bleeding CPT copyright 2019 American Medical Association. All rights reserved. The codes documented in this report are preliminary and upon coder review may  be revised to meet current compliance requirements. Elon Alas. Abbey Chatters, DO [place] Abbey Chatters, DO 05/17/2020 9:49:46 AM This report has been signed electronically. Number of Addenda: 0

## 2020-05-17 NOTE — Anesthesia Postprocedure Evaluation (Signed)
Anesthesia Post Note  Patient: Tamara Thompson  Procedure(s) Performed: COLONOSCOPY WITH PROPOFOL (N/A ) POLYPECTOMY  Patient location during evaluation: PACU Anesthesia Type: General Level of consciousness: awake, oriented, awake and alert and patient cooperative Pain management: pain level controlled Vital Signs Assessment: post-procedure vital signs reviewed and stable Respiratory status: spontaneous breathing, respiratory function stable and nonlabored ventilation Cardiovascular status: blood pressure returned to baseline and stable Postop Assessment: no headache and no backache Anesthetic complications: no   No complications documented.   Last Vitals:  Vitals:   05/17/20 0805  BP: (!) 137/93  Resp: 18  Temp: 36.8 C  SpO2: 97%    Last Pain:  Vitals:   05/17/20 0925  TempSrc:   PainSc: 0-No pain                 Tacy Learn

## 2020-05-17 NOTE — Transfer of Care (Signed)
Immediate Anesthesia Transfer of Care Note  Patient: Tamara Thompson  Procedure(s) Performed: COLONOSCOPY WITH PROPOFOL (N/A ) POLYPECTOMY  Patient Location: PACU  Anesthesia Type:General  Level of Consciousness: awake, alert , oriented and patient cooperative  Airway & Oxygen Therapy: Patient Spontanous Breathing and Patient connected to nasal cannula oxygen  Post-op Assessment: Report given to RN, Post -op Vital signs reviewed and stable and Patient moving all extremities  Post vital signs: Reviewed and stable  Last Vitals:  Vitals Value Taken Time  BP    Temp    Pulse    Resp    SpO2      Last Pain:  Vitals:   05/17/20 0925  TempSrc:   PainSc: 0-No pain      Patients Stated Pain Goal: 6 (19/16/60 6004)  Complications: No complications documented.

## 2020-05-17 NOTE — Anesthesia Preprocedure Evaluation (Signed)
Anesthesia Evaluation  Patient identified by MRN, date of birth, ID band Patient awake    Reviewed: Allergy & Precautions, H&P , NPO status , Patient's Chart, lab work & pertinent test results, reviewed documented beta blocker date and time   Airway Mallampati: II  TM Distance: >3 FB Neck ROM: full    Dental no notable dental hx. (+) Missing   Pulmonary neg pulmonary ROS, former smoker,    Pulmonary exam normal breath sounds clear to auscultation       Cardiovascular Exercise Tolerance: Good negative cardio ROS   Rhythm:regular Rate:Normal     Neuro/Psych CVA negative psych ROS   GI/Hepatic negative GI ROS, (+) Hepatitis -  Endo/Other  negative endocrine ROSdiabetes  Renal/GU Renal disease  negative genitourinary   Musculoskeletal   Abdominal   Peds  Hematology  (+) Blood dyscrasia, anemia ,   Anesthesia Other Findings   Reproductive/Obstetrics negative OB ROS                             Anesthesia Physical Anesthesia Plan  ASA: III  Anesthesia Plan: General   Post-op Pain Management:    Induction:   PONV Risk Score and Plan: Propofol infusion and TIVA  Airway Management Planned:   Additional Equipment:   Intra-op Plan:   Post-operative Plan:   Informed Consent: I have reviewed the patients History and Physical, chart, labs and discussed the procedure including the risks, benefits and alternatives for the proposed anesthesia with the patient or authorized representative who has indicated his/her understanding and acceptance.     Dental Advisory Given  Plan Discussed with: CRNA  Anesthesia Plan Comments:         Anesthesia Quick Evaluation

## 2020-05-18 LAB — SURGICAL PATHOLOGY

## 2020-05-19 ENCOUNTER — Encounter (HOSPITAL_COMMUNITY): Payer: Self-pay | Admitting: Internal Medicine

## 2020-06-13 ENCOUNTER — Telehealth: Payer: Self-pay | Admitting: Internal Medicine

## 2020-06-13 NOTE — Telephone Encounter (Signed)
Letter mailed

## 2020-06-13 NOTE — Telephone Encounter (Signed)
Recall for ultrasound 

## 2020-06-22 ENCOUNTER — Telehealth: Payer: Self-pay | Admitting: Internal Medicine

## 2020-06-22 DIAGNOSIS — K7581 Nonalcoholic steatohepatitis (NASH): Secondary | ICD-10-CM

## 2020-06-22 DIAGNOSIS — R7989 Other specified abnormal findings of blood chemistry: Secondary | ICD-10-CM

## 2020-06-22 NOTE — Telephone Encounter (Signed)
[phone] PATIENT RECEIVED LETTER TO SCHEDULE ULTRASOUND

## 2020-06-22 NOTE — Telephone Encounter (Signed)
Called [place] scheduled for 3/9 at 10:30am, arrival 10:15am, npo midnight. Called pt and is aware of appt details. She voiced understanding

## 2020-06-22 NOTE — Addendum Note (Signed)
Addended by: Cheron Every on: 06/22/2020 11:46 AM   Modules accepted: Orders

## 2020-06-27 IMAGING — US US RENAL
1 series · 14 of 25 positions shown · non-contrast
Comparison: Renal ultrasound dated March 04, 2017.

CLINICAL DATA: Renal cysts.

EXAM:
RENAL / URINARY TRACT ULTRASOUND COMPLETE

[Series 1: us renal · 0.23mm/px · 56 acquisitions, 14 frames shown]
[im 1/56]
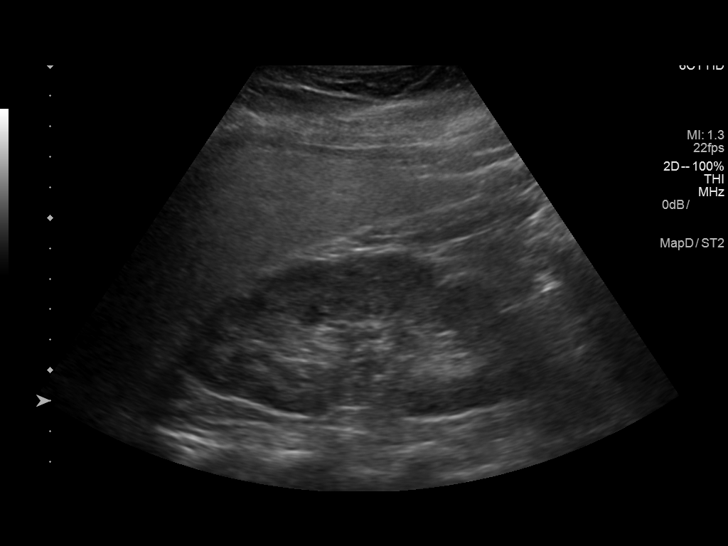
[im 5/56]
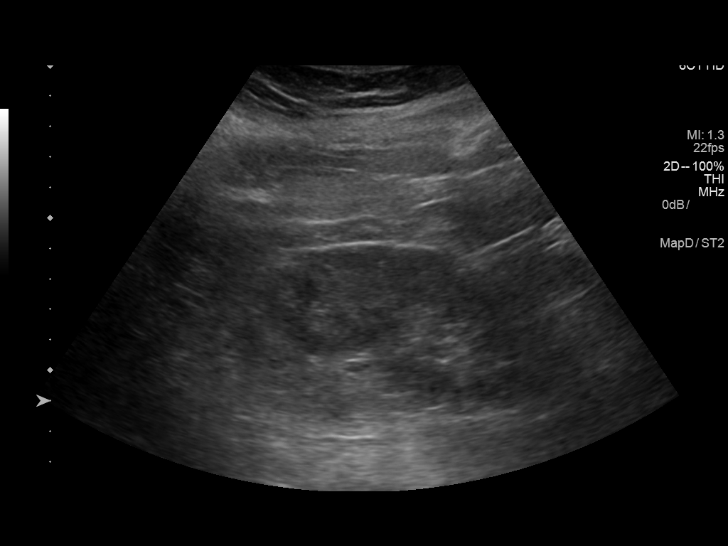
[im 10/56]
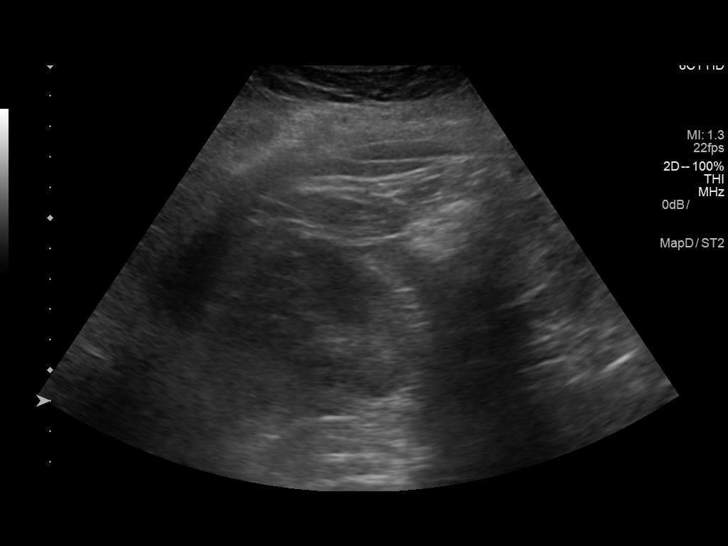
[im 14/56]
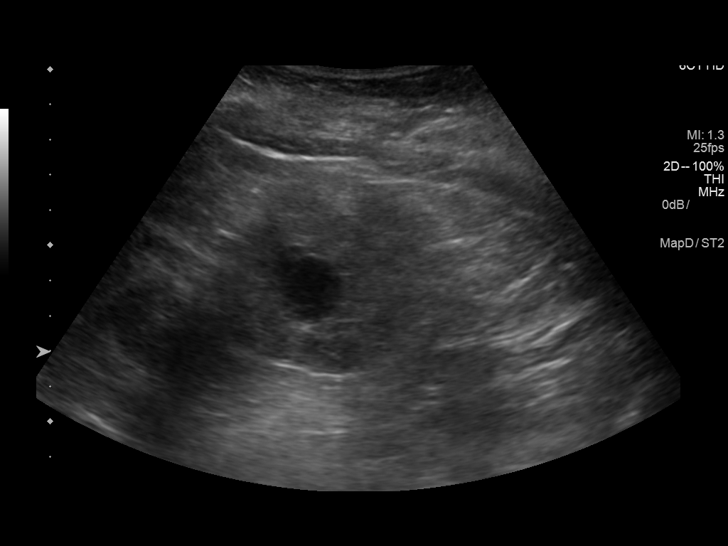
[im 19/56]
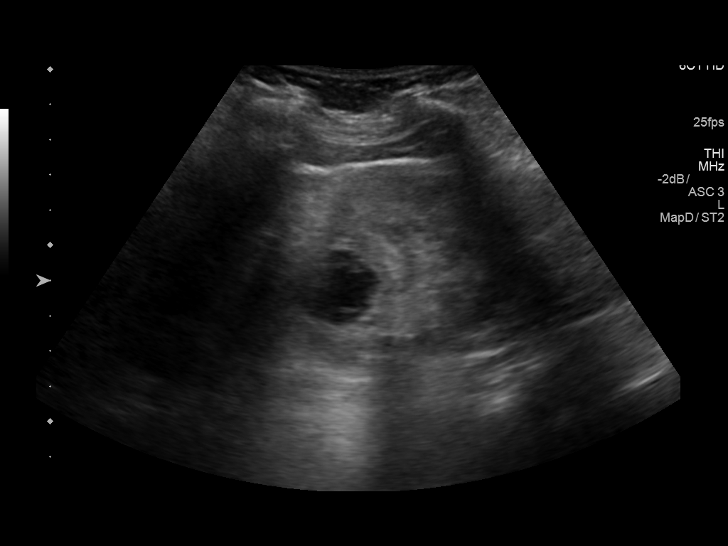
[im 21/56]
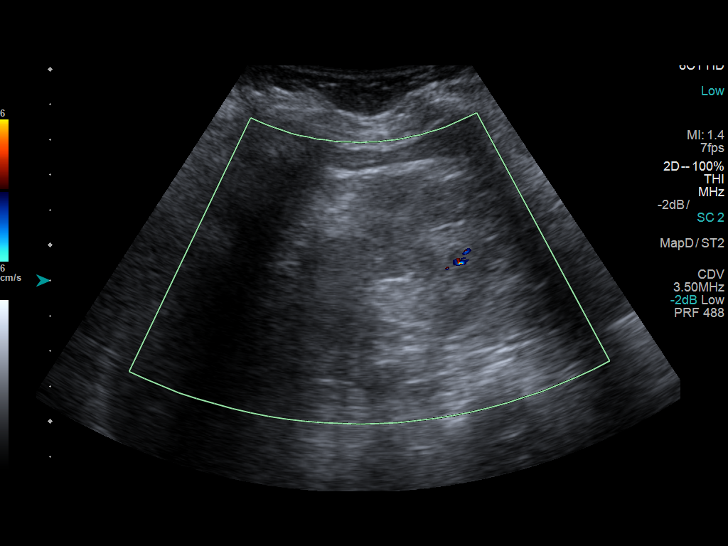
[im 26/56]
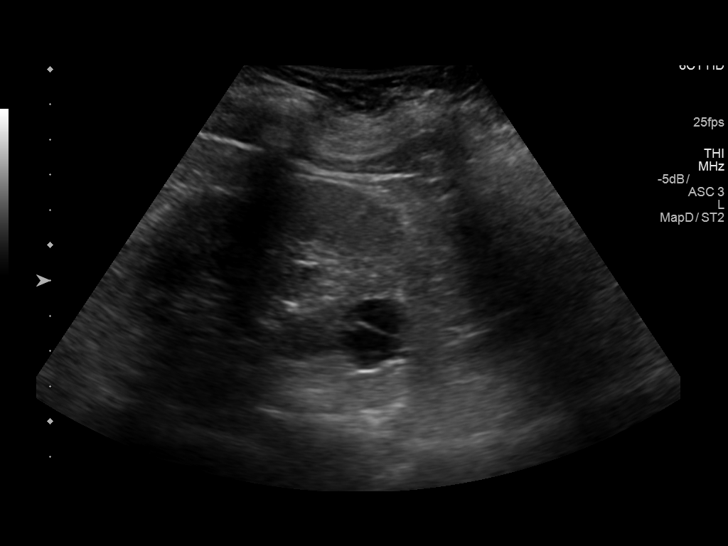
[im 30/56]
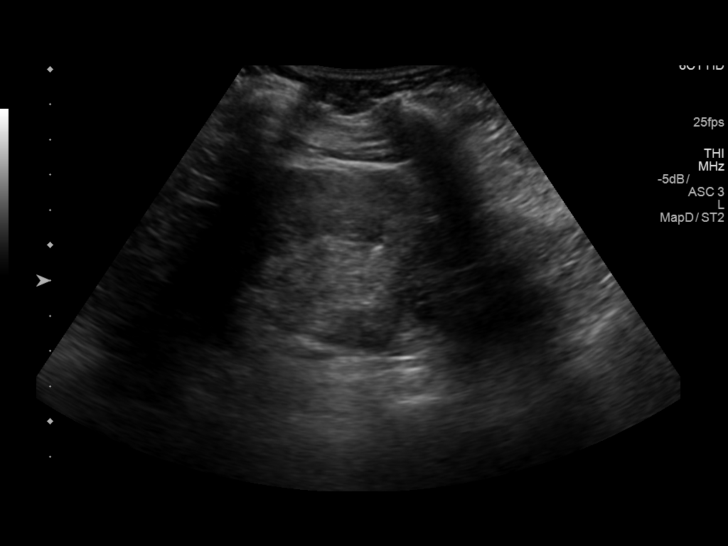
[im 35/56]
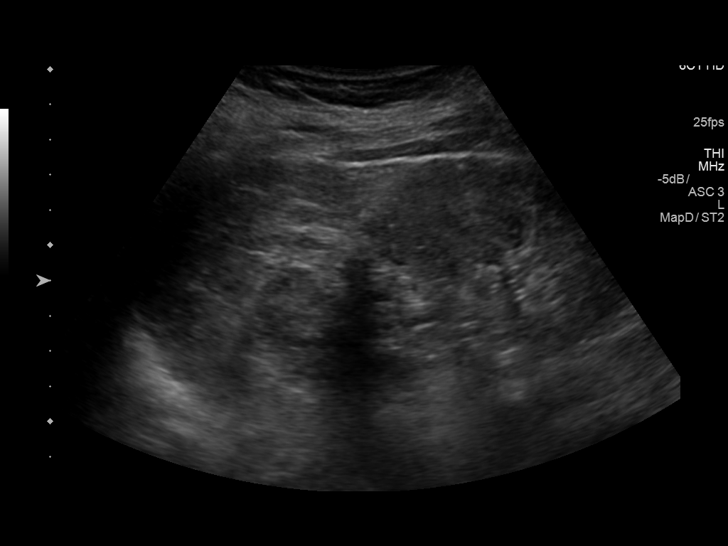
[im 37/56]
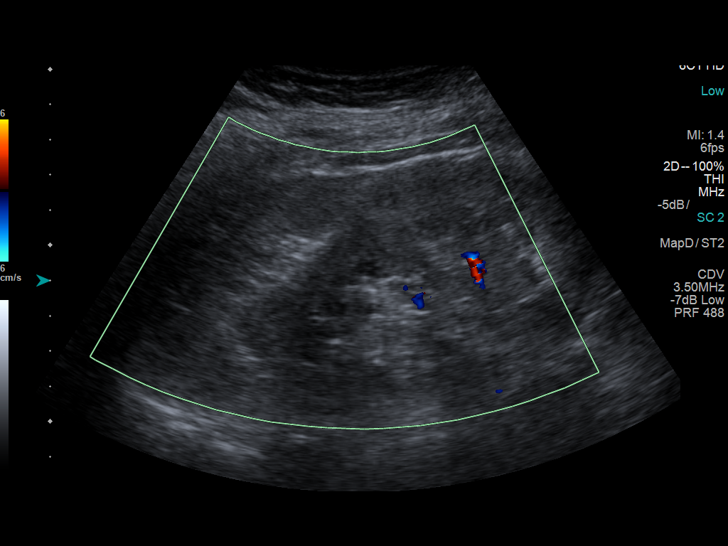
[im 42/56]
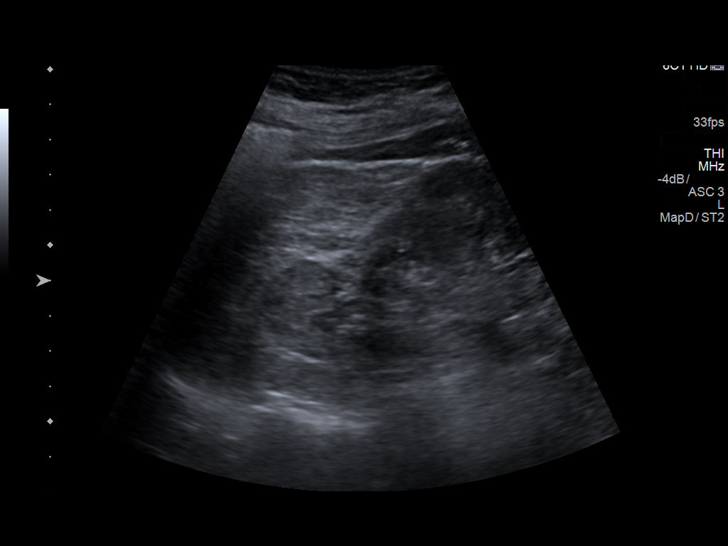
[im 46/56]
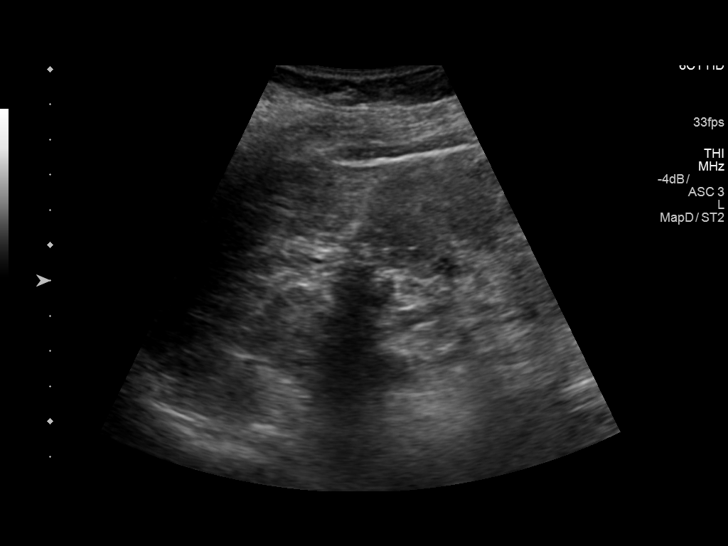
[im 51/56]
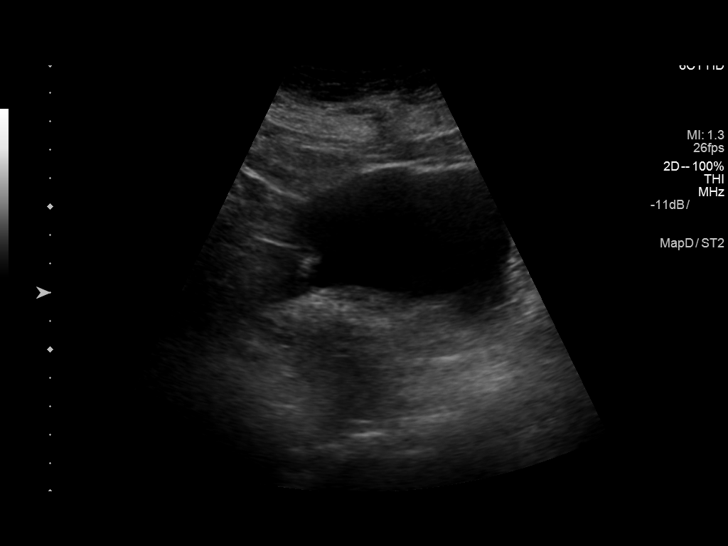
[im 56/56]
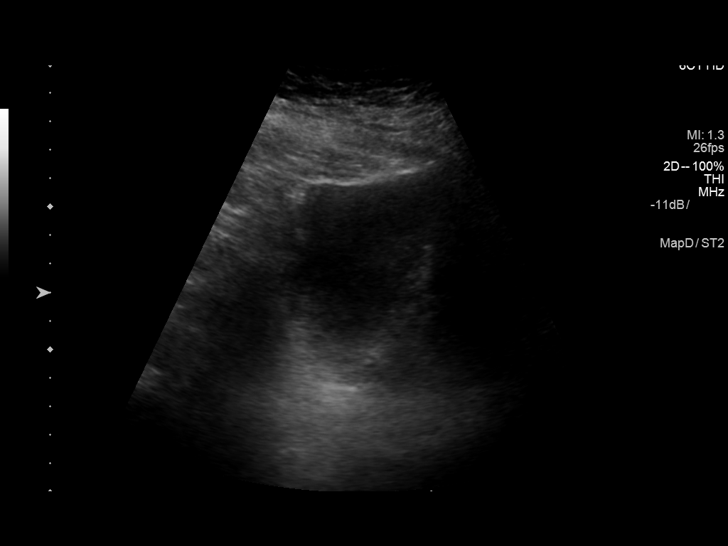

[14 of 25 positions shown; findings below may reference images not displayed]

FINDINGS: Right Kidney:

Renal measurements: 12.3 x 6.0 x 7.1 cm = volume: 272 mL .
Echogenicity within normal limits. No mass or hydronephrosis
visualized.

Left Kidney:

Renal measurements: 10.2 x 5.2 x 5.0 cm = volume: 138 mL.
Echogenicity within normal limits. No hydronephrosis. Two minimally
complex cysts measuring up to 2.3 cm are not significantly changed
when compared to prior study. Prominent, lobulated area at the upper
pole likely reflects normal renal parenchyma and appears unchanged
in appearance when compared to prior CT from 0961.

Bladder:

Appears normal for degree of bladder distention.
IMPRESSION: 1. Two minimally complex cysts in the left kidney are not
significantly changed since [DATE].

## 2020-06-29 ENCOUNTER — Ambulatory Visit (HOSPITAL_COMMUNITY)
Admission: RE | Admit: 2020-06-29 | Discharge: 2020-06-29 | Disposition: A | Discharge status: Home / Self Care | Payer: Medicare Other | Source: Ambulatory Visit | Attending: Gastroenterology | Admitting: Gastroenterology

## 2020-06-29 DIAGNOSIS — R7989 Other specified abnormal findings of blood chemistry: Secondary | ICD-10-CM | POA: Diagnosis not present

## 2020-06-29 DIAGNOSIS — K7581 Nonalcoholic steatohepatitis (NASH): Secondary | ICD-10-CM | POA: Diagnosis not present

## 2020-06-29 DIAGNOSIS — K760 Fatty (change of) liver, not elsewhere classified: Secondary | ICD-10-CM | POA: Diagnosis not present

## 2020-07-20 DIAGNOSIS — E7849 Other hyperlipidemia: Secondary | ICD-10-CM | POA: Diagnosis not present

## 2020-07-20 DIAGNOSIS — I10 Essential (primary) hypertension: Secondary | ICD-10-CM | POA: Diagnosis not present

## 2020-07-20 DIAGNOSIS — E1165 Type 2 diabetes mellitus with hyperglycemia: Secondary | ICD-10-CM | POA: Diagnosis not present

## 2020-08-12 DIAGNOSIS — E7849 Other hyperlipidemia: Secondary | ICD-10-CM | POA: Diagnosis not present

## 2020-08-12 DIAGNOSIS — Z1389 Encounter for screening for other disorder: Secondary | ICD-10-CM | POA: Diagnosis not present

## 2020-08-12 DIAGNOSIS — I619 Nontraumatic intracerebral hemorrhage, unspecified: Secondary | ICD-10-CM | POA: Diagnosis not present

## 2020-08-12 DIAGNOSIS — E1165 Type 2 diabetes mellitus with hyperglycemia: Secondary | ICD-10-CM | POA: Diagnosis not present

## 2020-08-12 DIAGNOSIS — Z0001 Encounter for general adult medical examination with abnormal findings: Secondary | ICD-10-CM | POA: Diagnosis not present

## 2020-08-12 DIAGNOSIS — Z6829 Body mass index (BMI) 29.0-29.9, adult: Secondary | ICD-10-CM | POA: Diagnosis not present

## 2020-08-12 DIAGNOSIS — E611 Iron deficiency: Secondary | ICD-10-CM | POA: Diagnosis not present

## 2020-08-12 DIAGNOSIS — Z1331 Encounter for screening for depression: Secondary | ICD-10-CM | POA: Diagnosis not present

## 2020-08-12 DIAGNOSIS — I10 Essential (primary) hypertension: Secondary | ICD-10-CM | POA: Diagnosis not present

## 2020-08-12 DIAGNOSIS — E559 Vitamin D deficiency, unspecified: Secondary | ICD-10-CM | POA: Diagnosis not present

## 2020-10-20 DIAGNOSIS — I10 Essential (primary) hypertension: Secondary | ICD-10-CM | POA: Diagnosis not present

## 2020-10-20 DIAGNOSIS — E1165 Type 2 diabetes mellitus with hyperglycemia: Secondary | ICD-10-CM | POA: Diagnosis not present

## 2020-10-20 DIAGNOSIS — E7849 Other hyperlipidemia: Secondary | ICD-10-CM | POA: Diagnosis not present

## 2020-12-02 ENCOUNTER — Other Ambulatory Visit (HOSPITAL_COMMUNITY): Payer: Self-pay | Admitting: Family Medicine

## 2020-12-02 DIAGNOSIS — Z1231 Encounter for screening mammogram for malignant neoplasm of breast: Secondary | ICD-10-CM

## 2020-12-21 DIAGNOSIS — I10 Essential (primary) hypertension: Secondary | ICD-10-CM | POA: Diagnosis not present

## 2020-12-21 DIAGNOSIS — E7849 Other hyperlipidemia: Secondary | ICD-10-CM | POA: Diagnosis not present

## 2020-12-21 DIAGNOSIS — E1165 Type 2 diabetes mellitus with hyperglycemia: Secondary | ICD-10-CM | POA: Diagnosis not present

## 2020-12-27 DIAGNOSIS — D235 Other benign neoplasm of skin of trunk: Secondary | ICD-10-CM | POA: Diagnosis not present

## 2020-12-27 DIAGNOSIS — D485 Neoplasm of uncertain behavior of skin: Secondary | ICD-10-CM | POA: Diagnosis not present

## 2021-01-02 ENCOUNTER — Ambulatory Visit (HOSPITAL_COMMUNITY)
Admission: RE | Admit: 2021-01-02 | Discharge: 2021-01-02 | Disposition: A | Discharge status: Home / Self Care | Payer: Medicare Other | Source: Ambulatory Visit | Attending: Family Medicine | Admitting: Family Medicine

## 2021-01-02 ENCOUNTER — Other Ambulatory Visit: Payer: Self-pay

## 2021-01-02 DIAGNOSIS — Z1231 Encounter for screening mammogram for malignant neoplasm of breast: Secondary | ICD-10-CM | POA: Diagnosis not present

## 2021-02-16 ENCOUNTER — Ambulatory Visit (HOSPITAL_COMMUNITY)
Admission: RE | Admit: 2021-02-16 | Discharge: 2021-02-16 | Disposition: A | Discharge status: Home / Self Care | Payer: Medicare Other | Source: Ambulatory Visit | Attending: Urology | Admitting: Urology

## 2021-02-16 ENCOUNTER — Other Ambulatory Visit: Payer: Self-pay

## 2021-02-16 DIAGNOSIS — N281 Cyst of kidney, acquired: Secondary | ICD-10-CM | POA: Diagnosis not present

## 2021-02-16 DIAGNOSIS — N200 Calculus of kidney: Secondary | ICD-10-CM | POA: Diagnosis not present

## 2021-02-20 DIAGNOSIS — I10 Essential (primary) hypertension: Secondary | ICD-10-CM | POA: Diagnosis not present

## 2021-02-20 DIAGNOSIS — E1165 Type 2 diabetes mellitus with hyperglycemia: Secondary | ICD-10-CM | POA: Diagnosis not present

## 2021-02-20 DIAGNOSIS — E782 Mixed hyperlipidemia: Secondary | ICD-10-CM | POA: Diagnosis not present

## 2021-02-21 DIAGNOSIS — Z23 Encounter for immunization: Secondary | ICD-10-CM | POA: Diagnosis not present

## 2021-02-23 ENCOUNTER — Other Ambulatory Visit: Payer: Self-pay

## 2021-02-23 ENCOUNTER — Encounter: Payer: Self-pay | Admitting: Urology

## 2021-02-23 ENCOUNTER — Ambulatory Visit (INDEPENDENT_AMBULATORY_CARE_PROVIDER_SITE_OTHER): Payer: Medicare Other | Admitting: Urology

## 2021-02-23 VITALS — BP 163/80 | HR 108 | Temp 98.0°F | Wt 174.4 lb

## 2021-02-23 DIAGNOSIS — N281 Cyst of kidney, acquired: Secondary | ICD-10-CM | POA: Diagnosis not present

## 2021-02-23 DIAGNOSIS — N200 Calculus of kidney: Secondary | ICD-10-CM | POA: Diagnosis not present

## 2021-02-23 LAB — URINALYSIS, ROUTINE W REFLEX MICROSCOPIC
Bilirubin, UA: NEGATIVE
Glucose, UA: NEGATIVE
Ketones, UA: NEGATIVE
Nitrite, UA: NEGATIVE
Protein,UA: NEGATIVE
RBC, UA: NEGATIVE
Specific Gravity, UA: 1.01 (ref 1.005–1.030)
Urobilinogen, Ur: 0.2 mg/dL (ref 0.2–1.0)
pH, UA: 5.5 (ref 5.0–7.5)

## 2021-02-23 LAB — MICROSCOPIC EXAMINATION
RBC, Urine: NONE SEEN /hpf (ref 0–2)
Renal Epithel, UA: NONE SEEN /hpf

## 2021-02-23 NOTE — Progress Notes (Signed)
Patient ID: Tamara Thompson, female   DOB: 1951-02-18, 70 y.o.   MRN: [id number]  Subjective: 1. Renal calculus   2. Renal cyst      Tamara Thompson returns today in f/u for her history of renal cysts and stones.  She had a renal US prior to this visit that showed simple renal cysts and non-obstructing stones in both kidneys which are generally stable from last year.  She has had no flank pain or hematuria.  She has no voiding complaints.   ROS:  ROS  Allergies  Allergen Reactions   Penicillins     Childhood allergy Has patient had a PCN reaction causing immediate rash, facial/tongue/throat swelling, SOB or lightheadedness with hypotension: Unknown Has patient had a PCN reaction causing severe rash involving mucus membranes or skin necrosis: Unknown Has patient had a PCN reaction that required hospitalization: Unknown Has patient had a PCN reaction occurring within the last 10 years: No If all of the above answers are "NO", then may proceed with Cephalosporin use.    Statins Other (See Comments)    Broke out in rash all over body - mostly torso   Percocet [Oxycodone-Acetaminophen] Rash    Past Medical History:  Diagnosis Date   Anemia    Bleeding from the nose    Deviated septum    Diabetes mellitus without complication ([place])    History of kidney stones    Kidney stones    Medical history non-contributory    Stroke [facility]) 2019    Past Surgical History:  Procedure Laterality Date   ANKLE SURGERY  2010   left   CESAREAN SECTION     COLONOSCOPY N/A 12/14/2013   Dr. Oneida Alar: 10 colon polyps removed, ascending colon sessile serrated adenoma, hyperplastic polyps. Moderate diverticulosis throughout colon. Left colon redundant   COLONOSCOPY N/A 03/29/2017   Dr. Oneida Alar: multiple serrated adenomas. Surveillance in 3 years    COLONOSCOPY WITH PROPOFOL N/A 05/17/2020   Procedure: COLONOSCOPY WITH PROPOFOL;  Surgeon: Eloise Harman, DO;  [facility];  Service: Endoscopy;   Laterality: N/A;  9:15am   IR ANGIO INTRA EXTRACRAN SEL COM CAROTID INNOMINATE BILAT MOD SED  01/29/2017   IR ANGIO VERTEBRAL SEL VERTEBRAL BILAT MOD SED  01/29/2017   IR RADIOLOGIST EVAL & MGMT  01/17/2017   IR RADIOLOGIST EVAL & MGMT  04/12/2017   NASAL ENDOSCOPY WITH EPISTAXIS CONTROL Bilateral 02/07/2015   Procedure: BILATERAL NASAL ENDOSCOPIC CAUTERIZATION;  Surgeon: Leta Baptist, MD;  Location: [place];  Service: ENT;  Laterality: Bilateral;   NASAL SEPTOPLASTY W/ TURBINOPLASTY Bilateral 02/02/2014   Procedure: RIGHT NASAL SEPTOPLASTY WITH TURBINATE REDUCTION;  Surgeon: Ascencion Dike, MD;  Location: [place];  Service: ENT;  Laterality: Bilateral;   OVARIAN CYST SURGERY     POLYPECTOMY Right 02/02/2014   Procedure: RIGHT POLYPECTOMY NASAL;  Surgeon: Ascencion Dike, MD;  Location: [place];  Service: ENT;  Laterality: Right;   POLYPECTOMY  03/29/2017   Procedure: POLYPECTOMY;  Surgeon: Danie Binder, MD;  [facility];  Service: Endoscopy;;   POLYPECTOMY  05/17/2020   Procedure: POLYPECTOMY;  Surgeon: Eloise Harman, DO;  [facility];  Service: Endoscopy;;   Surgery of jaw and cheek fracture      Social History   Socioeconomic History   Marital status: Married    Spouse name: Not on file   Number of children: Not on file   Years of education: Not  on file   Highest education level: Not on file  Occupational History   Occupation: housewife  Tobacco Use   Smoking status: Former    Packs/day: 1.25    Years: 50.00    Pack years: 62.50    Types: Cigarettes    Quit date: 01/27/2010    Years since quitting: 11.0   Smokeless tobacco: Never  Vaping Use   Vaping Use: Never used  Substance and Sexual Activity   Alcohol use: No   Drug use: No   Sexual activity: Not Currently    Birth control/protection: Post-menopausal  Other Topics Concern   Not on file  Social History Narrative   Not on file   Social  Determinants of Health   Financial Resource Strain: Not on file  Food Insecurity: Not on file  Transportation Needs: Not on file  Physical Activity: Not on file  Stress: Not on file  Social Connections: Not on file  Intimate Partner Violence: Not on file    Family History  Problem Relation Age of Onset   Heart disease Mother    Cancer Father        lung   Stroke Father    Colon cancer Neg Hx     Anti-infectives: Anti-infectives (From admission, onward)    None       Current Outpatient Medications  Medication Sig Dispense Refill   acetaminophen (TYLENOL) 500 MG tablet Take 1,000 mg by mouth daily as needed for moderate pain or headache.     B Complex Vitamins (B COMPLEX PO) Take 0.5 tablets by mouth daily.     Cholecalciferol 2000 units TABS Take 2,000 Units by mouth daily.      Coenzyme Q10 (CO Q 10) 100 MG CAPS Take 100 mg by mouth daily.      ezetimibe (ZETIA) 10 MG tablet Take 10 mg by mouth daily.     ferrous sulfate 325 (65 FE) MG tablet Take 325 mg by mouth 3 (three) times daily.      metFORMIN (GLUCOPHAGE) 500 MG tablet Take 500 mg by mouth 2 (two) times daily.     No current facility-administered medications for this visit.     Objective: Vital signs in last 24 hours: BP (!) 163/80   Pulse (!) 108   Temp 98 F (36.7 C)   Wt 174 lb 6.4 oz (79.1 kg)   BMI 29.02 kg/m   Intake/Output from previous day: No intake/output data recorded. Intake/Output this shift: @IOTHISSHIFT @   Physical Exam  Lab Results:  Results for orders placed or performed in visit on 02/23/21 (from the past 24 hour(s))  Urinalysis, Routine w reflex microscopic     Status: Abnormal   Collection Time: 02/23/21 11:37 AM  Result Value Ref Range   Specific Gravity, UA 1.010 1.005 - 1.030   pH, UA 5.5 5.0 - 7.5   Color, UA Yellow Yellow   Appearance Ur Clear Clear   Leukocytes,UA Trace (A) Negative   Protein,UA Negative Negative/Trace   Glucose, UA Negative Negative   Ketones,  UA Negative Negative   RBC, UA Negative Negative   Bilirubin, UA Negative Negative   Urobilinogen, Ur 0.2 0.2 - 1.0 mg/dL   Nitrite, UA Negative Negative   Microscopic Examination See below:    Narrative   Performed at:  [facility] [address] [place], [place]  [id number] Lab Director: Mina Marble MT, Phone:  [phone]  Microscopic Examination     Status: None   Collection Time:  02/23/21 11:37 AM   Urine  Result Value Ref Range   WBC, UA 0-5 0 - 5 /hpf   RBC None seen 0 - 2 /hpf   Epithelial Cells (non renal) 0-10 0 - 10 /hpf   Renal Epithel, UA None seen None seen /hpf   Bacteria, UA Few None seen/Few   Narrative   Performed at:  [facility] [address] [place], [place]  [id number] Lab Director: [place], Phone:  [phone]    BMET No results for input(s): NA, K, CL, CO2, GLUCOSE, BUN, CREATININE, CALCIUM in the last 72 hours. PT/INR No results for input(s): LABPROT, INR in the last 72 hours. ABG No results for input(s): PHART, HCO3 in the last 72 hours.  Invalid input(s): PCO2, PO2  Studies/Results: No results found. US RENAL  Result Date: 02/18/2021 CLINICAL DATA:  Follow-up renal ultrasound EXAM: RENAL / URINARY TRACT ULTRASOUND COMPLETE COMPARISON:  02/29/2020 FINDINGS: Right Kidney: Renal measurements: 12.2 x 5.4 x 6.1 cm = volume: 207 mL. Echogenicity within normal limits. Nonobstructive calculi. Simple appearing cysts measuring up to 1.9 cm. No mass or hydronephrosis visualized. Left Kidney: Renal measurements: 13.8 x 6.7 x 5.5 cm = volume: 267 mL. Echogenicity within normal limits. Nonobstructive calculi. Simple appearing cysts measuring up to 3.0 cm. No mass or hydronephrosis visualized. Bladder: Appears normal for degree of bladder distention. Other: None. IMPRESSION: 1. Simple appearing bilateral renal cysts. No further follow-up or characterization is required for these benign cysts. 2. Nonobstructive bilateral  renal calculi. No hydronephrosis. Electronically Signed   By: Delanna Ahmadi M.D.   On: 02/18/2021 10:10   MM 3D SCREEN BREAST BILATERAL  Result Date: 01/05/2021 CLINICAL DATA:  Screening. EXAM: DIGITAL SCREENING BILATERAL MAMMOGRAM WITH TOMOSYNTHESIS AND CAD TECHNIQUE: Bilateral screening digital craniocaudal and mediolateral oblique mammograms were obtained. Bilateral screening digital breast tomosynthesis was performed. The images were evaluated with computer-aided detection. COMPARISON:  Previous exam(s). ACR Breast Density Category b: There are scattered areas of fibroglandular density. FINDINGS: There are no findings suspicious for malignancy. IMPRESSION: No mammographic evidence of malignancy. A result letter of this screening mammogram will be mailed directly to the patient. RECOMMENDATION: Screening mammogram in one year. (Code:SM-B-01Y) BI-RADS CATEGORY  1: Negative. Electronically Signed   By: Margarette Canada M.D.   On: 01/05/2021 10:42     Assessment/Plan: Renal cysts and characterized as benign and no further imaging is indicated.  Renal Stones. Stable bilateral non-obstructing stones.  KUB in a year.   No orders of the defined types were placed in this encounter.    Orders Placed This Encounter  Procedures   Microscopic Examination   DG Abd 1 View    Standing Status:   Future    Standing Expiration Date:   02/23/2022    Order Specific Question:   Reason for Exam (SYMPTOM  OR DIAGNOSIS REQUIRED)    Answer:   renal stones    Order Specific Question:   Preferred imaging location?    Answer:   [facility]    Order Specific Question:   Radiology Contrast Protocol - do NOT remove file path    Answer:   [email or web address]   Urinalysis, Routine w reflex microscopic     Return in about 1 year (around 02/23/2022) for with KUB.    CC: Dr. Sharilyn Sites.     Irine Seal 02/23/2021 [phone]

## 2021-02-23 NOTE — Progress Notes (Signed)
Urological Symptom Review  Patient is experiencing the following symptoms: N/a   Review of Systems  Gastrointestinal (upper)  : Negative for upper GI symptoms  Gastrointestinal (lower) : Negative for lower GI symptoms  Constitutional : Negative for symptoms  Skin: Negative for skin symptoms  Eyes: Negative for eye symptoms  Ear/Nose/Throat : Negative for Ear/Nose/Throat symptoms  Hematologic/Lymphatic: Negative for Hematologic/Lymphatic symptoms  Cardiovascular : Negative for cardiovascular symptoms  Respiratory : Negative for respiratory symptoms  Endocrine: Negative for endocrine symptoms  Musculoskeletal: Back pain  Neurological: Negative for neurological symptoms  Psychologic: Negative for psychiatric symptoms

## 2021-06-29 ENCOUNTER — Encounter: Payer: Self-pay | Admitting: *Deleted

## 2021-07-06 ENCOUNTER — Telehealth: Payer: Self-pay | Admitting: Internal Medicine

## 2021-07-06 NOTE — Telephone Encounter (Signed)
error 

## 2021-07-06 NOTE — Telephone Encounter (Signed)
Letter mailed

## 2021-07-06 NOTE — Telephone Encounter (Signed)
Recall for ultrasound 

## 2021-07-17 ENCOUNTER — Telehealth: Payer: Self-pay | Admitting: Internal Medicine

## 2021-07-17 DIAGNOSIS — K7581 Nonalcoholic steatohepatitis (NASH): Secondary | ICD-10-CM

## 2021-07-17 DIAGNOSIS — R7989 Other specified abnormal findings of blood chemistry: Secondary | ICD-10-CM

## 2021-07-17 NOTE — Telephone Encounter (Signed)
Patient received letter to schedule her ultrasound  ?

## 2021-07-17 NOTE — Telephone Encounter (Signed)
RUQ [place] scheduled for 3/31 arrival 10:15am, npo midnight. ? ? ?Pt aware of appt details.  ?

## 2021-07-17 NOTE — Addendum Note (Signed)
Addended by: Cheron Every on: 07/17/2021 09:41 AM ? ? Modules accepted: Orders ? ?

## 2021-07-21 ENCOUNTER — Ambulatory Visit (HOSPITAL_COMMUNITY)
Admission: RE | Admit: 2021-07-21 | Discharge: 2021-07-21 | Disposition: A | Discharge status: Home / Self Care | Payer: Medicare Other | Source: Ambulatory Visit | Attending: Gastroenterology | Admitting: Gastroenterology

## 2021-07-21 DIAGNOSIS — R7989 Other specified abnormal findings of blood chemistry: Secondary | ICD-10-CM | POA: Diagnosis not present

## 2021-07-21 DIAGNOSIS — K7581 Nonalcoholic steatohepatitis (NASH): Secondary | ICD-10-CM | POA: Diagnosis not present

## 2021-07-21 DIAGNOSIS — R945 Abnormal results of liver function studies: Secondary | ICD-10-CM | POA: Diagnosis not present

## 2021-07-31 ENCOUNTER — Other Ambulatory Visit: Payer: Self-pay

## 2021-07-31 DIAGNOSIS — K7581 Nonalcoholic steatohepatitis (NASH): Secondary | ICD-10-CM

## 2021-08-02 DIAGNOSIS — K7581 Nonalcoholic steatohepatitis (NASH): Secondary | ICD-10-CM | POA: Diagnosis not present

## 2021-08-03 LAB — HEPATIC FUNCTION PANEL
AG Ratio: 1.1 (calc) (ref 1.0–2.5)
ALT: 52 U/L — ABNORMAL HIGH (ref 6–29)
AST: 31 U/L (ref 10–35)
Albumin: 4 g/dL (ref 3.6–5.1)
Alkaline phosphatase (APISO): 49 U/L (ref 37–153)
Bilirubin, Direct: 0.1 mg/dL (ref 0.0–0.2)
Globulin: 3.7 g/dL (calc) (ref 1.9–3.7)
Indirect Bilirubin: 0.2 mg/dL (calc) (ref 0.2–1.2)
Total Bilirubin: 0.3 mg/dL (ref 0.2–1.2)
Total Protein: 7.7 g/dL (ref 6.1–8.1)

## 2021-08-08 ENCOUNTER — Telehealth: Payer: Self-pay | Admitting: Internal Medicine

## 2021-08-08 NOTE — Telephone Encounter (Signed)
Patient said someone called her yesterday  ?

## 2021-08-08 NOTE — Telephone Encounter (Signed)
See result note.  

## 2021-08-29 ENCOUNTER — Encounter: Payer: Self-pay | Admitting: Gastroenterology

## 2021-08-29 ENCOUNTER — Ambulatory Visit (INDEPENDENT_AMBULATORY_CARE_PROVIDER_SITE_OTHER): Payer: Medicare Other | Admitting: Gastroenterology

## 2021-08-29 ENCOUNTER — Other Ambulatory Visit: Payer: Self-pay

## 2021-08-29 VITALS — BP 138/90 | HR 84 | Temp 97.5°F | Ht 65.0 in | Wt 172.6 lb

## 2021-08-29 DIAGNOSIS — K7581 Nonalcoholic steatohepatitis (NASH): Secondary | ICD-10-CM

## 2021-08-29 DIAGNOSIS — R7989 Other specified abnormal findings of blood chemistry: Secondary | ICD-10-CM | POA: Diagnosis not present

## 2021-08-29 NOTE — Progress Notes (Signed)
? ? ? ?GI Office Note   ? ?Referring Provider: Sharilyn Sites, MD ?Primary Care Physician:  Sharilyn Sites, MD  ?Primary Gastroenterologist: Elon Alas. Abbey Chatters, DO ? ? ?Chief Complaint  ? ?Chief Complaint  ?Patient presents with  ? Follow-up  ?  No current issues to discuss.   ? ? ?History of Present Illness  ? ?Tamara Thompson is a 71 y.o. female presenting today for follow up. Last seen in 03/2020. She has history of adenomatous colon polyps and biopsy-proven steatohepatitis and elevated LFTs.  Thorough evaluation to include serologies showed positive ASMA and ANA.  Liver biopsy (2018) with mild steatohepatitis.  Viral markers previously negative. She has been getting yearly u/s since elastography showed F3/F4 due to risk of progression.  ? ?Doing well. No complaints. No abdominal pain. Appetite normal. No heartburn, vomiting. BM regular. No melena, brbpr. No unintentional weight loss. Sees PCP tomorrow.  ? ?Labs from April 2023: Total bilirubin 0.3, alkaline phosphatase 49, AST 39, ALT 52.  Transaminases from January 2022, AST 41, ALT 70.  Back in 2020 her AST was as high as 86, ALT as high as 150. ? ?Right upper quadrant ultrasound March 2023: ?IMPRESSION: ?1. The common bile duct measures 6.8 mm. 6 mm is the upper limits of ?normal. Recommend correlation with LFTs. If there is concern for ?biliary obstruction, recommend MRCP or ERCP. ?2. Increased echogenicity in the liver is nonspecific but often seen ?with hepatic steatosis. ? ?Colonoscopy January 2022: ?- Preparation of the colon was fair. ?- Non-bleeding internal hemorrhoids. ?- Diverticulosis in the sigmoid colon and in the descending colon. ?- Five 4 to 8 mm polyps in the sigmoid colon, in the descending colon and in the transverse ?colon, removed with a cold snare. Resected and retrieved. ?- The examination was otherwise normal. ?-polyps-sessile serrated polyp ?-Colonoscopy in 5 years. ? ?Medications  ? ?Current Outpatient Medications  ?Medication Sig  Dispense Refill  ? acetaminophen (TYLENOL) 500 MG tablet Take 1,000 mg by mouth daily as needed for moderate pain or headache.    ? B Complex Vitamins (B COMPLEX PO) Take 0.5 tablets by mouth daily.    ? Cholecalciferol 2000 units TABS Take 2,000 Units by mouth daily.     ? Coenzyme Q10 (CO Q 10) 100 MG CAPS Take 100 mg by mouth daily.     ? ezetimibe (ZETIA) 10 MG tablet Take 10 mg by mouth daily.    ? ferrous sulfate 325 (65 FE) MG tablet Take 325 mg by mouth 3 (three) times daily.     ? metFORMIN (GLUCOPHAGE) 500 MG tablet Take 500 mg by mouth 2 (two) times daily.    ? ?No current facility-administered medications for this visit.  ? ? ?Allergies  ? ?Allergies as of 08/29/2021 - Review Complete 08/29/2021  ?Allergen Reaction Noted  ? Penicillins  02/28/2012  ? Statins Other (See Comments) 01/22/2017  ? Percocet [oxycodone-acetaminophen] Rash 01/28/2015  ? ?    ? ?Review of Systems  ? ?General: Negative for anorexia, weight loss, fever, chills, fatigue, weakness. ?ENT: Negative for hoarseness, difficulty swallowing , nasal congestion. ?CV: Negative for chest pain, angina, palpitations, dyspnea on exertion, peripheral edema.  ?Respiratory: Negative for dyspnea at rest, dyspnea on exertion, cough, sputum, wheezing.  ?GI: See history of present illness. ?GU:  Negative for dysuria, hematuria, urinary incontinence, urinary frequency, nocturnal urination.  ?Endo: Negative for unusual weight change.  ?   ?Physical Exam  ? ?BP 138/90 (BP Location: Right Arm, Patient Position: Sitting, Cuff  Size: Normal)   Pulse 84   Temp (!) 97.5 ?F (36.4 ?C) (Temporal)   Ht 5' 5"  (1.651 m)   Wt 172 lb 9.6 oz (78.3 kg)   SpO2 98%   BMI 28.72 kg/m?  ?  ?General: Well-nourished, well-developed in no acute distress.  ?Eyes: No icterus. ?Mouth: Oropharyngeal mucosa moist and pink , no lesions erythema or exudate. ?Lungs: Clear to auscultation bilaterally.  ?Heart: Regular rate and rhythm, no murmurs rubs or gallops.  ?Abdomen: Bowel  sounds are normal, nontender, nondistended, no hepatosplenomegaly or masses,  ?no abdominal bruits or hernia , no rebound or guarding.  ?Rectal: not performed  ?Extremities: No lower extremity edema. No clubbing or deformities. ?Neuro: Alert and oriented x 4   ?Skin: Warm and dry, no jaundice.   ?Psych: Alert and cooperative, normal mood and affect. ? ?Labs  ?  ? ?Lab Results  ?Component Value Date  ? ALT 52 (H) 08/02/2021  ? AST 31 08/02/2021  ? ALKPHOS 56 10/26/2016  ? BILITOT 0.3 08/02/2021  ? ? ?Imaging Studies  ? ?No results found. ? ?Assessment  ? ?Steatohepatitis: Biopsy 2018 showed mild steatohepatitis (NAS score of 4/5). No features of autoimmune hepatitis or chronic cholestatic process. In 2020, Elastography with F3/F4. No definite liver surface irregularity.  Spleen normal in size.  She has been getting serial yearly ultrasounds due to risk of progression to cirrhosis.  Last ultrasound March 2023 with CBD 6.8 mm, findings consistent with hepatic steatosis.  Labs from April 2023 with normal LFTs except for ALT of 52, improved from before.  Clinically doing well. ?  ?History of adenomatous colon polyps: Last colonoscopy January 2022, she had 5 polyps removed, pathology revealed sessile serrated polyps.  In 2018 she had 7 adenomas removed.  In 2015 she had a serrated adenoma removed.  Is somewhat concerned about waiting 5 years for her next colonoscopy as she had been a every 3-year plan.  Would not be unreasonable to pursue 3-year surveillance colonoscopy given last colonoscopy findings of 5 polyps, path showing sessile serrated polyps. ?  ? ?PLAN  ? ?LFTs in one year.  ?Consider next abdominal u/s with elastography in 2 years unless LFTs increase. ?Continue to be active, eat healthy.  ?Consider colonoscopy in 3 years, based on current guidelines. Would go no more than 5 years.  ?Return ov in 07/2022.   ? ? ?Laureen Ochs. Darcey Demma, MHS, PA-C ?[facility] ? ?

## 2021-08-29 NOTE — Patient Instructions (Addendum)
We will recheck your labs in April 2024. ?Unless liver labs change, would consider going two years between liver ultrasounds for now. ?Dr. Abbey Chatters mentioned next colonoscopy in 5 years from the last one. Guidelines changed periodically so we can address again after your next visit. You should not go more than 5 years but would consider another colonoscopy 04/2023.  ?Office visit in April 2024.  ?Call with any questions or concerns. ?

## 2021-08-30 DIAGNOSIS — E611 Iron deficiency: Secondary | ICD-10-CM | POA: Diagnosis not present

## 2021-08-30 DIAGNOSIS — E782 Mixed hyperlipidemia: Secondary | ICD-10-CM | POA: Diagnosis not present

## 2021-08-30 DIAGNOSIS — I10 Essential (primary) hypertension: Secondary | ICD-10-CM | POA: Diagnosis not present

## 2021-08-30 DIAGNOSIS — E1165 Type 2 diabetes mellitus with hyperglycemia: Secondary | ICD-10-CM | POA: Diagnosis not present

## 2021-08-30 DIAGNOSIS — E7849 Other hyperlipidemia: Secondary | ICD-10-CM | POA: Diagnosis not present

## 2021-08-30 DIAGNOSIS — I619 Nontraumatic intracerebral hemorrhage, unspecified: Secondary | ICD-10-CM | POA: Diagnosis not present

## 2021-08-30 DIAGNOSIS — Z1331 Encounter for screening for depression: Secondary | ICD-10-CM | POA: Diagnosis not present

## 2021-08-30 DIAGNOSIS — Z0000 Encounter for general adult medical examination without abnormal findings: Secondary | ICD-10-CM | POA: Diagnosis not present

## 2021-08-30 DIAGNOSIS — Z6829 Body mass index (BMI) 29.0-29.9, adult: Secondary | ICD-10-CM | POA: Diagnosis not present

## 2021-08-30 DIAGNOSIS — E559 Vitamin D deficiency, unspecified: Secondary | ICD-10-CM | POA: Diagnosis not present

## 2021-08-30 DIAGNOSIS — E663 Overweight: Secondary | ICD-10-CM | POA: Diagnosis not present

## 2021-09-20 ENCOUNTER — Telehealth: Payer: Self-pay | Admitting: Gastroenterology

## 2021-09-20 NOTE — Telephone Encounter (Signed)
Discussed with Dr. Abbey Chatters after last OV.   Please change NIC to colonoscopy 04/2023 (which would be 3 year follow up from last one instead of 5).

## 2021-10-26 ENCOUNTER — Other Ambulatory Visit (HOSPITAL_COMMUNITY): Payer: Self-pay | Admitting: Family Medicine

## 2021-10-26 DIAGNOSIS — E2839 Other primary ovarian failure: Secondary | ICD-10-CM

## 2021-11-14 ENCOUNTER — Ambulatory Visit (HOSPITAL_COMMUNITY)
Admission: RE | Admit: 2021-11-14 | Discharge: 2021-11-14 | Disposition: A | Discharge status: Home / Self Care | Payer: Medicare Other | Source: Ambulatory Visit | Attending: Family Medicine | Admitting: Family Medicine

## 2021-11-14 DIAGNOSIS — E2839 Other primary ovarian failure: Secondary | ICD-10-CM | POA: Diagnosis not present

## 2021-11-14 DIAGNOSIS — Z780 Asymptomatic menopausal state: Secondary | ICD-10-CM | POA: Diagnosis not present

## 2021-11-14 DIAGNOSIS — M85851 Other specified disorders of bone density and structure, right thigh: Secondary | ICD-10-CM | POA: Diagnosis not present

## 2021-11-29 ENCOUNTER — Other Ambulatory Visit (HOSPITAL_COMMUNITY): Payer: Self-pay | Admitting: Family Medicine

## 2021-11-29 DIAGNOSIS — Z1231 Encounter for screening mammogram for malignant neoplasm of breast: Secondary | ICD-10-CM

## 2022-01-10 ENCOUNTER — Ambulatory Visit (HOSPITAL_COMMUNITY)
Admission: RE | Admit: 2022-01-10 | Discharge: 2022-01-10 | Disposition: A | Discharge status: Home / Self Care | Payer: Medicare Other | Source: Ambulatory Visit | Attending: Family Medicine | Admitting: Family Medicine

## 2022-01-10 DIAGNOSIS — Z1231 Encounter for screening mammogram for malignant neoplasm of breast: Secondary | ICD-10-CM | POA: Diagnosis not present

## 2022-02-19 ENCOUNTER — Ambulatory Visit (HOSPITAL_COMMUNITY)
Admission: RE | Admit: 2022-02-19 | Discharge: 2022-02-19 | Disposition: A | Discharge status: Home / Self Care | Payer: Medicare Other | Source: Ambulatory Visit | Attending: Urology | Admitting: Urology

## 2022-02-19 DIAGNOSIS — N200 Calculus of kidney: Secondary | ICD-10-CM | POA: Insufficient documentation

## 2022-02-22 ENCOUNTER — Ambulatory Visit (INDEPENDENT_AMBULATORY_CARE_PROVIDER_SITE_OTHER): Payer: Medicare Other | Admitting: Urology

## 2022-02-22 ENCOUNTER — Encounter: Payer: Self-pay | Admitting: Urology

## 2022-02-22 VITALS — BP 170/82 | HR 112

## 2022-02-22 DIAGNOSIS — N200 Calculus of kidney: Secondary | ICD-10-CM

## 2022-02-22 DIAGNOSIS — Z87442 Personal history of urinary calculi: Secondary | ICD-10-CM

## 2022-02-22 LAB — MICROSCOPIC EXAMINATION
Bacteria, UA: NONE SEEN
Epithelial Cells (non renal): NONE SEEN /hpf (ref 0–10)
RBC, Urine: NONE SEEN /hpf (ref 0–2)

## 2022-02-22 LAB — URINALYSIS, ROUTINE W REFLEX MICROSCOPIC
Bilirubin, UA: NEGATIVE
Glucose, UA: NEGATIVE
Ketones, UA: NEGATIVE
Nitrite, UA: NEGATIVE
Protein,UA: NEGATIVE
RBC, UA: NEGATIVE
Specific Gravity, UA: <1.005 — ABNORMAL LOW (ref 1.005–1.030)
Urobilinogen, Ur: 0.2 mg/dL (ref 0.2–1.0)
pH, UA: 5.5 (ref 5.0–7.5)

## 2022-02-22 NOTE — Progress Notes (Signed)
Patient ID: Tamara Thompson, female   DOB: 06-24-1950, 71 y.o.   MRN: [id number]  Subjective: 1. Renal calculus      Tamara Thompson returns today in f/u for her history of renal stones.  She had a KUB prior to this visit that shows small left renal stones.  No right renal stones were noted.  She has had no flank pain or hematuria.  She has no voiding complaints.  UA is clear.   ROS:  Review of Systems  All other systems reviewed and are negative.   Allergies  Allergen Reactions   Penicillins     Childhood allergy Has patient had a PCN reaction causing immediate rash, facial/tongue/throat swelling, SOB or lightheadedness with hypotension: Unknown Has patient had a PCN reaction causing severe rash involving mucus membranes or skin necrosis: Unknown Has patient had a PCN reaction that required hospitalization: Unknown Has patient had a PCN reaction occurring within the last 10 years: No If all of the above answers are "NO", then may proceed with Cephalosporin use.    Statins Other (See Comments)    Broke out in rash all over body - mostly torso   Percocet [Oxycodone-Acetaminophen] Rash    Past Medical History:  Diagnosis Date   Anemia    Bleeding from the nose    Deviated septum    Diabetes mellitus without complication ([place])    History of kidney stones    Kidney stones    Medical history non-contributory    Stroke [facility]) 2019    Past Surgical History:  Procedure Laterality Date   ANKLE SURGERY  2010   left   CESAREAN SECTION     COLONOSCOPY N/A 12/14/2013   Dr. Oneida Alar: 10 colon polyps removed, ascending colon sessile serrated adenoma, hyperplastic polyps. Moderate diverticulosis throughout colon. Left colon redundant   COLONOSCOPY N/A 03/29/2017   Dr. Oneida Alar: multiple serrated adenomas. Surveillance in 3 years    COLONOSCOPY WITH PROPOFOL N/A 05/17/2020   Procedure: COLONOSCOPY WITH PROPOFOL;  Surgeon: Eloise Harman, DO;  [facility];  Service: Endoscopy;   Laterality: N/A;  9:15am   IR ANGIO INTRA EXTRACRAN SEL COM CAROTID INNOMINATE BILAT MOD SED  01/29/2017   IR ANGIO VERTEBRAL SEL VERTEBRAL BILAT MOD SED  01/29/2017   IR RADIOLOGIST EVAL & MGMT  01/17/2017   IR RADIOLOGIST EVAL & MGMT  04/12/2017   NASAL ENDOSCOPY WITH EPISTAXIS CONTROL Bilateral 02/07/2015   Procedure: BILATERAL NASAL ENDOSCOPIC CAUTERIZATION;  Surgeon: Leta Baptist, MD;  Location: [place];  Service: ENT;  Laterality: Bilateral;   NASAL SEPTOPLASTY W/ TURBINOPLASTY Bilateral 02/02/2014   Procedure: RIGHT NASAL SEPTOPLASTY WITH TURBINATE REDUCTION;  Surgeon: Ascencion Dike, MD;  Location: [place];  Service: ENT;  Laterality: Bilateral;   OVARIAN CYST SURGERY     POLYPECTOMY Right 02/02/2014   Procedure: RIGHT POLYPECTOMY NASAL;  Surgeon: Ascencion Dike, MD;  Location: [place];  Service: ENT;  Laterality: Right;   POLYPECTOMY  03/29/2017   Procedure: POLYPECTOMY;  Surgeon: Danie Binder, MD;  [facility];  Service: Endoscopy;;   POLYPECTOMY  05/17/2020   Procedure: POLYPECTOMY;  Surgeon: Eloise Harman, DO;  [facility];  Service: Endoscopy;;   Surgery of jaw and cheek fracture      Social History   Socioeconomic History   Marital status: Married    Spouse name: Not on file   Number of children: Not on file   Years of  education: Not on file   Highest education level: Not on file  Occupational History   Occupation: housewife  Tobacco Use   Smoking status: Former    Packs/day: 1.25    Years: 50.00    Total pack years: 62.50    Types: Cigarettes    Quit date: 01/27/2010    Years since quitting: 12.0   Smokeless tobacco: Never  Vaping Use   Vaping Use: Never used  Substance and Sexual Activity   Alcohol use: No   Drug use: No   Sexual activity: Not Currently    Birth control/protection: Post-menopausal  Other Topics Concern   Not on file  Social History Narrative   Not on file   Social  Determinants of Health   Financial Resource Strain: Not on file  Food Insecurity: Not on file  Transportation Needs: Not on file  Physical Activity: Not on file  Stress: Not on file  Social Connections: Not on file  Intimate Partner Violence: Not on file    Family History  Problem Relation Age of Onset   Heart disease Mother    Cancer Father        lung   Stroke Father    Colon cancer Neg Hx     Anti-infectives: Anti-infectives (From admission, onward)    None       Current Outpatient Medications  Medication Sig Dispense Refill   acetaminophen (TYLENOL) 500 MG tablet Take 1,000 mg by mouth daily as needed for moderate pain or headache.     B Complex Vitamins (B COMPLEX PO) Take 0.5 tablets by mouth daily.     Cholecalciferol 2000 units TABS Take 2,000 Units by mouth daily.      Coenzyme Q10 (CO Q 10) 100 MG CAPS Take 100 mg by mouth daily.      ezetimibe (ZETIA) 10 MG tablet Take 10 mg by mouth daily.     ferrous sulfate 325 (65 FE) MG tablet Take 325 mg by mouth 3 (three) times daily.      metFORMIN (GLUCOPHAGE) 500 MG tablet Take 500 mg by mouth 2 (two) times daily.     No current facility-administered medications for this visit.     Objective: Vital signs in last 24 hours: BP (!) 170/82   Pulse (!) 112   Intake/Output from previous day: No intake/output data recorded. Intake/Output this shift: @IOTHISSHIFT @   Physical Exam Vitals reviewed.  Constitutional:      Appearance: Normal appearance.  Neurological:     Mental Status: She is alert.     Lab Results:  Results for orders placed or performed in visit on 02/22/22 (from the past 24 hour(s))  Urinalysis, Routine w reflex microscopic     Status: Abnormal   Collection Time: 02/22/22  9:54 AM  Result Value Ref Range   Specific Gravity, UA <1.005 (L) 1.005 - 1.030   pH, UA 5.5 5.0 - 7.5   Color, UA Yellow Yellow   Appearance Ur Clear Clear   Leukocytes,UA Trace (A) Negative   Protein,UA Negative  Negative/Trace   Glucose, UA Negative Negative   Ketones, UA Negative Negative   RBC, UA Negative Negative   Bilirubin, UA Negative Negative   Urobilinogen, Ur 0.2 0.2 - 1.0 mg/dL   Nitrite, UA Negative Negative   Microscopic Examination See below:    Narrative   Performed at:  [facility] [address], [place], [place]  [id number] Lab Director: [place], Phone:  [phone]  Microscopic Examination  Status: None   Collection Time: 02/22/22  9:54 AM   Urine  Result Value Ref Range   WBC, UA 0-5 0 - 5 /hpf   RBC, Urine None seen 0 - 2 /hpf   Epithelial Cells (non renal) None seen 0 - 10 /hpf   Bacteria, UA None seen None seen/Few   Narrative   Performed at:  [facility] [address] [place], [place]  [id number] Lab Director: [place], Phone:  [phone]    UA is clear.  BMET No results for input(s): "NA", "K", "CL", "CO2", "GLUCOSE", "BUN", "CREATININE", "CALCIUM" in the last 72 hours. PT/INR No results for input(s): "LABPROT", "INR" in the last 72 hours. ABG No results for input(s): "PHART", "HCO3" in the last 72 hours.  Invalid input(s): "PCO2", "PO2"  Studies/Results: No results found. DG Abd 1 View  Result Date: 02/19/2022 CLINICAL DATA:  Renal calculi EXAM: ABDOMEN - 1 VIEW COMPARISON:  03/30/2019 FINDINGS: 7 mm calculus projects over mid kidney unchanged. Faint 5 mm calculus projects over inferior pole LEFT kidney. Probable calculus 6 mm diameter projects over upper pole LEFT kidney. No RIGHT renal calculi identified. No ureteral calcifications. Bowel gas pattern normal. Bones demineralized. IMPRESSION: Multiple suspected LEFT renal calculi. Electronically Signed   By: Lavonia Dana M.D.   On: 02/19/2022 17:39   MM 3D SCREEN BREAST BILATERAL  Result Date: 01/11/2022 CLINICAL DATA:  Screening. EXAM: DIGITAL SCREENING BILATERAL MAMMOGRAM WITH TOMOSYNTHESIS AND CAD TECHNIQUE: Bilateral screening digital craniocaudal  and mediolateral oblique mammograms were obtained. Bilateral screening digital breast tomosynthesis was performed. The images were evaluated with computer-aided detection. COMPARISON:  Previous exam(s). ACR Breast Density Category a: The breast tissue is almost entirely fatty. FINDINGS: There are no findings suspicious for malignancy. IMPRESSION: No mammographic evidence of malignancy. A result letter of this screening mammogram will be mailed directly to the patient. RECOMMENDATION: Screening mammogram in one year. (Code:SM-B-01Y) BI-RADS CATEGORY  1: Negative. Electronically Signed   By: Franki Cabot M.D.   On: 01/11/2022 09:46     Assessment/Plan:  Renal Stones. KUB only shows left renal stones.   No orders of the defined types were placed in this encounter.    Orders Placed This Encounter  Procedures   Microscopic Examination   DG Abd 1 View    Standing Status:   Future    Standing Expiration Date:   02/23/2023    Order Specific Question:   Reason for Exam (SYMPTOM  OR DIAGNOSIS REQUIRED)    Answer:   renal stone    Order Specific Question:   Preferred imaging location?    Answer:   [facility]    Order Specific Question:   Radiology Contrast Protocol - do NOT remove file path    Answer:   [email or web address]   Urinalysis, Routine w reflex microscopic     Return in about 1 year (around 02/23/2023) for with KUB.    CC: Dr. Sharilyn Sites.     Irine Seal 02/23/2022 [phone]

## 2022-02-26 DIAGNOSIS — Z23 Encounter for immunization: Secondary | ICD-10-CM | POA: Diagnosis not present

## 2022-07-16 ENCOUNTER — Other Ambulatory Visit: Payer: Self-pay

## 2022-07-16 DIAGNOSIS — R7989 Other specified abnormal findings of blood chemistry: Secondary | ICD-10-CM

## 2022-07-23 DIAGNOSIS — R7989 Other specified abnormal findings of blood chemistry: Secondary | ICD-10-CM | POA: Diagnosis not present

## 2022-07-24 LAB — HEPATIC FUNCTION PANEL
AG Ratio: 1 (calc) (ref 1.0–2.5)
ALT: 22 U/L (ref 6–29)
AST: 23 U/L (ref 10–35)
Albumin: 4 g/dL (ref 3.6–5.1)
Alkaline phosphatase (APISO): 59 U/L (ref 37–153)
Bilirubin, Direct: 0.1 mg/dL (ref 0.0–0.2)
Globulin: 4.2 g/dL (calc) — ABNORMAL HIGH (ref 1.9–3.7)
Indirect Bilirubin: 0.2 mg/dL (calc) (ref 0.2–1.2)
Total Bilirubin: 0.3 mg/dL (ref 0.2–1.2)
Total Protein: 8.2 g/dL — ABNORMAL HIGH (ref 6.1–8.1)

## 2022-07-24 LAB — HEPATITIS B SURFACE ANTIBODY,QUALITATIVE: Hep B S Ab: NONREACTIVE

## 2022-07-24 LAB — HEPATITIS A ANTIBODY, TOTAL: Hepatitis A AB,Total: REACTIVE — AB

## 2022-08-28 ENCOUNTER — Telehealth: Payer: Self-pay

## 2022-08-28 NOTE — Telephone Encounter (Signed)
Pt called stating that she had the hep A and hep B series on 05/08/2019.

## 2022-08-29 NOTE — Telephone Encounter (Signed)
Ok. She did not mount immunity to hep B. She could repeat vaccine if desires.

## 2022-08-30 ENCOUNTER — Ambulatory Visit: Payer: Medicare Other | Admitting: Internal Medicine

## 2022-08-30 NOTE — Telephone Encounter (Signed)
Pt's daughter Delaney Meigs (dpr on file) was made aware and verbalized understanding.

## 2022-09-19 DIAGNOSIS — Z6825 Body mass index (BMI) 25.0-25.9, adult: Secondary | ICD-10-CM | POA: Diagnosis not present

## 2022-09-19 DIAGNOSIS — E1165 Type 2 diabetes mellitus with hyperglycemia: Secondary | ICD-10-CM | POA: Diagnosis not present

## 2022-09-19 DIAGNOSIS — E611 Iron deficiency: Secondary | ICD-10-CM | POA: Diagnosis not present

## 2022-09-19 DIAGNOSIS — I619 Nontraumatic intracerebral hemorrhage, unspecified: Secondary | ICD-10-CM | POA: Diagnosis not present

## 2022-09-19 DIAGNOSIS — I10 Essential (primary) hypertension: Secondary | ICD-10-CM | POA: Diagnosis not present

## 2022-09-19 DIAGNOSIS — E782 Mixed hyperlipidemia: Secondary | ICD-10-CM | POA: Diagnosis not present

## 2022-09-19 DIAGNOSIS — Z0000 Encounter for general adult medical examination without abnormal findings: Secondary | ICD-10-CM | POA: Diagnosis not present

## 2022-09-19 DIAGNOSIS — E559 Vitamin D deficiency, unspecified: Secondary | ICD-10-CM | POA: Diagnosis not present

## 2022-09-19 DIAGNOSIS — E663 Overweight: Secondary | ICD-10-CM | POA: Diagnosis not present

## 2022-09-19 DIAGNOSIS — Z1331 Encounter for screening for depression: Secondary | ICD-10-CM | POA: Diagnosis not present

## 2022-11-01 IMAGING — MG MM DIGITAL SCREENING BILAT W/ TOMO AND CAD
6 of 12 series · 6 of 36 positions shown · non-contrast
Comparison: Previous exam(s).

CLINICAL DATA: Screening.

EXAM:
DIGITAL SCREENING BILATERAL MAMMOGRAM WITH TOMOSYNTHESIS AND CAD
TECHNIQUE: Bilateral screening digital craniocaudal and mediolateral oblique
mammograms were obtained. Bilateral screening digital breast
tomosynthesis was performed. The images were evaluated with
computer-aided detection.

[R CC synth-2D]
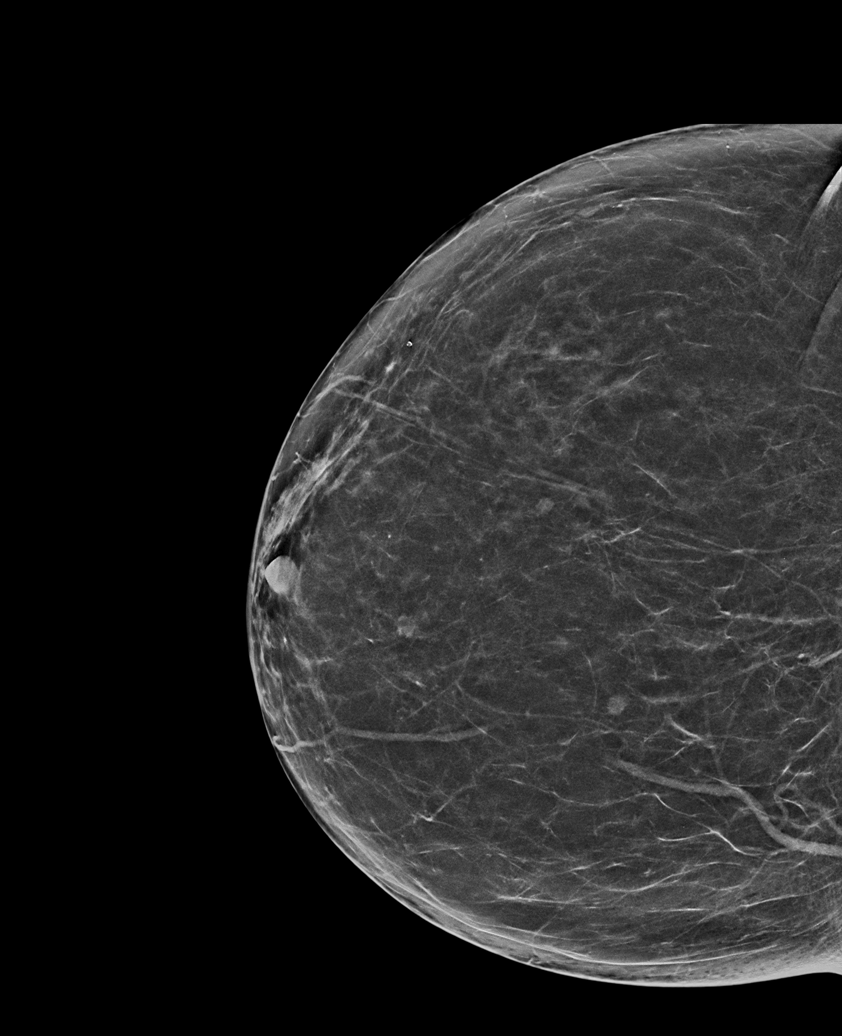

[R MLO synth-2D (1 of 2)]
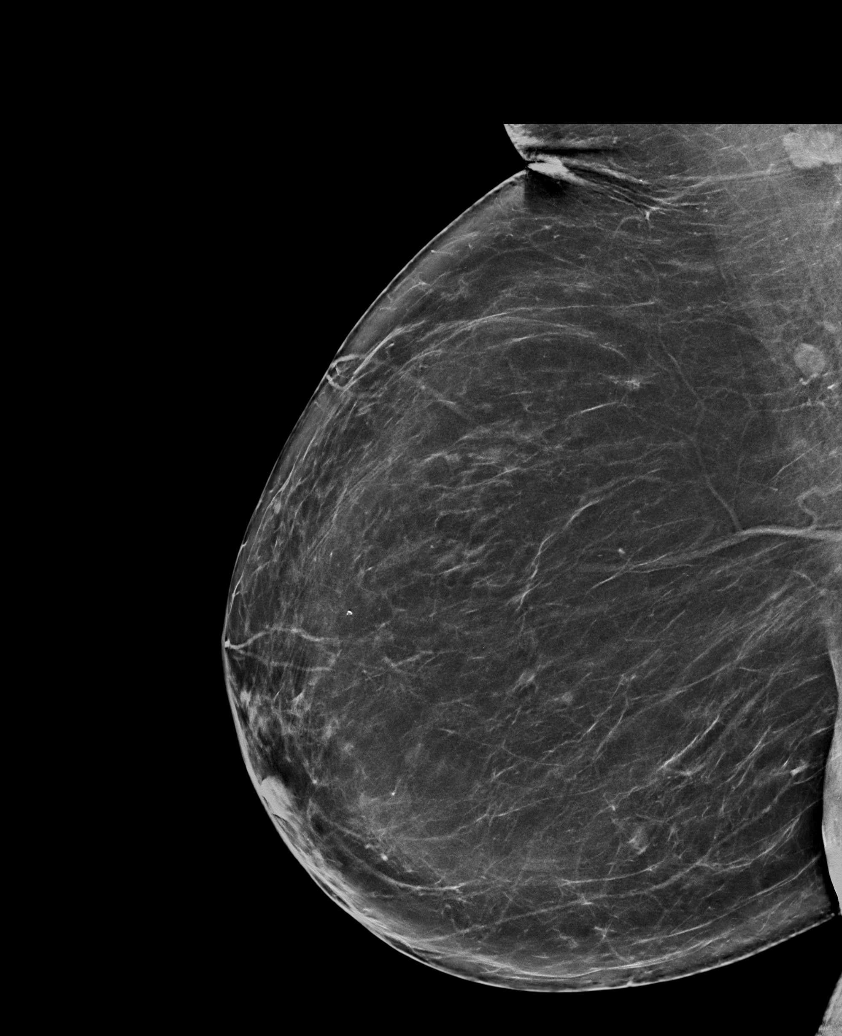

[R MLO synth-2D (2 of 2)]
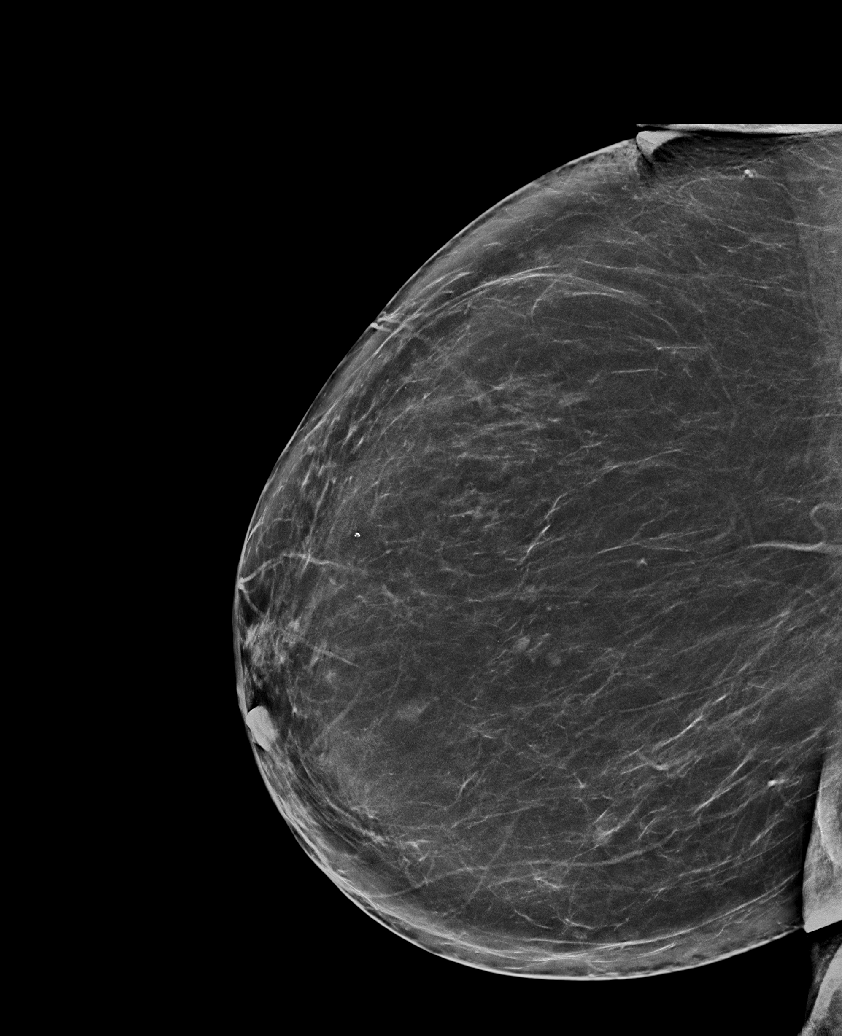

[L MLO synth-2D (1 of 2)]
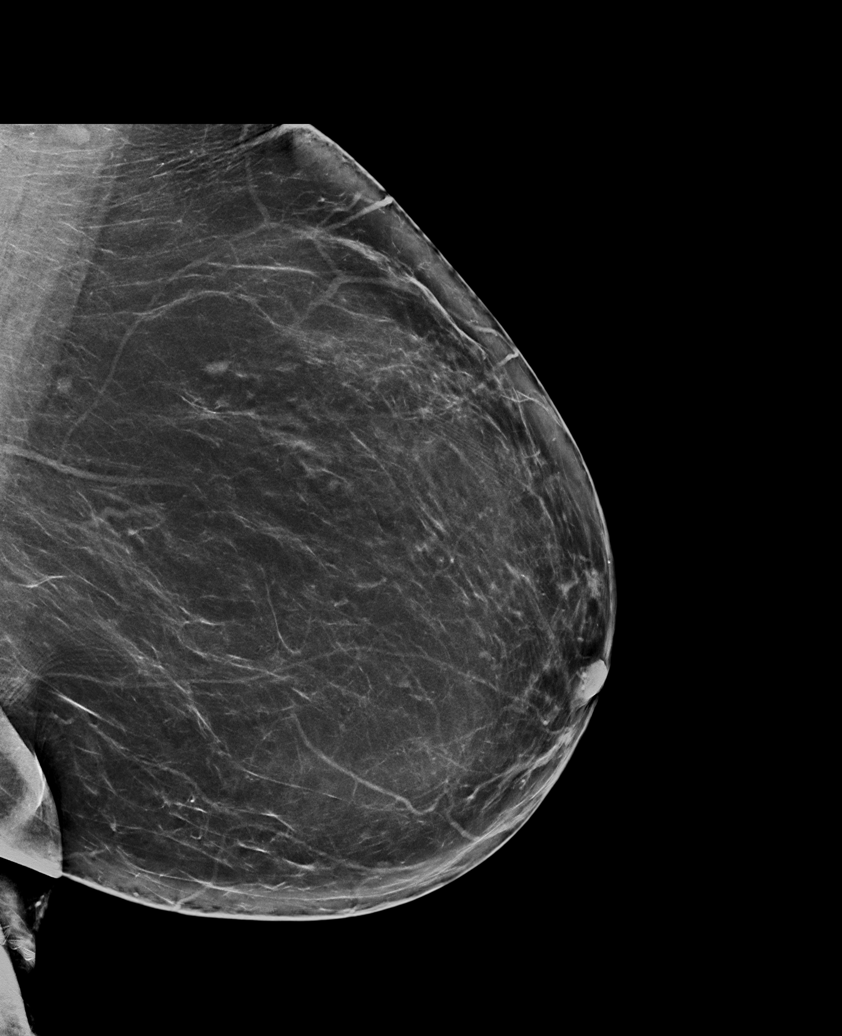

[L CC synth-2D]
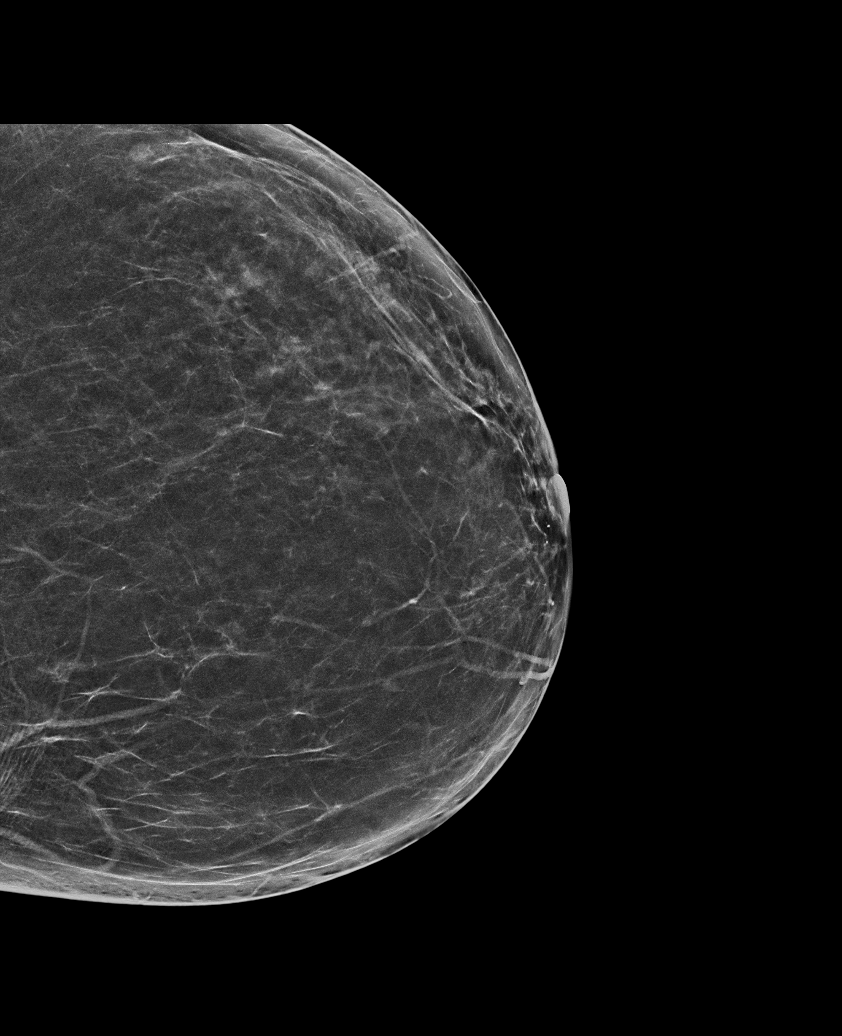

[L MLO synth-2D (2 of 2)]
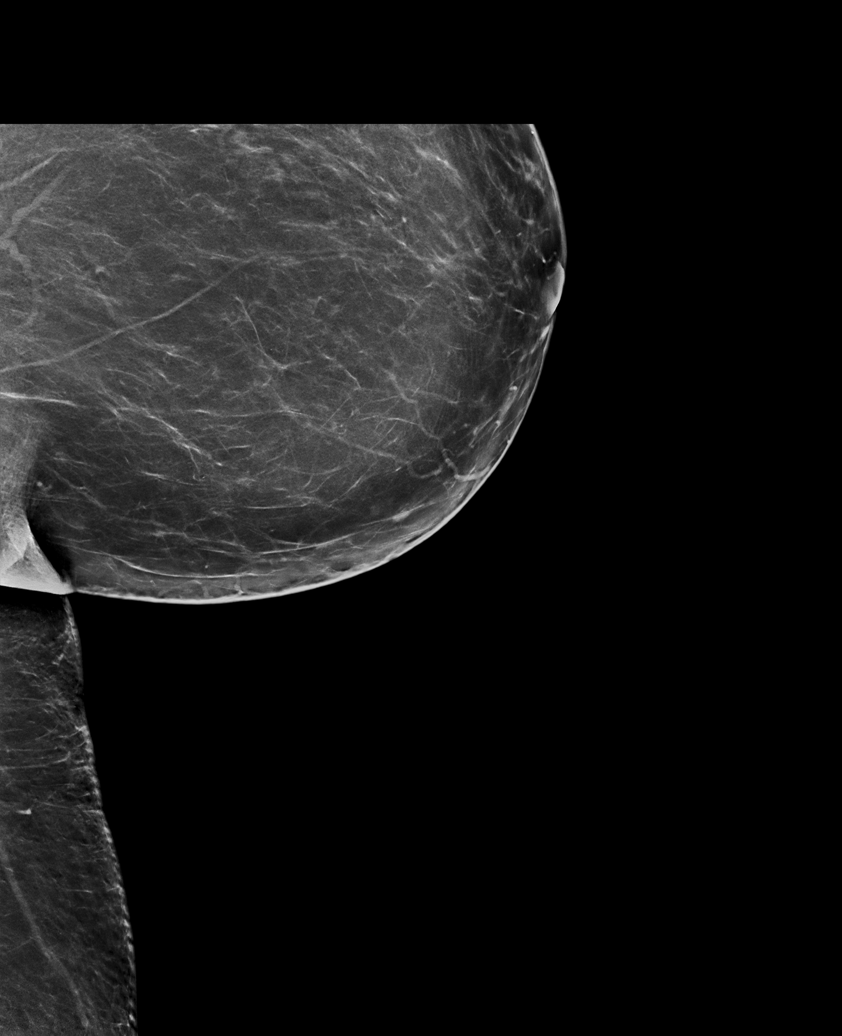

[6 of 36 positions shown; findings below may reference images not displayed]

ACR Breast Density Category b: There are scattered areas of
fibroglandular density.
FINDINGS: There are no findings suspicious for malignancy.
IMPRESSION: No mammographic evidence of malignancy. A result letter of this
screening mammogram will be mailed directly to the patient.

RECOMMENDATION:
Screening mammogram in one year. (Code:51-O-LD2)

BI-RADS CATEGORY  1: Negative.

## 2022-12-21 ENCOUNTER — Other Ambulatory Visit (HOSPITAL_COMMUNITY): Payer: Self-pay | Admitting: Family Medicine

## 2022-12-21 DIAGNOSIS — Z1231 Encounter for screening mammogram for malignant neoplasm of breast: Secondary | ICD-10-CM

## 2023-01-14 ENCOUNTER — Encounter (HOSPITAL_COMMUNITY): Payer: Self-pay

## 2023-01-14 ENCOUNTER — Ambulatory Visit (HOSPITAL_COMMUNITY)
Admission: RE | Admit: 2023-01-14 | Discharge: 2023-01-14 | Disposition: A | Discharge status: Home / Self Care | Payer: Medicare Other | Source: Ambulatory Visit | Attending: Family Medicine | Admitting: Family Medicine

## 2023-01-14 DIAGNOSIS — Z1231 Encounter for screening mammogram for malignant neoplasm of breast: Secondary | ICD-10-CM | POA: Diagnosis not present

## 2023-02-28 ENCOUNTER — Ambulatory Visit: Payer: Medicare Other | Admitting: Urology

## 2023-03-04 ENCOUNTER — Telehealth: Payer: Self-pay

## 2023-03-04 ENCOUNTER — Telehealth: Payer: Self-pay | Admitting: Urology

## 2023-03-04 NOTE — Telephone Encounter (Signed)
No Order for KUB. Patient going today.

## 2023-03-04 NOTE — Telephone Encounter (Signed)
Needs order for KUB called into [facility]

## 2023-03-05 ENCOUNTER — Other Ambulatory Visit: Payer: Self-pay | Admitting: Urology

## 2023-03-05 ENCOUNTER — Ambulatory Visit (HOSPITAL_COMMUNITY)
Admission: RE | Admit: 2023-03-05 | Discharge: 2023-03-05 | Disposition: A | Discharge status: Home / Self Care | Payer: Medicare Other | Source: Ambulatory Visit | Attending: Urology | Admitting: Urology

## 2023-03-05 DIAGNOSIS — N200 Calculus of kidney: Secondary | ICD-10-CM | POA: Insufficient documentation

## 2023-03-05 NOTE — Progress Notes (Unsigned)
Name: Tamara Thompson DOB: Jan 21, 1951 MRN: [id number]  History of Present Illness: Tamara Thompson is a 72 y.o. female who presents today for follow up visit at [facility] [place]. - GU History: 1. Kidney stones.  At last visit with Dr. Annabell Howells on 02/22/2022: Doing well.   Since last visit: > 03/05/2023: KUB done. Awaiting radiology read; left intrarenal stone(s) present. No ureteral stones or right renal stones appreciated.  Today: She denies recent stone passage. She denies flank pain or abdominal pain. She denies fevers, nausea, or vomiting.  She denies increased urinary urgency, frequency, nocturia, dysuria, gross hematuria, hesitancy, straining to void, or sensations of incomplete emptying.   Fall Screening: Do you usually have a device to assist in your mobility? No   Medications: Current Outpatient Medications  Medication Sig Dispense Refill   olmesartan (BENICAR) 40 MG tablet Take 40 mg by mouth daily.     acetaminophen (TYLENOL) 500 MG tablet Take 1,000 mg by mouth daily as needed for moderate pain or headache.     B Complex Vitamins (B COMPLEX PO) Take 0.5 tablets by mouth daily.     Cholecalciferol 2000 units TABS Take 2,000 Units by mouth daily.      Coenzyme Q10 (CO Q 10) 100 MG CAPS Take 100 mg by mouth daily.      ezetimibe (ZETIA) 10 MG tablet Take 10 mg by mouth daily.     ferrous sulfate 325 (65 FE) MG tablet Take 325 mg by mouth 3 (three) times daily.      metFORMIN (GLUCOPHAGE) 500 MG tablet Take 500 mg by mouth 2 (two) times daily.     No current facility-administered medications for this visit.    Allergies: Allergies  Allergen Reactions   Penicillins     Childhood allergy Has patient had a PCN reaction causing immediate rash, facial/tongue/throat swelling, SOB or lightheadedness with hypotension: Unknown Has patient had a PCN reaction causing severe rash involving mucus membranes or skin necrosis: Unknown Has patient had a PCN reaction  that required hospitalization: Unknown Has patient had a PCN reaction occurring within the last 10 years: No If all of the above answers are "NO", then may proceed with Cephalosporin use.    Statins Other (See Comments)    Broke out in rash all over body - mostly torso   Percocet [Oxycodone-Acetaminophen] Rash    Past Medical History:  Diagnosis Date   Anemia    Bleeding from the nose    Deviated septum    Diabetes mellitus without complication ([facility])    History of kidney stones    Kidney stones    Medical history non-contributory    Stroke [facility]) 2019   Past Surgical History:  Procedure Laterality Date   ANKLE SURGERY  2010   left   CESAREAN SECTION     COLONOSCOPY N/A 12/14/2013   Dr. Darrick Penna: 10 colon polyps removed, ascending colon sessile serrated adenoma, hyperplastic polyps. Moderate diverticulosis throughout colon. Left colon redundant   COLONOSCOPY N/A 03/29/2017   Dr. Darrick Penna: multiple serrated adenomas. Surveillance in 3 years    COLONOSCOPY WITH PROPOFOL N/A 05/17/2020   Procedure: COLONOSCOPY WITH PROPOFOL;  Surgeon: Lanelle Bal, DO;  [facility];  Service: Endoscopy;  Laterality: N/A;  9:15am   IR ANGIO INTRA EXTRACRAN SEL COM CAROTID INNOMINATE BILAT MOD SED  01/29/2017   IR ANGIO VERTEBRAL SEL VERTEBRAL BILAT MOD SED  01/29/2017   IR RADIOLOGIST EVAL & MGMT  01/17/2017   IR RADIOLOGIST  EVAL & MGMT  04/12/2017   NASAL ENDOSCOPY WITH EPISTAXIS CONTROL Bilateral 02/07/2015   Procedure: BILATERAL NASAL ENDOSCOPIC CAUTERIZATION;  Surgeon: Newman Pies, MD;  Location: [facility];  Service: ENT;  Laterality: Bilateral;   NASAL SEPTOPLASTY W/ TURBINOPLASTY Bilateral 02/02/2014   Procedure: RIGHT NASAL SEPTOPLASTY WITH TURBINATE REDUCTION;  Surgeon: Darletta Moll, MD;  Location: [facility];  Service: ENT;  Laterality: Bilateral;   OVARIAN CYST SURGERY     POLYPECTOMY Right 02/02/2014   Procedure: RIGHT POLYPECTOMY NASAL;  Surgeon: Darletta Moll, MD;  Location: [facility];  Service: ENT;  Laterality: Right;   POLYPECTOMY  03/29/2017   Procedure: POLYPECTOMY;  Surgeon: West Bali, MD;  [facility];  Service: Endoscopy;;   POLYPECTOMY  05/17/2020   Procedure: POLYPECTOMY;  Surgeon: Lanelle Bal, DO;  [facility];  Service: Endoscopy;;   Surgery of jaw and cheek fracture     Family History  Problem Relation Age of Onset   Heart disease Mother    Cancer Father        lung   Stroke Father    Colon cancer Neg Hx    Social History   Socioeconomic History   Marital status: Married    Spouse name: Not on file   Number of children: Not on file   Years of education: Not on file   Highest education level: Not on file  Occupational History   Occupation: housewife  Tobacco Use   Smoking status: Former    Current packs/day: 0.00    Average packs/day: 1.3 packs/day for 50.0 years (62.5 ttl pk-yrs)    Types: Cigarettes    Start date: 01/28/1960    Quit date: 01/27/2010    Years since quitting: 13.1   Smokeless tobacco: Never  Vaping Use   Vaping status: Never Used  Substance and Sexual Activity   Alcohol use: No   Drug use: No   Sexual activity: Not Currently    Birth control/protection: Post-menopausal  Other Topics Concern   Not on file  Social History Narrative   Not on file   Social Determinants of Health   Financial Resource Strain: Not on file  Food Insecurity: Not on file  Transportation Needs: Not on file  Physical Activity: Not on file  Stress: Not on file  Social Connections: Not on file  Intimate Partner Violence: Not on file    SUBJECTIVE  Review of Systems Constitutional: Patient denies any unintentional weight loss or change in strength lntegumentary: Patient denies any rashes or pruritus Cardiovascular: Patient denies chest pain or syncope Respiratory: Patient denies shortness of breath Gastrointestinal: Patient denies nausea, vomiting,  constipation, or diarrhea Musculoskeletal: Patient denies muscle cramps or weakness Neurologic: Patient denies convulsions or seizures Allergic/Immunologic: Patient denies recent allergic reaction(s) Hematologic/Lymphatic: Patient denies bleeding tendencies Endocrine: Patient denies heat/cold intolerance  GU: As per HPI.  OBJECTIVE Vitals:   03/07/23 1426  BP: (!) 157/80  Pulse: 99  Temp: 98.3 F (36.8 C)   There is no height or weight on file to calculate BMI.  Physical Examination Constitutional: No obvious distress; patient is non-toxic appearing  Cardiovascular: No visible lower extremity edema.  Respiratory: The patient does not have audible wheezing/stridor; respirations do not appear labored  Gastrointestinal: Abdomen non-distended Musculoskeletal: Normal ROM of UEs  Skin: No obvious rashes/open sores  Neurologic: CN 2-12 grossly intact Psychiatric: Answered questions appropriately with normal affect  Hematologic/Lymphatic/Immunologic: No obvious bruises or sites  of spontaneous bleeding  UA: negative   ASSESSMENT Kidney stones - Plan: Urinalysis, Routine w reflex microscopic, DG Abd 1 View  We reviewed recent imaging results; awaiting radiology results, appears to have no acute findings. Will plan to follow up in 1 year with KUB for stone surveillance or sooner if needed. Pt verbalized understanding and agreement. All questions were answered.  PLAN Advised the following: Return in about 1 year (around 03/06/2024) for KUB, UA, & f/u with Evette Georges NP.  Orders Placed This Encounter  Procedures   DG Abd 1 View    Standing Status:   Future    Standing Expiration Date:   03/06/2024    Order Specific Question:   Reason for Exam (SYMPTOM  OR DIAGNOSIS REQUIRED)    Answer:   kidney stone    Order Specific Question:   Preferred imaging location?    Answer:   [facility]   Urinalysis, Routine w reflex microscopic    It has been explained that the patient  is to follow regularly with their PCP in addition to all other providers involved in their care and to follow instructions provided by these respective offices. Patient advised to contact urology clinic if any urologic-pertaining questions, concerns, new symptoms or problems arise in the interim period.  There are no Patient Instructions on file for this visit.  Electronically signed by:  Donnita Falls, MSN, FNP-C, CUNP 03/07/2023 2:43 PM

## 2023-03-05 NOTE — Telephone Encounter (Signed)
Order added by NP

## 2023-03-05 NOTE — Telephone Encounter (Signed)
Ordered by NP

## 2023-03-07 ENCOUNTER — Ambulatory Visit (INDEPENDENT_AMBULATORY_CARE_PROVIDER_SITE_OTHER): Payer: Medicare Other | Admitting: Urology

## 2023-03-07 VITALS — BP 157/80 | HR 99 | Temp 98.3°F

## 2023-03-07 DIAGNOSIS — N200 Calculus of kidney: Secondary | ICD-10-CM | POA: Diagnosis not present

## 2023-03-08 LAB — URINALYSIS, ROUTINE W REFLEX MICROSCOPIC
Bilirubin, UA: NEGATIVE
Glucose, UA: NEGATIVE
Ketones, UA: NEGATIVE
Leukocytes,UA: NEGATIVE
Nitrite, UA: NEGATIVE
Protein,UA: NEGATIVE
RBC, UA: NEGATIVE
Specific Gravity, UA: 1.01 (ref 1.005–1.030)
Urobilinogen, Ur: 0.2 mg/dL (ref 0.2–1.0)
pH, UA: 6 (ref 5.0–7.5)

## 2023-03-29 DIAGNOSIS — Z23 Encounter for immunization: Secondary | ICD-10-CM | POA: Diagnosis not present

## 2023-11-07 ENCOUNTER — Telehealth: Payer: Self-pay

## 2023-11-07 NOTE — Telephone Encounter (Signed)
 Copied from CRM [id number]. Topic: Appointments - Transfer of Care >> Nov 07, 2023 10:34 AM DeAngela L wrote: Pt is requesting to transfer FROM: Dr Norleen General [phone] Pt is requesting to transfer TO: Dr Hilario Terry Iris Lloyd  Reason for requested transfer: previous provider moving  It is the responsibility of the team the patient would like to transfer to (Dr. Hilario Terry Iris Lloyd ) to reach out to the patient if for any reason this transfer is not acceptable.

## 2024-01-16 ENCOUNTER — Ambulatory Visit: Admitting: Nurse Practitioner

## 2024-01-16 ENCOUNTER — Ambulatory Visit: Payer: Self-pay | Admitting: Family Medicine

## 2024-01-22 ENCOUNTER — Other Ambulatory Visit (HOSPITAL_COMMUNITY): Payer: Self-pay | Admitting: Physician Assistant

## 2024-01-22 DIAGNOSIS — Z1231 Encounter for screening mammogram for malignant neoplasm of breast: Secondary | ICD-10-CM

## 2024-02-05 ENCOUNTER — Ambulatory Visit (HOSPITAL_COMMUNITY)
Admission: RE | Admit: 2024-02-05 | Discharge: 2024-02-05 | Disposition: A | Discharge status: Home / Self Care | Source: Ambulatory Visit | Attending: Physician Assistant | Admitting: Physician Assistant

## 2024-02-05 DIAGNOSIS — Z1231 Encounter for screening mammogram for malignant neoplasm of breast: Secondary | ICD-10-CM | POA: Diagnosis not present

## 2024-02-19 ENCOUNTER — Ambulatory Visit (HOSPITAL_COMMUNITY)
Admission: RE | Admit: 2024-02-19 | Discharge: 2024-02-19 | Disposition: A | Discharge status: Home / Self Care | Source: Ambulatory Visit | Attending: Urology | Admitting: Urology

## 2024-02-19 DIAGNOSIS — N200 Calculus of kidney: Secondary | ICD-10-CM | POA: Insufficient documentation

## 2024-02-24 ENCOUNTER — Ambulatory Visit: Admitting: Urology

## 2024-02-24 VITALS — BP 126/80 | HR 99

## 2024-02-24 DIAGNOSIS — N200 Calculus of kidney: Secondary | ICD-10-CM | POA: Diagnosis not present

## 2024-02-24 LAB — URINALYSIS, ROUTINE W REFLEX MICROSCOPIC
Bilirubin, UA: NEGATIVE
Glucose, UA: NEGATIVE
Ketones, UA: NEGATIVE
Leukocytes,UA: NEGATIVE
Nitrite, UA: NEGATIVE
Protein,UA: NEGATIVE
RBC, UA: NEGATIVE
Specific Gravity, UA: <1.005 — ABNORMAL LOW (ref 1.005–1.030)
Urobilinogen, Ur: 0.2 mg/dL (ref 0.2–1.0)
pH, UA: 6 (ref 5.0–7.5)

## 2024-02-24 NOTE — Progress Notes (Unsigned)
 02/24/2024 11:32 AM   Tamara Thompson 27-Oct-1950 [id number]  Referring provider: Marvine Rush, MD [address] [place],  [place] [address]  No chief complaint on file.   HPI:  New patient for me-  1) urolithiasis, renal cyst-Renal US  in 2022 with bilateral renal cysts and stones. No mass or hydronephrosis. Oct 2025 KUB with A 7 mm left renal calculus.  Today, seen for the above.  She is well. No flank pain.   UA is clear.    PMH: Past Medical History:  Diagnosis Date   Anemia    Bleeding from the nose    Deviated septum    Diabetes mellitus without complication (HCC)    History of kidney stones    Kidney stones    Medical history non-contributory    Stroke [facility]) 2019    Surgical History: Past Surgical History:  Procedure Laterality Date   ANKLE SURGERY  2010   left   CESAREAN SECTION     COLONOSCOPY N/A 12/14/2013   Dr. Harvey: 10 colon polyps removed, ascending colon sessile serrated adenoma, hyperplastic polyps. Moderate diverticulosis throughout colon. Left colon redundant   COLONOSCOPY N/A 03/29/2017   Dr. Harvey: multiple serrated adenomas. Surveillance in 3 years    COLONOSCOPY WITH PROPOFOL  N/A 05/17/2020   Procedure: COLONOSCOPY WITH PROPOFOL ;  Surgeon: Cindie Carlin POUR, DO;  [facility];  Service: Endoscopy;  Laterality: N/A;  9:15am   IR ANGIO INTRA EXTRACRAN SEL COM CAROTID INNOMINATE BILAT MOD SED  01/29/2017   IR ANGIO VERTEBRAL SEL VERTEBRAL BILAT MOD SED  01/29/2017   IR RADIOLOGIST EVAL & MGMT  01/17/2017   IR RADIOLOGIST EVAL & MGMT  04/12/2017   NASAL ENDOSCOPY WITH EPISTAXIS CONTROL Bilateral 02/07/2015   Procedure: BILATERAL NASAL ENDOSCOPIC CAUTERIZATION;  Surgeon: Daniel Moccasin, MD;  Location: [facility];  Service: ENT;  Laterality: Bilateral;   NASAL SEPTOPLASTY W/ TURBINOPLASTY Bilateral 02/02/2014   Procedure: RIGHT NASAL SEPTOPLASTY WITH TURBINATE REDUCTION;  Surgeon: Ana LELON Moccasin, MD;  Location: [facility];  Service: ENT;  Laterality: Bilateral;   OVARIAN CYST SURGERY     POLYPECTOMY Right 02/02/2014   Procedure: RIGHT POLYPECTOMY NASAL;  Surgeon: Ana LELON Moccasin, MD;  Location: [facility];  Service: ENT;  Laterality: Right;   POLYPECTOMY  03/29/2017   Procedure: POLYPECTOMY;  Surgeon: Harvey Margo CROME, MD;  [facility];  Service: Endoscopy;;   POLYPECTOMY  05/17/2020   Procedure: POLYPECTOMY;  Surgeon: Cindie Carlin POUR, DO;  [facility];  Service: Endoscopy;;   Surgery of jaw and cheek fracture      Home Medications:  Allergies as of 02/24/2024       Reactions   Penicillins    Childhood allergy Has patient had a PCN reaction causing immediate rash, facial/tongue/throat swelling, SOB or lightheadedness with hypotension: Unknown Has patient had a PCN reaction causing severe rash involving mucus membranes or skin necrosis: Unknown Has patient had a PCN reaction that required hospitalization: Unknown Has patient had a PCN reaction occurring within the last 10 years: No If all of the above answers are NO, then may proceed with Cephalosporin use.   Statins Other (See Comments)   Broke out in rash all over body - mostly torso   Percocet [oxycodone -acetaminophen ] Rash        Medication List        Accurate as of February 24, 2024 11:32 AM. If you have any questions,  ask your nurse or doctor.          acetaminophen  500 MG tablet Commonly known as: TYLENOL  Take 1,000 mg by mouth daily as needed for moderate pain or headache.   B COMPLEX PO Take 0.5 tablets by mouth daily.   Cholecalciferol 50 MCG (2000 UT) Tabs Take 2,000 Units by mouth daily.   Co Q 10 100 MG Caps Take 100 mg by mouth daily.   ezetimibe 10 MG tablet Commonly known as: ZETIA Take 10 mg by mouth daily.   ferrous sulfate  325 (65 FE) MG tablet Take 325 mg by mouth 3 (three) times daily.   metFORMIN 500 MG tablet Commonly known as: GLUCOPHAGE Take 500 mg by mouth 2  (two) times daily.   olmesartan 40 MG tablet Commonly known as: BENICAR Take 40 mg by mouth daily.        Allergies:  Allergies  Allergen Reactions   Penicillins     Childhood allergy Has patient had a PCN reaction causing immediate rash, facial/tongue/throat swelling, SOB or lightheadedness with hypotension: Unknown Has patient had a PCN reaction causing severe rash involving mucus membranes or skin necrosis: Unknown Has patient had a PCN reaction that required hospitalization: Unknown Has patient had a PCN reaction occurring within the last 10 years: No If all of the above answers are NO, then may proceed with Cephalosporin use.    Statins Other (See Comments)    Broke out in rash all over body - mostly torso   Percocet [Oxycodone -Acetaminophen ] Rash    Family History: Family History  Problem Relation Age of Onset   Heart disease Mother    Cancer Father        lung   Stroke Father    Colon cancer Neg Hx     Social History:  reports that she quit smoking about 14 years ago. Her smoking use included cigarettes. She started smoking about 64 years ago. She has a 62.5 pack-year smoking history. She has never used smokeless tobacco. She reports that she does not drink alcohol and does not use drugs.   Physical Exam: BP 126/80   Pulse 99   Constitutional:  Alert and oriented, No acute distress. HEENT: [place] AT, moist mucus membranes.  Trachea midline, no masses. Cardiovascular: No clubbing, cyanosis, or edema. Respiratory: Normal respiratory effort, no increased work of breathing. GI: Abdomen is soft, nontender, nondistended, no abdominal masses GU: No CVA tenderness Skin: No rashes, bruises or suspicious lesions. Neurologic: Grossly intact, no focal deficits, moving all 4 extremities. Psychiatric: Normal mood and affect.  Laboratory Data: Lab Results  Component Value Date   WBC 6.1 04/09/2017   HGB 12.9 04/09/2017   HCT 39.4 04/09/2017   MCV 91.2 04/09/2017   PLT  291 04/09/2017    Lab Results  Component Value Date   CREATININE 0.71 01/29/2017    No results found for: PSA  No results found for: TESTOSTERONE  Lab Results  Component Value Date   HGBA1C 7.2 (H) 10/26/2016    Urinalysis    Component Value Date/Time   COLORURINE YELLOW 10/26/2016 0247   APPEARANCEUR Clear 03/07/2023 1427   LABSPEC 1.013 10/26/2016 0247   PHURINE 6.0 10/26/2016 0247   GLUCOSEU Negative 03/07/2023 1427   HGBUR NEGATIVE 10/26/2016 0247   BILIRUBINUR Negative 03/07/2023 1427   KETONESUR 5 (A) 10/26/2016 0247   PROTEINUR Negative 03/07/2023 1427   PROTEINUR NEGATIVE 10/26/2016 0247   UROBILINOGEN 0.2 11/18/2008 2207   NITRITE Negative 03/07/2023 1427   NITRITE  NEGATIVE 10/26/2016 0247   LEUKOCYTESUR Negative 03/07/2023 1427    Lab Results  Component Value Date   LABMICR Comment 03/07/2023   WBCUA 0-5 02/22/2022   LABEPIT None seen 02/22/2022   BACTERIA None seen 02/22/2022    Pertinent Imaging:  Results for orders placed during the hospital encounter of 02/19/24  DG Abd 1 View  Narrative CLINICAL DATA:  Kidney stone.  EXAM: DG ABDOMEN 1V  COMPARISON:  03/05/2023  FINDINGS: 7 mm left renal calculus. No visible right renal stones. Normal bowel gas pattern, small volume of formed stool throughout the colon.  IMPRESSION: A 7 mm left renal calculus.   Electronically Signed By: Andrea Gasman M.D. On: 02/20/2024 23:01    Results for orders placed during the hospital encounter of 02/16/21  US  RENAL  Narrative CLINICAL DATA:  Follow-up renal ultrasound  EXAM: RENAL / URINARY TRACT ULTRASOUND COMPLETE  COMPARISON:  02/29/2020  FINDINGS: Right Kidney:  Renal measurements: 12.2 x 5.4 x 6.1 cm = volume: 207 mL. Echogenicity within normal limits. Nonobstructive calculi. Simple appearing cysts measuring up to 1.9 cm. No mass or hydronephrosis visualized.  Left Kidney:  Renal measurements: 13.8 x 6.7 x 5.5 cm =  volume: 267 mL. Echogenicity within normal limits. Nonobstructive calculi. Simple appearing cysts measuring up to 3.0 cm. No mass or hydronephrosis visualized.  Bladder:  Appears normal for degree of bladder distention.  Other:  None.  IMPRESSION: 1. Simple appearing bilateral renal cysts. No further follow-up or characterization is required for these benign cysts. 2. Nonobstructive bilateral renal calculi. No hydronephrosis.   Electronically Signed By: Marolyn JONETTA Jaksch M.D. On: 02/18/2021 10:10    Assessment & Plan:    1. Kidney stones (Primary) We discussed the nature risks and benefits of continued surveillance shockwave or ureteroscopy.  She will continue surveillance.  Discussed the stone can grow or pass.  - Urinalysis, Routine w reflex microscopic; Future - Urinalysis, Routine w reflex microscopic   No follow-ups on file.  Donnice Brooks, MD  [facility]  [address] [place], [place] [address] [phone]

## 2024-02-28 ENCOUNTER — Ambulatory Visit (INDEPENDENT_AMBULATORY_CARE_PROVIDER_SITE_OTHER): Admitting: Physician Assistant

## 2024-02-28 ENCOUNTER — Encounter: Payer: Self-pay | Admitting: Physician Assistant

## 2024-02-28 VITALS — BP 143/81 | HR 86 | Temp 97.7°F | Ht 65.0 in | Wt 149.2 lb

## 2024-02-28 DIAGNOSIS — E782 Mixed hyperlipidemia: Secondary | ICD-10-CM | POA: Diagnosis not present

## 2024-02-28 DIAGNOSIS — E119 Type 2 diabetes mellitus without complications: Secondary | ICD-10-CM | POA: Diagnosis not present

## 2024-02-28 DIAGNOSIS — Z23 Encounter for immunization: Secondary | ICD-10-CM

## 2024-02-28 DIAGNOSIS — I10 Essential (primary) hypertension: Secondary | ICD-10-CM

## 2024-02-28 DIAGNOSIS — L659 Nonscarring hair loss, unspecified: Secondary | ICD-10-CM | POA: Diagnosis not present

## 2024-02-28 DIAGNOSIS — Z7689 Persons encountering health services in other specified circumstances: Secondary | ICD-10-CM

## 2024-02-28 DIAGNOSIS — Z7984 Long term (current) use of oral hypoglycemic drugs: Secondary | ICD-10-CM | POA: Diagnosis not present

## 2024-02-28 MED ORDER — OLMESARTAN MEDOXOMIL 40 MG PO TABS: 40.0000 mg | ORAL_TABLET | Freq: Every day | ORAL | 1 refills | Status: AC

## 2024-02-28 MED ORDER — FERROUS SULFATE 325 (65 FE) MG PO TABS: 325.0000 mg | ORAL_TABLET | Freq: Three times a day (TID) | ORAL | 1 refills | Status: AC

## 2024-02-28 NOTE — Progress Notes (Signed)
 New Patient Office Visit  Subjective    Patient ID: Tamara Thompson, female    DOB: 1950/12/03  Age: 73 y.o. MRN: [id number]  CC:  Chief Complaint  Patient presents with   New Patient (Initial Visit)    Patient is a new patient. She mentioned having a stopped up ear for a little while now. Wanting that looked at.  No other concerns were addressed.     HPI Tamara Thompson presents to establish care  Discussed the use of AI scribe software for clinical note transcription with the patient, who gave verbal consent to proceed.  History of Present Illness Tamara Thompson is a 73 year old female with hypertension, hyperlipidemia, and diabetes who presents with a stuffed left ear and hearing loss.  She experiences a stuffed sensation in her left ear with associated hearing loss for the past one to two months. There is no pain, but the ear feels 'stopped up' with difficulty hearing.  She is concerned about hair loss and seeks to explore potential causes, wondering if this is related to old age.  Her current medications include olmesartan and ferrous sulfate , for which she requests refills. She takes metformin twice daily for diabetes management. She takes vitamin D as a supplement.  Her past medical history includes hypertension, hyperlipidemia, diabetes, iron deficiency anemia, a broken ankle, ovarian cyst surgery approximately ten to twelve years ago, and a 'semi stroke' a few years ago.  She quit smoking approximately fifteen years ago after smoking a pack a day since she was [age] or [age]. She denies current alcohol or drug use.  She denies shortness of breath, chest pains, headaches, numbness, or tingling in the feet.   Outpatient Encounter Medications as of 02/28/2024  Medication Sig   acetaminophen  (TYLENOL ) 500 MG tablet Take 1,000 mg by mouth daily as needed for moderate pain or headache.   B Complex Vitamins (B COMPLEX PO) Take 0.5 tablets by mouth daily.    Cholecalciferol 2000 units TABS Take 2,000 Units by mouth daily.    Coenzyme Q10 (CO Q 10) 100 MG CAPS Take 100 mg by mouth daily.    ezetimibe (ZETIA) 10 MG tablet Take 10 mg by mouth daily.   metFORMIN (GLUCOPHAGE) 500 MG tablet Take 500 mg by mouth 2 (two) times daily.   [DISCONTINUED] ferrous sulfate  325 (65 FE) MG tablet Take 325 mg by mouth 3 (three) times daily.    [DISCONTINUED] olmesartan (BENICAR) 40 MG tablet Take 40 mg by mouth daily.   ferrous sulfate  325 (65 FE) MG tablet Take 1 tablet (325 mg total) by mouth 3 (three) times daily.   olmesartan (BENICAR) 40 MG tablet Take 1 tablet (40 mg total) by mouth daily.   No facility-administered encounter medications on file as of 02/28/2024.    Past Medical History:  Diagnosis Date   Anemia    Bleeding from the nose    Deviated septum    Diabetes mellitus without complication (HCC)    History of kidney stones    Kidney stones    Medical history non-contributory    Stroke [facility]) 2019    Past Surgical History:  Procedure Laterality Date   ANKLE SURGERY  2010   left   CESAREAN SECTION     COLONOSCOPY N/A 12/14/2013   Dr. Harvey: 10 colon polyps removed, ascending colon sessile serrated adenoma, hyperplastic polyps. Moderate diverticulosis throughout colon. Left colon redundant   COLONOSCOPY N/A 03/29/2017   Dr. Harvey: multiple serrated adenomas. Surveillance  in 3 years    COLONOSCOPY WITH PROPOFOL  N/A 05/17/2020   Procedure: COLONOSCOPY WITH PROPOFOL ;  Surgeon: Cindie Carlin POUR, DO;  [facility];  Service: Endoscopy;  Laterality: N/A;  9:15am   IR ANGIO INTRA EXTRACRAN SEL COM CAROTID INNOMINATE BILAT MOD SED  01/29/2017   IR ANGIO VERTEBRAL SEL VERTEBRAL BILAT MOD SED  01/29/2017   IR RADIOLOGIST EVAL & MGMT  01/17/2017   IR RADIOLOGIST EVAL & MGMT  04/12/2017   NASAL ENDOSCOPY WITH EPISTAXIS CONTROL Bilateral 02/07/2015   Procedure: BILATERAL NASAL ENDOSCOPIC CAUTERIZATION;  Surgeon: Daniel Moccasin, MD;  Location: [facility];  Service: ENT;  Laterality: Bilateral;   NASAL SEPTOPLASTY W/ TURBINOPLASTY Bilateral 02/02/2014   Procedure: RIGHT NASAL SEPTOPLASTY WITH TURBINATE REDUCTION;  Surgeon: Ana LELON Moccasin, MD;  Location: [facility];  Service: ENT;  Laterality: Bilateral;   OVARIAN CYST SURGERY     POLYPECTOMY Right 02/02/2014   Procedure: RIGHT POLYPECTOMY NASAL;  Surgeon: Ana LELON Moccasin, MD;  [facility];  Service: ENT;  Laterality: Right;   POLYPECTOMY  03/29/2017   Procedure: POLYPECTOMY;  Surgeon: Harvey Margo CROME, MD;  [facility];  Service: Endoscopy;;   POLYPECTOMY  05/17/2020   Procedure: POLYPECTOMY;  Surgeon: Cindie Carlin POUR, DO;  [facility];  Service: Endoscopy;;   Surgery of jaw and cheek fracture      Family History  Problem Relation Age of Onset   Heart disease Mother    Cancer Father        lung   Stroke Father    Colon cancer Neg Hx     Social History   Socioeconomic History   Marital status: Married    Spouse name: Not on file   Number of children: Not on file   Years of education: Not on file   Highest education level: 12th grade  Occupational History   Occupation: housewife  Tobacco Use   Smoking status: Former    Current packs/day: 0.00    Average packs/day: 1.3 packs/day for 50.0 years (62.5 ttl pk-yrs)    Types: Cigarettes    Start date: 01/28/1960    Quit date: 01/27/2010    Years since quitting: 14.0   Smokeless tobacco: Never  Vaping Use   Vaping status: Never Used  Substance and Sexual Activity   Alcohol use: No   Drug use: No   Sexual activity: Not Currently    Birth control/protection: Post-menopausal  Other Topics Concern   Not on file  Social History Narrative   Not on file   Social Drivers of Health   Financial Resource Strain: Low Risk  (01/13/2024)   Overall Financial Resource Strain (CARDIA)    Difficulty of Paying Living Expenses: Not very hard  Food Insecurity: No Food  Insecurity (01/13/2024)   Hunger Vital Sign    Worried About Running Out of Food in the Last Year: Never true    Ran Out of Food in the Last Year: Never true  Transportation Needs: No Transportation Needs (01/13/2024)   PRAPARE - Transportation    Lack of Transportation (Medical): No    Lack of Transportation (Non-Medical): No  Physical Activity: Insufficiently Active (01/13/2024)   Exercise Vital Sign    Days of Exercise per Week: 3 days    Minutes of Exercise per Session: 30 min  Stress: No Stress Concern Present (01/13/2024)   [employer] of Occupational Health - Occupational Stress Questionnaire    Feeling  of Stress: Not at all  Social Connections: Moderately Isolated (01/13/2024)   Social Connection and Isolation Panel    Frequency of Communication with Friends and Family: More than three times a week    Frequency of Social Gatherings with Friends and Family: Three times a week    Attends Religious Services: Never    Active Member of Clubs or Organizations: No    Attends Engineer, Structural: Not on file    Marital Status: Married  Catering Manager Violence: Not on file    Review of Systems  Constitutional:  Negative for chills, fever and malaise/fatigue.  HENT:         Ear pressure   Eyes:  Negative for blurred vision and double vision.  Respiratory:  Negative for cough and shortness of breath.   Cardiovascular:  Negative for chest pain and palpitations.  Musculoskeletal:  Negative for joint pain and myalgias.  Neurological:  Negative for dizziness and headaches.        Objective    BP (!) 143/81   Pulse 86   Temp 97.7 F (36.5 C)   Ht 5' 5 (1.651 m)   Wt 149 lb 4 oz (67.7 kg)   SpO2 99%   BMI 24.84 kg/m   Physical Exam Constitutional:      General: She is not in acute distress.    Appearance: Normal appearance. She is not ill-appearing.  HENT:     Head: Normocephalic and atraumatic.     Right Ear: Tympanic membrane and external ear normal.      Left Ear: Tympanic membrane and external ear normal.     Ears:     Comments: Left TM with moderate cerumen, TM still visualized     Mouth/Throat:     Mouth: Mucous membranes are moist.     Pharynx: Oropharynx is clear.  Eyes:     Extraocular Movements: Extraocular movements intact.     Conjunctiva/sclera: Conjunctivae normal.  Cardiovascular:     Rate and Rhythm: Normal rate and regular rhythm.     Heart sounds: Normal heart sounds. No murmur heard. Pulmonary:     Effort: Pulmonary effort is normal.     Breath sounds: Normal breath sounds.  Musculoskeletal:     Right lower leg: No edema.     Left lower leg: No edema.  Skin:    General: Skin is warm and dry.  Neurological:     General: No focal deficit present.     Mental Status: She is alert and oriented to person, place, and time.  Psychiatric:        Mood and Affect: Mood normal.        Behavior: Behavior normal.        Assessment & Plan:  Encounter to establish care  Primary hypertension Assessment & Plan: 151/83, 143/81 Uncontrolled. Continue current medications. No change in management. Discussed DASH diet and dietary sodium restrictions.  Increase dietary efforts and physical activity. Follow up in 1 month for re-evaluation, or sooner for new chest pain, shortness of breath, headache, or visual changes.   Orders: -     Comprehensive metabolic panel with GFR -     CBC with Differential/Platelet -     Microalbumin / creatinine urine ratio -     Olmesartan Medoxomil; Take 1 tablet (40 mg total) by mouth daily.  Dispense: 90 tablet; Refill: 1  Type 2 diabetes mellitus without complication, without long-term current use of insulin  (HCC) Assessment & Plan: Controlled, A1c in  June 2024 was 4.8. Continue current management.  Patient is on Zetia as she has allergy to statin.  Lipid panel, A1c, and urine ACR today.  Increase dietary efforts and physical activity. Routine diabetic retinopathy screening:  overdue, patient unsure of insurance coverage.  Foot exam and monofilament test done.   Orders: -     Hemoglobin A1c -     Comprehensive metabolic panel with GFR -     CBC with Differential/Platelet -     Lipid panel -     Microalbumin / creatinine urine ratio  Mixed hyperlipidemia Assessment & Plan: Stable. Continue with current management without changes. Discussed healthy diet and lifestyle.   Orders: -     Lipid panel  Hair loss -     TSH + free T4  Immunization due -     Flu vaccine HIGH DOSE PF(Fluzone Trivalent)  Other orders -     Ferrous Sulfate ; Take 1 tablet (325 mg total) by mouth 3 (three) times daily.  Dispense: 270 tablet; Refill: 1   Return in about 4 weeks (around 03/27/2024) for BP check .   Charmaine Ahja Martello, PA-C

## 2024-02-28 NOTE — Assessment & Plan Note (Signed)
 Stable. Continue with current management without changes. Discussed healthy diet and lifestyle.

## 2024-02-28 NOTE — Assessment & Plan Note (Signed)
 151/83, 143/81 Uncontrolled. Continue current medications. No change in management. Discussed DASH diet and dietary sodium restrictions.  Increase dietary efforts and physical activity. Follow up in 1 month for re-evaluation, or sooner for new chest pain, shortness of breath, headache, or visual changes.

## 2024-02-28 NOTE — Assessment & Plan Note (Signed)
 Controlled, A1c in June 2024 was 4.8. Continue current management.  Patient is on Zetia as she has allergy to statin.  Lipid panel, A1c, and urine ACR today.  Increase dietary efforts and physical activity. Routine diabetic retinopathy screening: overdue, patient unsure of insurance coverage.  Foot exam and monofilament test done.

## 2024-03-01 LAB — CBC WITH DIFFERENTIAL/PLATELET
Basophils Absolute: 0.1 x10E3/uL (ref 0.0–0.2)
Basos: 1 %
EOS (ABSOLUTE): 0.3 x10E3/uL (ref 0.0–0.4)
Eos: 4 %
Hematocrit: 36.5 % (ref 34.0–46.6)
Hemoglobin: 11.8 g/dL (ref 11.1–15.9)
Immature Grans (Abs): 0 x10E3/uL (ref 0.0–0.1)
Immature Granulocytes: 0 %
Lymphocytes Absolute: 0.9 x10E3/uL (ref 0.7–3.1)
Lymphs: 14 %
MCH: 30.3 pg (ref 26.6–33.0)
MCHC: 32.3 g/dL (ref 31.5–35.7)
MCV: 94 fL (ref 79–97)
Monocytes Absolute: 0.8 x10E3/uL (ref 0.1–0.9)
Monocytes: 12 %
Neutrophils Absolute: 4.6 x10E3/uL (ref 1.4–7.0)
Neutrophils: 69 %
Platelets: 381 x10E3/uL (ref 150–450)
RBC: 3.9 x10E6/uL (ref 3.77–5.28)
RDW: 13.2 % (ref 11.7–15.4)
WBC: 6.7 x10E3/uL (ref 3.4–10.8)

## 2024-03-01 LAB — TSH+FREE T4
Free T4: 1.25 ng/dL (ref 0.82–1.77)
TSH: 1.52 u[IU]/mL (ref 0.450–4.500)

## 2024-03-01 LAB — LIPID PANEL
Chol/HDL Ratio: 3.8 ratio (ref 0.0–4.4)
Cholesterol, Total: 160 mg/dL (ref 100–199)
HDL: 42 mg/dL (ref >39)
LDL Chol Calc (NIH): 91 mg/dL (ref 0–99)
Triglycerides: 154 mg/dL — ABNORMAL HIGH (ref 0–149)
VLDL Cholesterol Cal: 27 mg/dL (ref 5–40)

## 2024-03-01 LAB — MICROALBUMIN / CREATININE URINE RATIO
Creatinine, Urine: 19.5 mg/dL
Microalb/Creat Ratio: 32 mg/g{creat} — AB (ref 0–29)
Microalbumin, Urine: 6.3 ug/mL

## 2024-03-01 LAB — HEMOGLOBIN A1C
Est. average glucose Bld gHb Est-mCnc: 97 mg/dL
Hgb A1c MFr Bld: 5 % (ref 4.8–5.6)

## 2024-03-02 ENCOUNTER — Ambulatory Visit: Payer: Self-pay | Admitting: Physician Assistant

## 2024-03-05 ENCOUNTER — Ambulatory Visit: Payer: Medicare Other | Admitting: Urology

## 2024-03-09 ENCOUNTER — Other Ambulatory Visit: Payer: Self-pay | Admitting: Physician Assistant

## 2024-03-09 NOTE — Telephone Encounter (Unsigned)
 Copied from CRM #[id number]. Topic: Clinical - Medication Refill >> Mar 09, 2024  9:12 AM Tanazia G wrote: Medication:  ezetimibe (ZETIA) 10 MG tablet  Has the patient contacted their pharmacy? Yes (Agent: If no, request that the patient contact the pharmacy for the refill. If patient does not wish to contact the pharmacy document the reason why and proceed with request.) (Agent: If yes, when and what did the pharmacy advise?)  This is the patient's preferred pharmacy:   [facility] [address], [place] - [address] [place] [address] Phone: [phone] Fax: [phone]  Is this the correct pharmacy for this prescription? Yes If no, delete pharmacy and type the correct one.   Has the prescription been filled recently? Yes  Is the patient out of the medication? Yes  Has the patient been seen for an appointment in the last year OR does the patient have an upcoming appointment? Yes  Can we respond through MyChart? No  Agent: Please be advised that Rx refills may take up to 3 business days. We ask that you follow-up with your pharmacy.

## 2024-03-10 MED ORDER — EZETIMIBE 10 MG PO TABS: 10.0000 mg | ORAL_TABLET | Freq: Every day | ORAL | 1 refills | Status: DC

## 2024-03-12 ENCOUNTER — Other Ambulatory Visit: Payer: Self-pay

## 2024-03-12 ENCOUNTER — Telehealth: Payer: Self-pay

## 2024-03-12 MED ORDER — EZETIMIBE 10 MG PO TABS: 10.0000 mg | ORAL_TABLET | Freq: Every day | ORAL | 1 refills | Status: AC

## 2024-03-12 NOTE — Telephone Encounter (Signed)
 Copied from CRM [id number]. Topic: Clinical - Prescription Issue >> Mar 12, 2024 11:16 AM [name] wrote: Reason for CRM: Patient called to report that her ezetimibe (Zetia) 10 mg tablet prescription, originally placed on 11/18, was sent to the wrong pharmacy [facility]). The patient only has one pill left.Please resend the prescription to [facility] at [address], [place], [place] [address].

## 2024-04-01 ENCOUNTER — Ambulatory Visit: Admitting: Physician Assistant

## 2024-04-01 ENCOUNTER — Encounter: Payer: Self-pay | Admitting: Physician Assistant

## 2024-04-01 VITALS — BP 138/78 | HR 91 | Temp 97.6°F | Ht 65.0 in | Wt 151.2 lb

## 2024-04-01 DIAGNOSIS — I10 Essential (primary) hypertension: Secondary | ICD-10-CM

## 2024-04-01 NOTE — Progress Notes (Signed)
 Established Patient Office Visit  Subjective   Patient ID: Tamara Thompson, female    DOB: 1950/05/22  Age: 73 y.o. MRN: [id number]  Chief Complaint  Patient presents with   Follow-up    Patient is here for a blood pressure follow up    Hypertension, follow-up  BP Readings from Last 3 Encounters:  04/01/24 138/78  02/28/24 (!) 143/81  02/24/24 126/80   Wt Readings from Last 3 Encounters:  04/01/24 151 lb 4 oz (68.6 kg)  02/28/24 149 lb 4 oz (67.7 kg)  08/29/21 172 lb 9.6 oz (78.3 kg)     She was last seen for hypertension 1 months ago.  BP at that visit was 143/81. Management since that visit includes lifestyle modifications.  She reports good compliance with treatment. She is following a Regular diet. She is not exercising. She does not smoke.  Use of agents associated with hypertension: none.   Outside blood pressures are on average 125/75 at home.   Symptoms: No chest pain No chest pressure  No palpitations No syncope  No dyspnea No orthopnea  No paroxysmal nocturnal dyspnea No lower extremity edema   Pertinent labs Lab Results  Component Value Date   CHOL 160 02/28/2024   HDL 42 02/28/2024   LDLCALC 91 02/28/2024   TRIG 154 (H) 02/28/2024   CHOLHDL 3.8 02/28/2024   Lab Results  Component Value Date   NA 139 01/29/2017   K 4.0 01/29/2017   CREATININE 0.71 01/29/2017   GFRNONAA >60 01/29/2017   GLUCOSE 131 (H) 01/29/2017   TSH 1.520 02/28/2024     The ASCVD Risk score (Arnett DK, et al., 2019) failed to calculate for the following reasons:   Risk score cannot be calculated because patient has a medical history suggesting prior/existing ASCVD    ROS per HPI     Objective:     BP 138/78 (BP Location: Left Arm, Patient Position: Sitting)   Pulse 91   Temp 97.6 F (36.4 C)   Ht 5' 5 (1.651 m)   Wt 151 lb 4 oz (68.6 kg)   SpO2 98%   BMI 25.17 kg/m    Physical Exam Constitutional:      General: She is not in acute distress.     Appearance: Normal appearance. She is not ill-appearing.  HENT:     Head: Normocephalic and atraumatic.     Mouth/Throat:     Mouth: Mucous membranes are moist.     Pharynx: Oropharynx is clear.  Eyes:     Extraocular Movements: Extraocular movements intact.     Conjunctiva/sclera: Conjunctivae normal.  Cardiovascular:     Rate and Rhythm: Normal rate and regular rhythm.     Heart sounds: Normal heart sounds. No murmur heard. Pulmonary:     Effort: Pulmonary effort is normal.     Breath sounds: Normal breath sounds.  Musculoskeletal:     Right lower leg: No edema.     Left lower leg: No edema.  Skin:    General: Skin is warm and dry.  Neurological:     General: No focal deficit present.     Mental Status: She is alert and oriented to person, place, and time.  Psychiatric:        Mood and Affect: Mood normal.        Behavior: Behavior normal.     No results found for any visits on 04/01/24.  The ASCVD Risk score (Arnett DK, et al., 2019) failed to calculate for  the following reasons:   Risk score cannot be calculated because patient has a medical history suggesting prior/existing ASCVD    Assessment & Plan:   Return in about 5 months (around 08/30/2024) for gneral f/u .   Primary hypertension Assessment & Plan: 138/78 with good at home readings. Controlled. Continue current medications. No change in management. Discussed DASH diet and dietary sodium restrictions.  Continue dietary efforts and physical activity.      Charmaine Brittnie Lewey, PA-C

## 2024-04-01 NOTE — Assessment & Plan Note (Signed)
 138/78 with good at home readings. Controlled. Continue current medications. No change in management. Discussed DASH diet and dietary sodium restrictions.  Continue dietary efforts and physical activity.

## 2024-04-23 ENCOUNTER — Encounter: Payer: Self-pay | Admitting: Gastroenterology

## 2024-05-12 ENCOUNTER — Other Ambulatory Visit: Payer: Self-pay

## 2024-05-12 MED ORDER — METFORMIN HCL 500 MG PO TABS: 500.0000 mg | ORAL_TABLET | Freq: Two times a day (BID) | ORAL | 2 refills | Status: AC

## 2024-06-25 ENCOUNTER — Ambulatory Visit

## 2024-08-31 ENCOUNTER — Ambulatory Visit: Admitting: Physician Assistant

## 2025-03-08 ENCOUNTER — Ambulatory Visit: Admitting: Urology
